# Patient Record
Sex: Female | Born: 1965 | Race: White | Hispanic: No | Marital: Married | Smoking: Never smoker
Health system: Southern US, Community
[De-identification: ages and names within clinical notes are randomized; demographics above are authoritative.]

## PROBLEM LIST (undated history)

## (undated) DIAGNOSIS — E119 Type 2 diabetes mellitus without complications: Secondary | ICD-10-CM

## (undated) DIAGNOSIS — I2089 Other forms of angina pectoris: Secondary | ICD-10-CM

## (undated) DIAGNOSIS — E785 Hyperlipidemia, unspecified: Secondary | ICD-10-CM

## (undated) DIAGNOSIS — E7800 Pure hypercholesterolemia, unspecified: Secondary | ICD-10-CM

## (undated) DIAGNOSIS — F419 Anxiety disorder, unspecified: Secondary | ICD-10-CM

## (undated) DIAGNOSIS — R079 Chest pain, unspecified: Secondary | ICD-10-CM

## (undated) DIAGNOSIS — M5412 Radiculopathy, cervical region: Secondary | ICD-10-CM

## (undated) DIAGNOSIS — Z8679 Personal history of other diseases of the circulatory system: Secondary | ICD-10-CM

## (undated) DIAGNOSIS — I208 Other forms of angina pectoris: Secondary | ICD-10-CM

## (undated) DIAGNOSIS — G459 Transient cerebral ischemic attack, unspecified: Secondary | ICD-10-CM

## (undated) DIAGNOSIS — G43909 Migraine, unspecified, not intractable, without status migrainosus: Secondary | ICD-10-CM

## (undated) HISTORY — DX: Hyperlipidemia, unspecified: E78.5

## (undated) HISTORY — DX: Chest pain, unspecified: R07.9

## (undated) HISTORY — DX: Anxiety disorder, unspecified: F41.9

## (undated) HISTORY — DX: Migraine, unspecified, not intractable, without status migrainosus: G43.909

## (undated) HISTORY — DX: Other forms of angina pectoris: I20.89

## (undated) HISTORY — PX: CHOLECYSTECTOMY: SHX55

## (undated) HISTORY — DX: Other forms of angina pectoris: I20.8

## (undated) HISTORY — DX: Transient cerebral ischemic attack, unspecified: G45.9

## (undated) HISTORY — DX: Pure hypercholesterolemia, unspecified: E78.00

## (undated) HISTORY — DX: Personal history of other diseases of the circulatory system: Z86.79

## (undated) HISTORY — DX: Radiculopathy, cervical region: M54.12

## (undated) HISTORY — PX: BUNIONECTOMY: SHX129

---

## 2019-05-17 DIAGNOSIS — I10 Essential (primary) hypertension: Secondary | ICD-10-CM | POA: Diagnosis present

## 2019-05-17 DIAGNOSIS — F419 Anxiety disorder, unspecified: Secondary | ICD-10-CM | POA: Diagnosis present

## 2020-07-25 ENCOUNTER — Other Ambulatory Visit: Payer: Self-pay

## 2020-07-25 ENCOUNTER — Encounter (HOSPITAL_COMMUNITY): Payer: Self-pay | Admitting: Emergency Medicine

## 2020-07-25 ENCOUNTER — Emergency Department (HOSPITAL_COMMUNITY)
Admission: EM | Admit: 2020-07-25 | Discharge: 2020-07-26 | Disposition: A | Discharge status: Home / Self Care | Payer: 59 | Attending: Emergency Medicine | Admitting: Emergency Medicine

## 2020-07-25 DIAGNOSIS — E119 Type 2 diabetes mellitus without complications: Secondary | ICD-10-CM | POA: Insufficient documentation

## 2020-07-25 DIAGNOSIS — R519 Headache, unspecified: Secondary | ICD-10-CM | POA: Diagnosis present

## 2020-07-25 HISTORY — DX: Type 2 diabetes mellitus without complications: E11.9

## 2020-07-25 HISTORY — DX: Transient cerebral ischemic attack, unspecified: G45.9

## 2020-07-25 LAB — COMPREHENSIVE METABOLIC PANEL
ALT: 49 U/L — ABNORMAL HIGH (ref 0–44)
AST: 27 U/L (ref 15–41)
Albumin: 4.1 g/dL (ref 3.5–5.0)
Alkaline Phosphatase: 59 U/L (ref 38–126)
Anion gap: 8 (ref 5–15)
BUN: 14 mg/dL (ref 6–20)
CO2: 27 mmol/L (ref 22–32)
Calcium: 9.8 mg/dL (ref 8.9–10.3)
Chloride: 102 mmol/L (ref 98–111)
Creatinine, Ser: 0.68 mg/dL (ref 0.44–1.00)
GFR, Estimated: >60 mL/min (ref >60)
Glucose, Bld: 110 mg/dL — ABNORMAL HIGH (ref 70–99)
Potassium: 4 mmol/L (ref 3.5–5.1)
Sodium: 137 mmol/L (ref 135–145)
Total Bilirubin: 0.5 mg/dL (ref 0.3–1.2)
Total Protein: 7.3 g/dL (ref 6.5–8.1)

## 2020-07-25 LAB — CBC WITH DIFFERENTIAL/PLATELET
Abs Immature Granulocytes: 0.04 10*3/uL (ref 0.00–0.07)
Basophils Absolute: 0 10*3/uL (ref 0.0–0.1)
Basophils Relative: 0 %
Eosinophils Absolute: 0.2 10*3/uL (ref 0.0–0.5)
Eosinophils Relative: 2 %
HCT: 43.8 % (ref 36.0–46.0)
Hemoglobin: 14.4 g/dL (ref 12.0–15.0)
Immature Granulocytes: 1 %
Lymphocytes Relative: 40 %
Lymphs Abs: 3.4 10*3/uL (ref 0.7–4.0)
MCH: 28.2 pg (ref 26.0–34.0)
MCHC: 32.9 g/dL (ref 30.0–36.0)
MCV: 85.9 fL (ref 80.0–100.0)
Monocytes Absolute: 0.5 10*3/uL (ref 0.1–1.0)
Monocytes Relative: 6 %
Neutro Abs: 4.4 10*3/uL (ref 1.7–7.7)
Neutrophils Relative %: 51 %
Platelets: 289 10*3/uL (ref 150–400)
RBC: 5.1 MIL/uL (ref 3.87–5.11)
RDW: 13.7 % (ref 11.5–15.5)
WBC: 8.5 10*3/uL (ref 4.0–10.5)
nRBC: 0 % (ref 0.0–0.2)

## 2020-07-25 MED ORDER — SODIUM CHLORIDE 0.9 % IV BOLUS
1000.0000 mL | Freq: Once | INTRAVENOUS | Status: AC
  Administered 2020-07-26: 1000 mL via INTRAVENOUS

## 2020-07-25 MED ORDER — DIPHENHYDRAMINE HCL 50 MG/ML IJ SOLN
12.5000 mg | Freq: Once | INTRAMUSCULAR | Status: AC
  Administered 2020-07-26: 12.5 mg via INTRAVENOUS
  Filled 2020-07-25: qty 1

## 2020-07-25 MED ORDER — PROCHLORPERAZINE EDISYLATE 10 MG/2ML IJ SOLN
10.0000 mg | Freq: Once | INTRAMUSCULAR | Status: AC
  Administered 2020-07-26: 10 mg via INTRAVENOUS
  Filled 2020-07-25: qty 2

## 2020-07-25 NOTE — ED Provider Notes (Signed)
Emergency Medicine Provider Triage Evaluation Note  Hailey [name] , a 55 y.o. female  was evaluated in triage.  Pt complains of 2-day history of headache, right arm paresthesias, elevated blood pressures, mild lightheadedness and intermittent blurred vision, and nausea symptoms.  She states that it does not get worse headache of her life.  Denies vomiting.  She recently moved here from [place].  She reports that she has a history of TIAs x2.  She has been followed by neurology and had reassuring work-up.  However, reports migraine history.  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  Her right arm paresthesias as a "burning" discomfort is worse with certain movements.  She reports that she was taken off of her BP meds, controlled with lifestyle modifications.   She has tried Advil at home, without significant improvement.  No fevers or chills.  Review of Systems  Positive: HA, right arm paresthesias, lightheadedness.  Negative: Vomiting, worst HA of life, recent trauma, blood thinners. Fevers or chills.  Physical Exam  BP 129/79 (BP Location: Right Arm)   Pulse 89   Temp 98.2 F (36.8 C) (Oral)   Resp 15   Ht 5\' 1"  (1.549 m)   Wt 105 kg   SpO2 96%   BMI 43.74 kg/m  Gen:   Awake, no distress   Resp:  Normal effort  MSK:   Moves extremities without difficulty  Other:  Neuro: CN II to XII grossly intact.  PERRL and EOM intact.  No facial asymmetry.  Extremity strength and sensation intact and symmetric throughout.  Movement of right arm reproduces right forearm paresthesias  Medical Decision Making  Medically screening exam initiated at 8:09 PM.  Appropriate orders placed.  Davona Kinoshita was informed that the remainder of the evaluation will be completed by another provider, this initial triage assessment does not replace that evaluation, and the importance of remaining in the ED until their evaluation is complete.  Labs. HA cocktail.  Suspect her right forearm paresthesias more related to her cervical disc herniations. States that it radiates from right shoulder.   Whitney Muse, PA-C 07/25/20 2009    07/27/20, MD 07/25/20 2032

## 2020-07-25 NOTE — ED Triage Notes (Signed)
Patient presents with multiple complaints : headache for 2 days ; intermittent numbness at right forearm this afternoon ; left lowr back pain radiating to left leg and elevated blood pressure this afternoon 165/92 .

## 2020-07-25 NOTE — ED Notes (Signed)
Pt named called for vitals and no answer

## 2020-07-26 ENCOUNTER — Emergency Department (HOSPITAL_COMMUNITY): Payer: 59

## 2020-07-26 MED ORDER — DEXAMETHASONE SODIUM PHOSPHATE 10 MG/ML IJ SOLN
10.0000 mg | Freq: Once | INTRAMUSCULAR | Status: AC
  Administered 2020-07-26: 10 mg via INTRAVENOUS
  Filled 2020-07-26: qty 1

## 2020-07-26 NOTE — ED Provider Notes (Signed)
[facility] EMERGENCY DEPARTMENT Provider Note   CSN: [id number] Arrival date & time: 07/25/20  1835     History Chief Complaint  Patient presents with   Headache    Numbness    Hailey [name] is a 55 y.o. female.   Headache Pain location:  Generalized Quality:  Dull Radiates to:  Does not radiate Severity currently:  Unable to specify Severity at highest:  Unable to specify Onset quality:  Unable to specify Timing:  Unable to specify Progression:  Unable to specify Similar to prior headaches: yes   Relieved by:  None tried Worsened by:  Nothing Ineffective treatments:  None tried     Past Medical History:  Diagnosis Date   Diabetes mellitus without complication (HCC)    TIA (transient ischemic attack)     There are no problems to display for this patient.   OB History   No obstetric history on file.     No family history on file.  Social History   Tobacco Use   Smoking status: Never  Substance Use Topics   Alcohol use: Never   Drug use: Never    Home Medications Prior to Admission medications   Not on File    Allergies    Penicillins  Review of Systems   Review of Systems  Neurological:  Positive for headaches.  All other systems reviewed and are negative.  Physical Exam Updated Vital Signs BP 128/76   Pulse 72   Temp 98.2 F (36.8 C) (Oral)   Resp 16   Ht 5\' 1"  (1.549 m)   Wt 105 kg   SpO2 97%   BMI 43.74 kg/m   Physical Exam Vitals and nursing note reviewed.  Constitutional:      Appearance: She is well-developed.  HENT:     Head: Normocephalic and atraumatic.     Nose: Nose normal. No congestion or rhinorrhea.  Cardiovascular:     Rate and Rhythm: Normal rate and regular rhythm.  Pulmonary:     Effort: No respiratory distress.     Breath sounds: No stridor.  Abdominal:     General: Abdomen is flat. There is no distension.  Musculoskeletal:        General: No swelling or tenderness. Normal range of  motion.     Cervical back: Normal range of motion.  Skin:    General: Skin is warm and dry.  Neurological:     General: No focal deficit present.     Mental Status: She is alert.    ED Results / Procedures / Treatments   Labs (all labs ordered are listed, but only abnormal results are displayed) Labs Reviewed  COMPREHENSIVE METABOLIC PANEL - Abnormal; Notable for the following components:      Result Value   Glucose, Bld 110 (*)    ALT 49 (*)    All other components within normal limits  CBC WITH DIFFERENTIAL/PLATELET    EKG None  Radiology No results found.  Procedures Procedures   Medications Ordered in ED Medications  sodium chloride 0.9 % bolus 1,000 mL (has no administration in time range)  prochlorperazine (COMPAZINE) injection 10 mg (has no administration in time range)  diphenhydrAMINE (BENADRYL) injection 12.5 mg (has no administration in time range)    ED Course  I have reviewed the triage vital signs and the nursing notes.  Pertinent labs & imaging results that were available during my care of the patient were reviewed by me and considered in my medical  decision making (see chart for details).    MDM Rules/Calculators/A&P                          Headache. Improved. Ct negative. Seems to be chronic intermittent headaches. No pcp in area. Will dc w/ list of PCP phone numbers.   Final Clinical Impression(s) / ED Diagnoses Final diagnoses:  None    Rx / DC Orders ED Discharge Orders     None        Pretty Weltman, Barbara Cower, MD 07/26/20 [phone]

## 2020-07-26 NOTE — ED Notes (Signed)
Unable to discharge patient on time. I was given two ambulance patient's at the start of my shift at 7am.

## 2020-10-11 ENCOUNTER — Ambulatory Visit (HOSPITAL_BASED_OUTPATIENT_CLINIC_OR_DEPARTMENT_OTHER): Payer: 59 | Admitting: Cardiology

## 2020-10-11 ENCOUNTER — Encounter (HOSPITAL_BASED_OUTPATIENT_CLINIC_OR_DEPARTMENT_OTHER): Payer: Self-pay | Admitting: Cardiology

## 2020-10-11 DIAGNOSIS — E7800 Pure hypercholesterolemia, unspecified: Secondary | ICD-10-CM | POA: Insufficient documentation

## 2020-10-11 DIAGNOSIS — E119 Type 2 diabetes mellitus without complications: Secondary | ICD-10-CM | POA: Insufficient documentation

## 2020-10-11 DIAGNOSIS — Z8616 Personal history of COVID-19: Secondary | ICD-10-CM

## 2020-10-11 DIAGNOSIS — Z8673 Personal history of transient ischemic attack (TIA), and cerebral infarction without residual deficits: Secondary | ICD-10-CM

## 2020-10-11 HISTORY — DX: Pure hypercholesterolemia, unspecified: E78.00

## 2020-10-11 HISTORY — DX: Type 2 diabetes mellitus without complications: E11.9

## 2020-10-11 HISTORY — DX: Personal history of transient ischemic attack (TIA), and cerebral infarction without residual deficits: Z86.73

## 2020-10-11 HISTORY — DX: Personal history of COVID-19: Z86.16

## 2020-10-19 ENCOUNTER — Encounter (HOSPITAL_BASED_OUTPATIENT_CLINIC_OR_DEPARTMENT_OTHER): Payer: Self-pay

## 2020-12-13 ENCOUNTER — Encounter: Payer: Self-pay | Admitting: Internal Medicine

## 2020-12-13 ENCOUNTER — Other Ambulatory Visit: Payer: Self-pay

## 2020-12-13 ENCOUNTER — Ambulatory Visit: Payer: 59 | Admitting: Internal Medicine

## 2020-12-13 VITALS — BP 132/70 | HR 83 | Ht 61.0 in | Wt 236.8 lb

## 2020-12-13 DIAGNOSIS — E119 Type 2 diabetes mellitus without complications: Secondary | ICD-10-CM | POA: Diagnosis not present

## 2020-12-13 DIAGNOSIS — Z79899 Other long term (current) drug therapy: Secondary | ICD-10-CM

## 2020-12-13 DIAGNOSIS — Z8673 Personal history of transient ischemic attack (TIA), and cerebral infarction without residual deficits: Secondary | ICD-10-CM

## 2020-12-13 DIAGNOSIS — E7800 Pure hypercholesterolemia, unspecified: Secondary | ICD-10-CM | POA: Diagnosis not present

## 2020-12-13 LAB — BASIC METABOLIC PANEL
BUN/Creatinine Ratio: 25 — ABNORMAL HIGH (ref 9–23)
BUN: 17 mg/dL (ref 6–24)
CO2: 27 mmol/L (ref 20–29)
Calcium: 9.8 mg/dL (ref 8.7–10.2)
Chloride: 102 mmol/L (ref 96–106)
Creatinine, Ser: 0.69 mg/dL (ref 0.57–1.00)
Glucose: 143 mg/dL — ABNORMAL HIGH (ref 70–99)
Potassium: 5.1 mmol/L (ref 3.5–5.2)
Sodium: 140 mmol/L (ref 134–144)
eGFR: 102 mL/min/{1.73_m2} (ref >59)

## 2020-12-13 LAB — HEMOGLOBIN A1C
Est. average glucose Bld gHb Est-mCnc: 148 mg/dL
Hgb A1c MFr Bld: 6.8 % — ABNORMAL HIGH (ref 4.8–5.6)

## 2020-12-13 NOTE — Patient Instructions (Signed)
Medication Instructions:  Hold Crestor and send [place] a My Chart to let us know if you are feeling any better  *If you need a refill on your cardiac medications before your next appointment, please call your pharmacy*   Lab Work: BMET and HGBA1C today   If you have labs (blood work) drawn today and your tests are completely normal, you will receive your results only by: MyChart Message (if you have MyChart) OR A paper copy in the mail If you have any lab test that is abnormal or we need to change your treatment, we will call you to review the results.   Testing/Procedures: none   Follow-Up: At [facility], you and your health needs are our priority.  As part of our continuing mission to provide you with exceptional heart care, we have created designated Provider Care Teams.  These Care Teams include your primary Cardiologist (physician) and Advanced Practice Providers (APPs -  Physician Assistants and Nurse Practitioners) who all work together to provide you with the care you need, when you need it.  We recommend signing up for the patient portal called "MyChart".  Sign up information is provided on this After Visit Summary.  MyChart is used to connect with patients for Virtual Visits (Telemedicine).  Patients are able to view lab/test results, encounter notes, upcoming appointments, etc.  Non-urgent messages can be sent to your provider as well.   To learn more about what you can do with MyChart, go to [email or web address].    Your next appointment:   6 month(s)  The format for your next appointment:   In Person  Provider:    Dr. Dietrich Pates      Other Instructions Referral To Dr. Ayesha Mohair for back and leg pain

## 2020-12-13 NOTE — Progress Notes (Signed)
Cardiology Office Note   Date:  12/13/2020   ID:  Hailey [name], DOB Feb 26, 1965, MRN [id number]  PCP:  Elita Boone, MD  Cardiologist:   Dietrich Pates, MD    Presents for continued cardiac care.  To establish as a new patient.   History of Present Illness: Hailey [name] is a 55 y.o. female with a history of chest tightness.  She previously lived in [place].  In 2019 she had chest tightness with right arm pain tingling.  She underwent a stress echo (with some [place], MD).  Stress echo was positive with anterior hypokinesis.  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  LVEF was normal.  She was placed on medical therapy of amlodipine and metoprolol.  She relieved and noted no change in her symptoms.  She moved down to [place] in 2022.  She says when she walks her dog for an extended period (a around 2 city blocks) she will develop some tightness in her chest arm tingling.  Again this is unchanged from what she is continue to have since 2019.  She slows down and it eases.  She resumes.  She denies PND.  No palpitations.  No syncope.  The patient does note some cramping in her legs at night and with standing she will get the pain.  She questions if it is due to the rosuvastatin she was placed on recently.  She had been on simvastatin prior.  Symptoms may have happened before. She was diagnosed with possible TIA.  Had some tingling on her on both arms and legs (bilateral) also had some facial numbness on the right, question tongue changes.  Again this was in [place].  None recent. She was previously on Jardiance.  She was seen by Dr. Germaine Pomfret.  Here this was discontinued and she was placed on metformin.  She is also just been placed a couple months ago on hydrochlorothiazide and her amlodipine, metoprolol were discontinued.   Diet:    Br:   Skip   or egg/egg scramble   Coffee  1/2/1/2   monk fruit Lunch:   Salad  chick salad or pizza    Chcken/fissh     Rare pasta Water   Crystal light   No soda   Occasaional etoh Snack:  Gr yogurt  Choobani grapes chips     rare ckae    Current Meds  Medication Sig   aspirin EC 81 MG tablet Take 81 mg by mouth daily. Swallow whole.   diazepam (VALIUM) 5 MG tablet Take 5 mg by mouth daily as needed.   hydrochlorothiazide (HYDRODIURIL) 25 MG tablet Take 25 mg by mouth daily.   ibuprofen (ADVIL) 800 MG tablet Take 800 mg by mouth every 8 (eight) hours as needed for headache or moderate pain.   metFORMIN (GLUCOPHAGE-XR) 500 MG 24 hr tablet Take 500 mg by mouth 2 (two) times daily.   naproxen (NAPROSYN) 500 MG tablet Take 500 mg by mouth 2 (two) times daily as needed for headache or mild pain.   rosuvastatin (CRESTOR) 5 MG tablet Take 5 mg by mouth daily.     Allergies:   Hazelnut (filbert) allergy skin test, Penicillins, and Coconut (cocos nucifera) allergy skin test   Past Medical History:  Diagnosis Date   Angina at rest [facility])    Anxiety    Chest pain    Diabetes mellitus without complication (HCC)    High  cholesterol    High cholesterol    History of angina    History of TIA (transient ischemic attack) 10/11/2020   Hypercholesterolemia 10/11/2020   Hyperlipidemia    Migraine    Personal history of COVID-19 10/11/2020   Radiculopathy of cervical region    TIA (transient ischemic attack)    Transient cerebral ischemic attack    Type II diabetes mellitus (HCC) 10/11/2020    Social History:  The patient  reports that she has never smoked. She has never used smokeless tobacco. She reports that she does not drink alcohol and does not use drugs.   Family History:  The patient's family history is not on file.    ROS:  Please see the history of present illness. All other systems are reviewed and  Negative to the above problem except as noted.    PHYSICAL EXAM: VS:  BP 132/70 (BP Location: Left Arm, Patient Position: Sitting, Cuff Size: Large)   Pulse  83   Ht 5\' 1"  (1.549 m)   Wt 236 lb 12.8 oz (107.4 kg)   SpO2 94%   BMI 44.74 kg/m   GEN: Morbidly obese in no acute distress  HEENT: normal  Neck: no JVD, carotid bruits, Cardiac: RRR; no murmurs, rubs, or gallops,no edema  Respiratory:  clear to auscultation bilaterally, GI: soft, nontender, nondistended, + BS  No hepatomegaly  MS: no deformity Moving all extremities   Skin: warm and dry, no rash Neuro:  Strength and sensation are intact Psych: euthymic mood, full affect   EKG:  EKG is ordered today.  Sinus rhythm.  83 bpm.   Lipid Panel No results found for: CHOL, TRIG, HDL, CHOLHDL, VLDL, LDLCALC, LDLDIRECT    Wt Readings from Last 3 Encounters:  12/13/20 236 lb 12.8 oz (107.4 kg)  07/25/20 231 lb 7.7 oz (105 kg)      ASSESSMENT AND PLAN:  1.  Chest tightness.  Patient has exertional symptoms that have been stable for the past several years.  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  Question if related to some diastolic dysfunction.  There has been no progression.  I would keep her on aspirin.  Okay to stay on the HydroDIURIL though I would check electrolytes.  Follow.  2 dyslipidemia.  Last lipids in August at [facility] showed an LDL of 118 triglycerides 261 HDL of 50.  Note her hemoglobin A1c was 7.1.  Discussed a keto type diet.  She had been on [place] in the past she lost 30 pounds but came off of it.  I encouraged her to resume and to keep an extremely low carbohydrate diet on that free meal.  Follow.  We will check an A1c today  3 diabetes.  As noted above.  Keep on metformin  4.  Hypertension.  Blood pressure is okay on current regimen.  Follow.  Tentative follow-up for 6 months.  I have given her referral to [facility] for back pain.  Her leg symptoms may be due to this. Current medicines are reviewed at length with the patient today.  The patient does not have concerns regarding medicines.  Signed, Terrilee Files, MD  12/13/2020 8:14 AM     [facility] [address] [place], [place], [place]  [place] Phone: [phone]; Fax: [phone]

## 2020-12-22 ENCOUNTER — Encounter: Payer: Self-pay | Admitting: Internal Medicine

## 2021-01-24 NOTE — Progress Notes (Signed)
[name] [address] [place] [address] Phone: [phone] Subjective:   Hailey [name], am serving as a scribe for Dr. Antoine Primas. This visit occurred during the SARS-CoV-2 public health emergency.  Safety protocols were in place, including screening questions prior to the visit, additional usage of staff PPE, and extensive cleaning of exam room while observing appropriate contact time as indicated for disinfecting solutions.   I'm seeing this patient by the request  of:  Dr. Dietrich Pates  CC: leg pain, history of back pain   FGH:[id number]  Hailey [name] is a 55 y.o. female coming in with complaint of back and leg pain. Workers comp. Accident at work bulging and herniated disc. Moved here from [place] 8 months. Was seeing chiropractor and pain management. Doesn't like gabapentin. Stopped taking Crestor because of pain in legs. Pain in back and neck has gotten a little better. Pain in legs mid thigh to calf bilateral, numbness tingling and burning in nature. All pain is constant. Naproxen and Ibuprofen helps sometimes.       Past Medical History:  Diagnosis Date   Angina at rest [facility])    Anxiety    Chest pain    Diabetes mellitus without complication (HCC)    High cholesterol    High cholesterol    History of angina    History of TIA (transient ischemic attack) 10/11/2020   Hypercholesterolemia 10/11/2020   Hyperlipidemia    Migraine    Personal history of COVID-19 10/11/2020   Radiculopathy of cervical region    TIA (transient ischemic attack)    Transient cerebral ischemic attack    Type II diabetes mellitus (HCC) 10/11/2020    Social History   Socioeconomic History   Marital status: Married    Spouse name: Not on file   Number of children: Not on file   Years of education: Not on file   Highest education level: Not on file  Occupational History   Not on file  Tobacco Use   Smoking status: Never   Smokeless tobacco: Never   Substance and Sexual Activity   Alcohol use: Never   Drug use: Never   Sexual activity: Not on file  Other Topics Concern   Not on file  Social History Narrative   Not on file   Social Determinants of Health   Financial Resource Strain: Not on file  Food Insecurity: Not on file  Transportation Needs: Not on file  Physical Activity: Not on file  Stress: Not on file  Social Connections: Not on file   Allergies  Allergen Reactions   Hazelnut Theodosia Paling) Allergy Skin Test Anaphylaxis   Penicillins Anaphylaxis   Coconut (Cocos Nucifera) Allergy Skin Test    No family history on file.  Current Outpatient Medications (Endocrine & Metabolic):    empagliflozin (JARDIANCE) 10 MG TABS tablet, Take 10 mg by mouth daily. (Patient not taking: Reported on 12/13/2020)   metFORMIN (GLUCOPHAGE-XR) 500 MG 24 hr tablet, Take 500 mg by mouth 2 (two) times daily.  Current Outpatient Medications (Cardiovascular):    amLODipine (NORVASC) 5 MG tablet, Take 5 mg by mouth daily. (Patient not taking: Reported on 12/13/2020)   hydrochlorothiazide (HYDRODIURIL) 25 MG tablet, Take 25 mg by mouth daily.   metoprolol succinate (TOPROL-XL) 25 MG 24 hr tablet, Take 25 mg by mouth daily. (Patient not taking: Reported on 12/13/2020)   rosuvastatin (CRESTOR) 5 MG tablet, Take 5 mg by mouth daily.   simvastatin (ZOCOR) 20 MG  tablet, Take 20 mg by mouth daily. (Patient not taking: Reported on 12/13/2020)   Current Outpatient Medications (Analgesics):    meloxicam (MOBIC) 15 MG tablet, Take 1 tablet (15 mg total) by mouth daily.   aspirin EC 81 MG tablet, Take 81 mg by mouth daily. Swallow whole.   ibuprofen (ADVIL) 800 MG tablet, Take 800 mg by mouth every 8 (eight) hours as needed for headache or moderate pain.   naproxen (NAPROSYN) 500 MG tablet, Take 500 mg by mouth 2 (two) times daily as needed for headache or mild pain.   Current Outpatient Medications (Other):    tiZANidine (ZANAFLEX) 2 MG tablet, Take 1  tablet (2 mg total) by mouth at bedtime.   busPIRone (BUSPAR) 5 MG tablet, Take 5 mg by mouth daily as needed. (Patient not taking: Reported on 12/13/2020)   diazepam (VALIUM) 5 MG tablet, Take 5 mg by mouth daily as needed.   gabapentin (NEURONTIN) 300 MG capsule, Take 300 mg by mouth 3 (three) times daily. (Patient not taking: Reported on 12/13/2020)   Reviewed prior external information including notes and imaging from  primary care provider As well as notes that were available from care everywhere and other healthcare systems.  Past medical history, social, surgical and family history all reviewed in electronic medical record.  No pertanent information unless stated regarding to the chief complaint.   Review of Systems:  No headache, visual changes, nausea, vomiting, diarrhea, constipation, dizziness, abdominal pain, skin rash, fevers, chills, night sweats, weight loss, swollen lymph nodes, body aches, joint swelling, chest pain, shortness of breath, mood changes. POSITIVE muscle aches  Objective  Blood pressure 110/72, pulse 95, height 5\' 1"  (1.549 m), weight 232 lb (105.2 kg), SpO2 95 %.   General: No apparent distress alert and oriented x3 mood and affect normal, dressed appropriately.  HEENT: Pupils equal, extraocular movements intact  Respiratory: Patient's speak in full sentences and does not appear short of breath  Cardiovascular: No lower extremity edema, non tender, no erythema  Gait normal with good balance and coordination.  MSK: Patient does have loss of lordosis of the cervical spine.  Patient does have tightness right better than left.  Negative Spurling's.  Patient's low back exam does have some loss of lordosis as well.  Patient is unable to do [place] secondary to pain and body habitus.  Patient has negative straight leg testing but does have significant tightness of the hamstrings bilaterally.  Osteopathic findings   C6 flexed rotated and side bent left T3 extended  rotated and side bent right inhaled third rib T6 extended rotated and side bent left L4 flexed rotated and side bent left Sacrum right on right  [id number]; 15 additional minutes spent for Therapeutic exercises as stated in above notes.  This included exercises focusing on stretching, strengthening, with significant focus on eccentric aspects.   Long term goals include an improvement in range of motion, strength, endurance as well as avoiding reinjury. Patient's frequency would include in 1-2 times a day, 3-5 times a week for a duration of 6-12 weeks.  Low back exercises that included:  Pelvic tilt/bracing instruction to focus on control of the pelvic girdle and lower abdominal muscles  Glute strengthening exercises, focusing on proper firing of the glutes without engaging the low back muscles Proper stretching techniques for maximum relief for the hamstrings, hip flexors, low back and some rotation where tolerated Proper technique shown and discussed handout in great detail with ATC.  All questions were discussed and  answered.      Impression and Recommendations:     The above documentation has been reviewed and is accurate and complete Judi Saa, DO

## 2021-01-29 ENCOUNTER — Other Ambulatory Visit: Payer: Self-pay

## 2021-01-29 ENCOUNTER — Ambulatory Visit (INDEPENDENT_AMBULATORY_CARE_PROVIDER_SITE_OTHER): Payer: 59

## 2021-01-29 ENCOUNTER — Ambulatory Visit (INDEPENDENT_AMBULATORY_CARE_PROVIDER_SITE_OTHER): Payer: 59 | Admitting: Family Medicine

## 2021-01-29 VITALS — BP 110/72 | HR 95 | Ht 61.0 in | Wt 232.0 lb

## 2021-01-29 DIAGNOSIS — M9903 Segmental and somatic dysfunction of lumbar region: Secondary | ICD-10-CM | POA: Diagnosis not present

## 2021-01-29 DIAGNOSIS — M549 Dorsalgia, unspecified: Secondary | ICD-10-CM

## 2021-01-29 DIAGNOSIS — M5030 Other cervical disc degeneration, unspecified cervical region: Secondary | ICD-10-CM | POA: Diagnosis not present

## 2021-01-29 DIAGNOSIS — M9901 Segmental and somatic dysfunction of cervical region: Secondary | ICD-10-CM

## 2021-01-29 DIAGNOSIS — M51369 Other intervertebral disc degeneration, lumbar region without mention of lumbar back pain or lower extremity pain: Secondary | ICD-10-CM | POA: Insufficient documentation

## 2021-01-29 DIAGNOSIS — M5136 Other intervertebral disc degeneration, lumbar region: Secondary | ICD-10-CM | POA: Diagnosis not present

## 2021-01-29 DIAGNOSIS — M9902 Segmental and somatic dysfunction of thoracic region: Secondary | ICD-10-CM

## 2021-01-29 DIAGNOSIS — M9904 Segmental and somatic dysfunction of sacral region: Secondary | ICD-10-CM

## 2021-01-29 MED ORDER — MELOXICAM 15 MG PO TABS: 15.0000 mg | ORAL_TABLET | Freq: Every day | ORAL | 0 refills | Status: DC

## 2021-01-29 MED ORDER — TIZANIDINE HCL 2 MG PO TABS: 2.0000 mg | ORAL_TABLET | Freq: Every day | ORAL | 0 refills | Status: DC

## 2021-01-29 NOTE — Assessment & Plan Note (Signed)
Patient did have a accident when she was working in 2019 in [place].  Patient did have an MRI and I do have the report.  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  We will be downloading the notes.  Patient did not have anything other than chiropractic care.  Attempted osteopathic manipulation.  Hopefully this will be beneficial.  Discussed the importance of core strengthening and weight loss.  Patient given medications including meloxicam and encouraged her not to take the ibuprofen with it.  We also discussed the Zanaflex.  Nighttime amounts that could be beneficial as well.  Patient will try this as well as home exercises given by athletic trainer today.  Worsening pain will consider the possibility of formal physical therapy or repeating advanced imaging.

## 2021-01-29 NOTE — Assessment & Plan Note (Signed)
As stated above.  Arthritic changes.  Responded well to manipulation.  No radicular symptoms and patient wants to avoid getting back on the gabapentin where patient was doing 3000 mg a day.  Discussed with patient about icing regimen and home exercise, discussed which activities to do and which ones to avoid.  Increase activity slowly.  Follow-up again in 6 to 8 weeks

## 2021-01-29 NOTE — Patient Instructions (Addendum)
Do prescribed exercises at least 3x a week Meloxicam 15 daily for 5 day at a time Zanaflex 2mg  at night See you again in 4-6 weeks Vitamin D3 2000IU daily   Tart cherry 1200mg  extract any dose at night

## 2021-01-29 NOTE — Assessment & Plan Note (Signed)

## 2021-02-26 NOTE — Progress Notes (Signed)
[name] [address] [place] [address] Phone: [phone] Subjective:   Hailey [name], am serving as a scribe for Dr. Antoine Primas. This visit occurred during the SARS-CoV-2 public health emergency.  Safety protocols were in place, including screening questions prior to the visit, additional usage of staff PPE, and extensive cleaning of exam room while observing appropriate contact time as indicated for disinfecting solutions.   I'm seeing this patient by the request  of:  Farris Has, MD  CC:   [id number]  01/29/2021 Patient did have a accident when she was working in 2019 in [place].  Patient did have an MRI and I do have the report.  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  We will be downloading the notes.  Patient did not have anything other than chiropractic care.  Attempted osteopathic manipulation.  Hopefully this will be beneficial.  Discussed the importance of core strengthening and weight loss.  Patient given medications including meloxicam and encouraged her not to take the ibuprofen with it.  We also discussed the Zanaflex.  Nighttime amounts that could be beneficial as well.  Patient will try this as well as home exercises given by athletic trainer today.  Worsening pain will consider the possibility of formal physical therapy or repeating advanced imaging.  As stated above.  Arthritic changes.  Responded well to manipulation.  No radicular symptoms and patient wants to avoid getting back on the gabapentin where patient was doing 3000 mg a day.  Discussed with patient about icing regimen and home exercise, discussed which activities to do and which ones to avoid.  Increase activity slowly.  Follow-up again in 6 to 8 weeks  Updated 02/27/2021 Hailey [name] is a 56 y.o. female coming in with complaint of back pain.  Patient was given home exercises and we did attempt  osteopathic manipulation.  Patient was to continue to decrease her gabapentin but we did discuss the possibility of Zanaflex.  Patient states better since the last visit. Pilate's helped. Only having pain in right calf.  Xray IMPRESSION: Lumbar spine 1. Mild multilevel degenerative disc disease.  IMPRESSION: Cervical spine 1. Mild multilevel degenerative disc disease.   Patient still states that this pain likely came from an accident in 2019.    Past Medical History:  Diagnosis Date   Angina at rest [facility])    Anxiety    Chest pain    Diabetes mellitus without complication (HCC)    High cholesterol    High cholesterol    History of angina    History of TIA (transient ischemic attack) 10/11/2020   Hypercholesterolemia 10/11/2020   Hyperlipidemia    Migraine    Personal history of COVID-19 10/11/2020   Radiculopathy of cervical region    TIA (transient ischemic attack)    Transient cerebral ischemic attack    Type II diabetes mellitus (HCC) 10/11/2020    Social History   Socioeconomic History   Marital status: Married    Spouse name: Not on file   Number of children: Not on file   Years of education: Not on file   Highest education level: Not on file  Occupational History   Not on file  Tobacco Use   Smoking status: Never   Smokeless tobacco: Never  Substance and Sexual Activity   Alcohol use: Never   Drug use: Never   Sexual activity: Not on file  Other Topics  Concern   Not on file  Social History Narrative   Not on file   Social Determinants of Health   Financial Resource Strain: Not on file  Food Insecurity: Not on file  Transportation Needs: Not on file  Physical Activity: Not on file  Stress: Not on file  Social Connections: Not on file   Allergies  Allergen Reactions   Hazelnut [facility]) Allergy Skin Test Anaphylaxis   Penicillins Anaphylaxis   Coconut (Cocos Nucifera) Allergy Skin Test    No family history on file.  Current Outpatient  Medications (Endocrine & Metabolic):    empagliflozin (JARDIANCE) 10 MG TABS tablet, Take 10 mg by mouth daily. (Patient not taking: Reported on 12/13/2020)   metFORMIN (GLUCOPHAGE-XR) 500 MG 24 hr tablet, Take 500 mg by mouth 2 (two) times daily.  Current Outpatient Medications (Cardiovascular):    amLODipine (NORVASC) 5 MG tablet, Take 5 mg by mouth daily. (Patient not taking: Reported on 12/13/2020)   hydrochlorothiazide (HYDRODIURIL) 25 MG tablet, Take 25 mg by mouth daily.   metoprolol succinate (TOPROL-XL) 25 MG 24 hr tablet, Take 25 mg by mouth daily. (Patient not taking: Reported on 12/13/2020)   rosuvastatin (CRESTOR) 5 MG tablet, Take 5 mg by mouth daily.   simvastatin (ZOCOR) 20 MG tablet, Take 20 mg by mouth daily. (Patient not taking: Reported on 12/13/2020)   Current Outpatient Medications (Analgesics):    aspirin EC 81 MG tablet, Take 81 mg by mouth daily. Swallow whole.   ibuprofen (ADVIL) 800 MG tablet, Take 800 mg by mouth every 8 (eight) hours as needed for headache or moderate pain.   meloxicam (MOBIC) 15 MG tablet, Take 1 tablet (15 mg total) by mouth daily.   naproxen (NAPROSYN) 500 MG tablet, Take 500 mg by mouth 2 (two) times daily as needed for headache or mild pain.   Current Outpatient Medications (Other):    busPIRone (BUSPAR) 5 MG tablet, Take 5 mg by mouth daily as needed. (Patient not taking: Reported on 12/13/2020)   diazepam (VALIUM) 5 MG tablet, Take 5 mg by mouth daily as needed.   gabapentin (NEURONTIN) 300 MG capsule, Take 300 mg by mouth 3 (three) times daily. (Patient not taking: Reported on 12/13/2020)   tiZANidine (ZANAFLEX) 2 MG tablet, Take 1 tablet (2 mg total) by mouth at bedtime.   Reviewed prior external information including notes and imaging from  primary care provider As well as notes that were available from care everywhere and other healthcare systems.  Past medical history, social, surgical and family history all reviewed in  electronic medical record.  No pertanent information unless stated regarding to the chief complaint.   Review of Systems:  No headache, visual changes, nausea, vomiting, diarrhea, constipation, dizziness, abdominal pain, skin rash, fevers, chills, night sweats, weight loss, swollen lymph nodes, body aches, joint swelling, chest pain, shortness of breath, mood changes. POSITIVE muscle aches  Objective  Blood pressure 124/76, height 5\' 1"  (1.549 m), weight 231 lb (104.8 kg).   General: No apparent distress alert and oriented x3 mood and affect normal, dressed appropriately.  HEENT: Pupils equal, extraocular movements intact  Respiratory: Patient's speak in full sentences and does not appear short of breath  Cardiovascular: No lower extremity edema, non tender, no erythema  Gait normal with good balance and coordination.  MSK: Low back exam does have some loss of lordosis.  Some tenderness to palpation in the paraspinal musculature.  Patient does have tightness noted diffusely from the low back all the way  up to the neck as well.  Patient does have tightness with straight leg test but no true radicular symptoms at the moment.     Osteopathic findings C4 flexed rotated and side bent left C6 flexed rotated and side bent left T3 extended rotated and side bent right inhaled third rib T6 extended rotated and side bent left L2 flexed rotated and side bent right Sacrum right on right  Impression and Recommendations:     The above documentation has been reviewed and is accurate and complete Judi SaaZachary M Suzzette Gasparro, DO

## 2021-02-27 ENCOUNTER — Other Ambulatory Visit: Payer: Self-pay

## 2021-02-27 ENCOUNTER — Ambulatory Visit: Payer: 59 | Admitting: Family Medicine

## 2021-02-27 VITALS — BP 124/76 | Ht 61.0 in | Wt 231.0 lb

## 2021-02-27 DIAGNOSIS — M9901 Segmental and somatic dysfunction of cervical region: Secondary | ICD-10-CM

## 2021-02-27 DIAGNOSIS — M9902 Segmental and somatic dysfunction of thoracic region: Secondary | ICD-10-CM

## 2021-02-27 DIAGNOSIS — M9904 Segmental and somatic dysfunction of sacral region: Secondary | ICD-10-CM | POA: Diagnosis not present

## 2021-02-27 DIAGNOSIS — M5136 Other intervertebral disc degeneration, lumbar region: Secondary | ICD-10-CM

## 2021-02-27 DIAGNOSIS — M9903 Segmental and somatic dysfunction of lumbar region: Secondary | ICD-10-CM

## 2021-02-27 DIAGNOSIS — M9908 Segmental and somatic dysfunction of rib cage: Secondary | ICD-10-CM

## 2021-02-27 NOTE — Patient Instructions (Signed)
Lets not making any changes We are making improvement See you again in 5-6 weeks

## 2021-02-27 NOTE — Assessment & Plan Note (Signed)

## 2021-02-27 NOTE — Assessment & Plan Note (Signed)
Chronic problem with exacerbation.  Discussed with patient about icing regimen and home exercises.  Patient is taking the meloxicam.  We also discussed with patient about the Zanaflex.  Could potentially increase if needed.  States that she is making some improvement overall though.  Patient will follow up again in 6 to 8 weeks otherwise.

## 2021-02-28 ENCOUNTER — Other Ambulatory Visit: Payer: Self-pay | Admitting: Family Medicine

## 2021-03-03 ENCOUNTER — Encounter: Payer: Self-pay | Admitting: Family Medicine

## 2021-03-13 ENCOUNTER — Other Ambulatory Visit: Payer: Self-pay

## 2021-03-13 DIAGNOSIS — M545 Low back pain, unspecified: Secondary | ICD-10-CM

## 2021-03-13 DIAGNOSIS — M25551 Pain in right hip: Secondary | ICD-10-CM

## 2021-03-27 ENCOUNTER — Other Ambulatory Visit: Payer: Self-pay

## 2021-03-27 ENCOUNTER — Ambulatory Visit
Admission: RE | Admit: 2021-03-27 | Discharge: 2021-03-27 | Disposition: A | Discharge status: Home / Self Care | Payer: 59 | Source: Ambulatory Visit | Attending: Family Medicine | Admitting: Family Medicine

## 2021-03-27 DIAGNOSIS — M25551 Pain in right hip: Secondary | ICD-10-CM

## 2021-03-27 DIAGNOSIS — M25552 Pain in left hip: Secondary | ICD-10-CM

## 2021-03-27 DIAGNOSIS — M545 Low back pain, unspecified: Secondary | ICD-10-CM

## 2021-03-28 ENCOUNTER — Encounter: Payer: Self-pay | Admitting: Family Medicine

## 2021-03-31 ENCOUNTER — Encounter: Payer: Self-pay | Admitting: Internal Medicine

## 2021-04-01 NOTE — Telephone Encounter (Signed)
Reviewed photos. L leg appears more swollen    I don't see scab on image of full leg    Order L leg doppler to r/o DVT

## 2021-04-02 ENCOUNTER — Other Ambulatory Visit: Payer: Self-pay | Admitting: Family Medicine

## 2021-04-03 ENCOUNTER — Encounter: Payer: Self-pay | Admitting: Family Medicine

## 2021-05-07 NOTE — Progress Notes (Signed)
?Charlann Boxer D.O. ?[facility] ?[place] ?Phone: [phone] ?Subjective:   ?I, Hailey [name], am serving as a scribe for Dr. Hulan Saas. ? ?This visit occurred during the SARS-CoV-2 public health emergency.  Safety protocols were in place, including screening questions prior to the visit, additional usage of staff PPE, and extensive cleaning of exam room while observing appropriate contact time as indicated for disinfecting solutions.  ? ?I'm seeing this patient by the request  of:  London Pepper, MD ? ?CC: back and neck pain  ? ?[id number]  ?Hailey [name] is a 56 y.o. female coming in with complaint of back and neck pain. OMT on 02/27/2021. MRI f/u. Patient states that her R hip is worse after climbing stairs at the [place] [place]. Patient sleeps on her side and is having a hard time sleeping. Taking Meloxicam. Does have pain in L hip but not as bad as R side. Would like to discuss etiology of injury.  ? ?MRI IMPRESSION: ?Mild degenerative changes of the lumbar spine, more pronounced at ?the level of the facet joints at L3-4, L4-5 and L5-S1. No high-grade ?spinal canal or neural foraminal stenosis at any level. ? ?IMPRESSION: ?1. Advanced tendinosis of the bilateral gluteus minimus tendons with ?high-grade insertional tear of the right gluteus minimus tendon and ?low-grade interstitial tear of the left gluteus minimus. ?2. Mild tendinosis of the bilateral gluteus medius tendons, each ?with low-grade insertional tears. ?3. No acute osseous abnormality or significant arthropathy of the ?bilateral hips. ? ?Medications patient has been prescribed: Zanaflex meloxicam ? ? ? ?  ? ? ?Reviewed prior external information including notes and imaging from previsou exam, outside providers and external EMR if available.  ? ?As well as notes that were available from care everywhere and other healthcare systems. ? ?Past medical history, social, surgical and family history all reviewed in  electronic medical record.  No pertanent information unless stated regarding to the chief complaint.  ? ?Past Medical History:  ?Diagnosis Date  ? Angina at rest [facility])   ? Anxiety   ? Chest pain   ? Diabetes mellitus without complication ([place])   ? High cholesterol   ? High cholesterol   ? History of angina   ? History of TIA (transient ischemic attack) 10/11/2020  ? Hypercholesterolemia 10/11/2020  ? Hyperlipidemia   ? Migraine   ? Personal history of COVID-19 10/11/2020  ? Radiculopathy of cervical region   ? TIA (transient ischemic attack)   ? Transient cerebral ischemic attack   ? Type II diabetes mellitus ([place]) 10/11/2020  ?  ?Allergies  ?Allergen Reactions  ? Hazelnut [facility]) Allergy Skin Test Anaphylaxis  ? Penicillins Anaphylaxis  ? Coconut (Cocos Nucifera) Allergy Skin Test   ? ? ? ?Review of Systems: ? No headache, visual changes, nausea, vomiting, diarrhea, constipation, dizziness, abdominal pain, skin rash, fevers, chills, night sweats, weight loss, swollen lymph nodes, body aches, joint swelling, chest pain, shortness of breath, mood changes. POSITIVE muscle aches ? ?Objective  ?Blood pressure 116/84, pulse (!) 102, height 5\' 1"  (1.549 m), weight 232 lb (105.2 kg), SpO2 98 %. ?  ?General: No apparent distress alert and oriented x3 mood and affect normal, dressed appropriately.  ?HEENT: Pupils equal, extraocular movements intact  ?Respiratory: Patient's speak in full sentences and does not appear short of breath  ?Cardiovascular: No lower extremity edema, non tender, no erythema  ?Low back exam does have loss of lordosis.  Tenderness to palpation in the paraspinal musculature.  Tightness noted with right [place]y Sox compared to the left side.  Weakness of hip abductors but seems to be bilateral. ? ?Osteopathic findings ? ?C2 flexed rotated and side bent right ?C5 flexed rotated and side bent left ?T3 extended rotated and side bent right inhaled rib ?T8 extended rotated and side bent left ?L2 flexed rotated  and side bent right ?Sacrum right on right ? ? ? ? ?  ?Assessment and Plan: ? ?Degenerative disc disease, lumbar ?Patient does have some degenerative disc disease but mostly of the facet arthropathy.  Discussed the potential for facet injections.  Patient also does still have gluteal tears noted on the right aspect more than the left.  We discussed with patient that there is a possibility of injections or PRP.  Patient would like to consider the PRP but is wanting to see how she responds to the higher dose of the ibuprofen.  Warned of potential side effects and not to take with other anti-inflammatories.  Increase activity as tolerated.  Attempted osteopathic manipulation again and follow-up again in 6 weeks ? ?Tear of gluteus minimus tendon, right, initial encounter ?Discussed with patient at great length about the possibility of PRP.  Patient is going to consider it but does have a fear of needles. ?  ? ?Nonallopathic problems ? ?Decision today to treat with OMT was based on Physical Exam ? ?After verbal consent patient was treated with HVLA, ME, FPR techniques in cervical, rib, thoracic, lumbar, and sacral  areas ? ?Patient tolerated the procedure well with improvement in symptoms ? ?Patient given exercises, stretches and lifestyle modifications ? ?See medications in patient instructions if given ? ?Patient will follow up in 4-8 weeks ? ?  ? ?The above documentation has been reviewed and is accurate and complete Lyndal Pulley, DO ? ? ? ?  ? ? Note: This dictation was prepared with Dragon dictation along with smaller phrase technology. Any transcriptional errors that result from this process are unintentional.    ?  ?  ? ?

## 2021-05-08 ENCOUNTER — Encounter: Payer: Self-pay | Admitting: Family Medicine

## 2021-05-08 ENCOUNTER — Ambulatory Visit (INDEPENDENT_AMBULATORY_CARE_PROVIDER_SITE_OTHER): Payer: 59 | Admitting: Family Medicine

## 2021-05-08 VITALS — BP 116/84 | HR 102 | Ht 61.0 in | Wt 232.0 lb

## 2021-05-08 DIAGNOSIS — M9908 Segmental and somatic dysfunction of rib cage: Secondary | ICD-10-CM | POA: Diagnosis not present

## 2021-05-08 DIAGNOSIS — M9901 Segmental and somatic dysfunction of cervical region: Secondary | ICD-10-CM | POA: Diagnosis not present

## 2021-05-08 DIAGNOSIS — M9902 Segmental and somatic dysfunction of thoracic region: Secondary | ICD-10-CM | POA: Diagnosis not present

## 2021-05-08 DIAGNOSIS — M9903 Segmental and somatic dysfunction of lumbar region: Secondary | ICD-10-CM

## 2021-05-08 DIAGNOSIS — S76011A Strain of muscle, fascia and tendon of right hip, initial encounter: Secondary | ICD-10-CM | POA: Diagnosis not present

## 2021-05-08 DIAGNOSIS — M9904 Segmental and somatic dysfunction of sacral region: Secondary | ICD-10-CM | POA: Diagnosis not present

## 2021-05-08 DIAGNOSIS — M5136 Other intervertebral disc degeneration, lumbar region: Secondary | ICD-10-CM | POA: Diagnosis not present

## 2021-05-08 NOTE — Patient Instructions (Signed)
PRP read about it ?Glute exercises ?See me again in 6-8 weeks ?If you want PRP earlier please write [place] ?

## 2021-05-09 ENCOUNTER — Other Ambulatory Visit: Payer: Self-pay

## 2021-05-09 DIAGNOSIS — S76011A Strain of muscle, fascia and tendon of right hip, initial encounter: Secondary | ICD-10-CM | POA: Insufficient documentation

## 2021-05-09 MED ORDER — IBUPROFEN 800 MG PO TABS: 800.0000 mg | ORAL_TABLET | Freq: Three times a day (TID) | ORAL | 1 refills | Status: DC | PRN

## 2021-05-09 NOTE — Assessment & Plan Note (Signed)
Discussed with patient at great length about the possibility of PRP.  Patient is going to consider it but does have a fear of needles. ?

## 2021-05-09 NOTE — Assessment & Plan Note (Signed)
Patient does have some degenerative disc disease but mostly of the facet arthropathy.  Discussed the potential for facet injections.  Patient also does still have gluteal tears noted on the right aspect more than the left.  We discussed with patient that there is a possibility of injections or PRP.  Patient would like to consider the PRP but is wanting to see how she responds to the higher dose of the ibuprofen.  Warned of potential side effects and not to take with other anti-inflammatories.  Increase activity as tolerated.  Attempted osteopathic manipulation again and follow-up again in 6 weeks ?

## 2021-06-25 NOTE — Progress Notes (Deleted)
[name] [place] Phone: [phone] Subjective:    I'm seeing this patient by the request  of:  London Pepper, MD  CC:   [id number]  Hailey [name] is a 56 y.o. female coming in with complaint of back and neck pain. OMT on 05/08/2021. Patient states   Medications patient has been prescribed: Advil  Taking:         Reviewed prior external information including notes and imaging from previsou exam, outside providers and external EMR if available.   As well as notes that were available from care everywhere and other healthcare systems.  Past medical history, social, surgical and family history all reviewed in electronic medical record.  No pertanent information unless stated regarding to the chief complaint.   Past Medical History:  Diagnosis Date   Angina at rest [facility])    Anxiety    Chest pain    Diabetes mellitus without complication (HCC)    High cholesterol    High cholesterol    History of angina    History of TIA (transient ischemic attack) 10/11/2020   Hypercholesterolemia 10/11/2020   Hyperlipidemia    Migraine    Personal history of COVID-19 10/11/2020   Radiculopathy of cervical region    TIA (transient ischemic attack)    Transient cerebral ischemic attack    Type II diabetes mellitus ([place]) 10/11/2020    Allergies  Allergen Reactions   Hazelnut ([place]) Allergy Skin Test Anaphylaxis   Penicillins Anaphylaxis   Cocos Nucifera      Review of Systems:  No headache, visual changes, nausea, vomiting, diarrhea, constipation, dizziness, abdominal pain, skin rash, fevers, chills, night sweats, weight loss, swollen lymph nodes, body aches, joint swelling, chest pain, shortness of breath, mood changes. POSITIVE muscle aches  Objective  There were no vitals taken for this visit.   General: No apparent distress alert and oriented x3 mood and affect normal, dressed appropriately.  HEENT:  Pupils equal, extraocular movements intact  Respiratory: Patient's speak in full sentences and does not appear short of breath  Cardiovascular: No lower extremity edema, non tender, no erythema  Neuro: Cranial nerves II through XII are intact, neurovascularly intact in all extremities with 2+ DTRs and 2+ pulses.  Gait normal with good balance and coordination.  MSK:  Non tender with full range of motion and good stability and symmetric strength and tone of shoulders, elbows, wrist, hip, knee and ankles bilaterally.  Back - Normal skin, Spine with normal alignment and no deformity.  No tenderness to vertebral process palpation.  Paraspinous muscles are not tender and without spasm.   Range of motion is full at neck and lumbar sacral regions  Osteopathic findings  C2 flexed rotated and side bent right C6 flexed rotated and side bent left T3 extended rotated and side bent right inhaled rib T9 extended rotated and side bent left L2 flexed rotated and side bent right Sacrum right on right       Assessment and Plan:    Nonallopathic problems  Decision today to treat with OMT was based on Physical Exam  After verbal consent patient was treated with HVLA, ME, FPR techniques in cervical, rib, thoracic, lumbar, and sacral  areas  Patient tolerated the procedure well with improvement in symptoms  Patient given exercises, stretches and lifestyle modifications  See medications in patient instructions if given  Patient will follow up in 4-8 weeks      The above documentation has  been reviewed and is accurate and complete Delsa Sale       Note: This dictation was prepared with Dragon dictation along with smaller phrase technology. Any transcriptional errors that result from this process are unintentional.

## 2021-06-26 ENCOUNTER — Ambulatory Visit: Payer: 59 | Admitting: Family Medicine

## 2021-07-26 ENCOUNTER — Ambulatory Visit: Payer: 59 | Admitting: Internal Medicine

## 2021-07-26 ENCOUNTER — Encounter: Payer: Self-pay | Admitting: Internal Medicine

## 2021-07-26 VITALS — BP 110/78 | HR 74 | Ht 61.0 in | Wt 222.0 lb

## 2021-07-26 DIAGNOSIS — I10 Essential (primary) hypertension: Secondary | ICD-10-CM

## 2021-07-26 MED ORDER — NITROGLYCERIN 0.4 MG SL SUBL: 0.4000 mg | SUBLINGUAL_TABLET | SUBLINGUAL | 3 refills | Status: DC | PRN

## 2021-07-29 ENCOUNTER — Encounter: Payer: Self-pay | Admitting: Internal Medicine

## 2021-07-29 DIAGNOSIS — E119 Type 2 diabetes mellitus without complications: Secondary | ICD-10-CM

## 2021-07-29 DIAGNOSIS — I10 Essential (primary) hypertension: Secondary | ICD-10-CM

## 2021-07-29 DIAGNOSIS — Z79899 Other long term (current) drug therapy: Secondary | ICD-10-CM

## 2021-07-29 DIAGNOSIS — E7800 Pure hypercholesterolemia, unspecified: Secondary | ICD-10-CM

## 2021-07-30 ENCOUNTER — Other Ambulatory Visit: Payer: Self-pay | Admitting: Family Medicine

## 2021-07-30 DIAGNOSIS — Z1231 Encounter for screening mammogram for malignant neoplasm of breast: Secondary | ICD-10-CM

## 2021-07-30 NOTE — Telephone Encounter (Signed)
Pt labs in [place].

## 2021-07-31 NOTE — Telephone Encounter (Signed)
Labs printed from [place] and given to Dewayne Hatch, RN for Dr. Tenny Craw to review.

## 2021-08-01 ENCOUNTER — Telehealth: Payer: Self-pay | Admitting: Internal Medicine

## 2021-08-01 MED ORDER — AMLODIPINE BESYLATE 5 MG PO TABS: 5.0000 mg | ORAL_TABLET | Freq: Every day | ORAL | 3 refills | Status: DC

## 2021-08-01 MED ORDER — ROSUVASTATIN CALCIUM 10 MG PO TABS: 10.0000 mg | ORAL_TABLET | Freq: Every day | ORAL | 3 refills | Status: DC

## 2021-08-01 NOTE — Telephone Encounter (Signed)
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

## 2021-08-01 NOTE — Progress Notes (Signed)
[name] [address] [place] [address] Phone: [phone] Subjective:   Hailey [name], am serving as a scribe for Dr. Antoine Primas.   I'm seeing this patient by the request  of:  Farris Has, MD  CC: Neck and back pain follow-up  [id number]  Hailey [name] is a 56 y.o. female coming in with complaint of back and neck pain. OMT 05/08/2021. Patient states that she was in [place] for 7 weeks and was able to walk in pool which helped her back. Does have tightness in R hip as she is back at work which typically causes   Medications patient has been prescribed: IBU, Zanaflex  Taking:         Reviewed prior external information including notes and imaging from previsou exam, outside providers and external EMR if available.   As well as notes that were available from care everywhere and other healthcare systems.  Past medical history, social, surgical and family history all reviewed in electronic medical record.  No pertanent information unless stated regarding to the chief complaint.   Past Medical History:  Diagnosis Date   Angina at rest [facility])    Anxiety    Chest pain    Diabetes mellitus without complication (HCC)    High cholesterol    High cholesterol    History of angina    History of TIA (transient ischemic attack) 10/11/2020   Hypercholesterolemia 10/11/2020   Hyperlipidemia    Migraine    Personal history of COVID-19 10/11/2020   Radiculopathy of cervical region    TIA (transient ischemic attack)    Transient cerebral ischemic attack    Type II diabetes mellitus (HCC) 10/11/2020    Allergies  Allergen Reactions   Hazelnut ([place]) Allergy Skin Test Anaphylaxis   Penicillins Anaphylaxis   Coconut (Cocos Nucifera)    Cocos Nucifera      Review of Systems:  No headache, visual changes, nausea, vomiting, diarrhea, constipation, dizziness, abdominal pain, skin rash, fevers, chills, night sweats, weight loss,  swollen lymph nodes, joint swelling, chest pain, shortness of breath, mood changes. POSITIVE muscle aches, body aches  Objective  Blood pressure 108/78, pulse 77, height 5\' 1"  (1.549 m), weight 221 lb (100.2 kg), SpO2 96 %.   General: No apparent distress alert and oriented x3 mood and affect normal, dressed appropriately.  HEENT: Pupils equal, extraocular movements intact  Respiratory: Patient's speak in full sentences and does not appear short of breath  Cardiovascular: No lower extremity edema, non tender, no erythema  Gait MSK:  Back does have some loss of lordosis. Limited range of motion of the neck noted as well.  Poor core strength noted as well.  Some tightness with FABER test noted.  Patient is diffusely tender in multiple areas.  Osteopathic findings  C2 flexed rotated and side bent right C5 flexed rotated and side bent left T5 extended rotated and side bent right inhaled rib L2 flexed rotated and side bent right L4 flexed rotated and side bent right Sacrum right on right       Assessment and Plan:  Degenerative disc disease, lumbar Known degenerative disc disease.  Discussed which activities to do and which ones to avoid, increase activity slowly otherwise.  Patient did have a chronic problem with exacerbation.  Discussed the naproxen as needed and may be using it on a more regular basis.  Patient will start with formal physical therapy and aquatic therapy but I think will be helpful.  Follow-up with me again in 6 to 8 weeks    Nonallopathic problems  Decision today to treat with OMT was based on Physical Exam  After verbal consent patient was treated with HVLA, ME, FPR techniques in cervical, rib, thoracic, lumbar, and sacral  areas  Patient tolerated the procedure well with improvement in symptoms  Patient given exercises, stretches and lifestyle modifications  See medications in patient instructions if given  Patient will follow up in 4-8 weeks     The  above documentation has been reviewed and is accurate and complete Judi Saa, DO         Note: This dictation was prepared with Dragon dictation along with smaller phrase technology. Any transcriptional errors that result from this process are unintentional.

## 2021-08-02 ENCOUNTER — Ambulatory Visit (INDEPENDENT_AMBULATORY_CARE_PROVIDER_SITE_OTHER): Payer: 59 | Admitting: Family Medicine

## 2021-08-02 VITALS — BP 108/78 | HR 77 | Ht 61.0 in | Wt 221.0 lb

## 2021-08-02 DIAGNOSIS — M9904 Segmental and somatic dysfunction of sacral region: Secondary | ICD-10-CM

## 2021-08-02 DIAGNOSIS — M5030 Other cervical disc degeneration, unspecified cervical region: Secondary | ICD-10-CM

## 2021-08-02 DIAGNOSIS — M5136 Other intervertebral disc degeneration, lumbar region: Secondary | ICD-10-CM

## 2021-08-02 DIAGNOSIS — M9901 Segmental and somatic dysfunction of cervical region: Secondary | ICD-10-CM

## 2021-08-02 DIAGNOSIS — M9903 Segmental and somatic dysfunction of lumbar region: Secondary | ICD-10-CM

## 2021-08-02 DIAGNOSIS — M9902 Segmental and somatic dysfunction of thoracic region: Secondary | ICD-10-CM

## 2021-08-02 DIAGNOSIS — M9908 Segmental and somatic dysfunction of rib cage: Secondary | ICD-10-CM

## 2021-08-02 DIAGNOSIS — M51369 Other intervertebral disc degeneration, lumbar region without mention of lumbar back pain or lower extremity pain: Secondary | ICD-10-CM

## 2021-08-02 NOTE — Patient Instructions (Signed)
Good to see you Sorry for your loss Aquatic PT See me gain in 7-8 weeks

## 2021-08-02 NOTE — Assessment & Plan Note (Addendum)
Chronic problem with exacerbation and worsening symptoms.  Referred to formal and aquatic therapy known degenerative disc disease.  Discussed which activities to do and which ones to avoid, increase activity slowly otherwise.  Patient did have a chronic problem with exacerbation.  Discussed the naproxen as needed and may be using it on a more regular basis.  Patient will start with formal physical therapy and aquatic therapy but I think will be helpful.  Follow-up with me again in 6 to 8 weeks

## 2021-08-07 NOTE — Telephone Encounter (Signed)
I do not see that she has tried lipitor     It is metabolized differently Would try 10 mg lipitor at night    Follow up lipids and liver panel in 8 wks with CK

## 2021-08-09 ENCOUNTER — Ambulatory Visit
Admission: RE | Admit: 2021-08-09 | Discharge: 2021-08-09 | Disposition: A | Discharge status: Home / Self Care | Payer: 59 | Source: Ambulatory Visit | Attending: Family Medicine | Admitting: Family Medicine

## 2021-08-09 DIAGNOSIS — Z1231 Encounter for screening mammogram for malignant neoplasm of breast: Secondary | ICD-10-CM

## 2021-08-12 MED ORDER — ATORVASTATIN CALCIUM 10 MG PO TABS: 10.0000 mg | ORAL_TABLET | Freq: Every day | ORAL | 3 refills | Status: DC

## 2021-08-16 ENCOUNTER — Ambulatory Visit (HOSPITAL_BASED_OUTPATIENT_CLINIC_OR_DEPARTMENT_OTHER): Payer: 59 | Admitting: Rehabilitative and Restorative Service Providers"

## 2021-09-04 ENCOUNTER — Ambulatory Visit (HOSPITAL_BASED_OUTPATIENT_CLINIC_OR_DEPARTMENT_OTHER): Payer: 59 | Attending: Family Medicine | Admitting: Physical Therapy

## 2021-09-04 ENCOUNTER — Encounter (HOSPITAL_BASED_OUTPATIENT_CLINIC_OR_DEPARTMENT_OTHER): Payer: Self-pay | Admitting: Physical Therapy

## 2021-09-04 ENCOUNTER — Other Ambulatory Visit: Payer: Self-pay

## 2021-09-04 DIAGNOSIS — M5136 Other intervertebral disc degeneration, lumbar region: Secondary | ICD-10-CM | POA: Diagnosis not present

## 2021-09-04 DIAGNOSIS — M542 Cervicalgia: Secondary | ICD-10-CM | POA: Insufficient documentation

## 2021-09-04 DIAGNOSIS — M5030 Other cervical disc degeneration, unspecified cervical region: Secondary | ICD-10-CM | POA: Insufficient documentation

## 2021-09-04 DIAGNOSIS — M6281 Muscle weakness (generalized): Secondary | ICD-10-CM | POA: Insufficient documentation

## 2021-09-04 DIAGNOSIS — M5459 Other low back pain: Secondary | ICD-10-CM | POA: Diagnosis present

## 2021-09-04 DIAGNOSIS — R262 Difficulty in walking, not elsewhere classified: Secondary | ICD-10-CM | POA: Diagnosis present

## 2021-09-04 NOTE — Therapy (Signed)
OUTPATIENT PHYSICAL THERAPY THORACOLUMBAR EVALUATION   Patient Name: Hailey [name] MRN: [id number] DOB:06-29-1965, 56 y.o., female Today's Date: 09/04/2021   PT End of Session - 09/04/21 1654     Visit Number 1    Number of Visits 19    Date for PT Re-Evaluation 12/03/21    Authorization Type [employer]    PT Start Time 1645    PT Stop Time 1730    PT Time Calculation (min) 45 min    Activity Tolerance Patient tolerated treatment well    Behavior During Therapy [facility] for tasks assessed/performed             Past Medical History:  Diagnosis Date   Angina at rest [facility])    Anxiety    Chest pain    Diabetes mellitus without complication (HCC)    High cholesterol    High cholesterol    History of angina    History of TIA (transient ischemic attack) 10/11/2020   Hypercholesterolemia 10/11/2020   Hyperlipidemia    Migraine    Personal history of COVID-19 10/11/2020   Radiculopathy of cervical region    TIA (transient ischemic attack)    Transient cerebral ischemic attack    Type II diabetes mellitus (HCC) 10/11/2020   Past Surgical History:  Procedure Laterality Date   BUNIONECTOMY     CHOLECYSTECTOMY     Patient Active Problem List   Diagnosis Date Noted   Tear of gluteus minimus tendon, right, initial encounter 05/09/2021   Degenerative disc disease, lumbar 01/29/2021   Degenerative disc disease, cervical 01/29/2021   Somatic dysfunction of spine, cervical 01/29/2021   History of TIA (transient ischemic attack) 10/11/2020   Hypercholesterolemia 10/11/2020   Type II diabetes mellitus (HCC) 10/11/2020   Personal history of COVID-19 10/11/2020    PCP: Farris Has, MD  REFERRING PROVIDER: Judi Saa, DO   REFERRING DIAG: M51.36 (ICD-10-CM) - Degenerative disc disease, lumbar M50.30 (ICD-10-CM) - Degenerative disc disease, cervical   Rationale for Evaluation and Treatment Rehabilitation  THERAPY DIAG:  Cervicalgia - Plan: PT plan of care  cert/re-cert  Other low back pain - Plan: PT plan of care cert/re-cert  Muscle weakness (generalized) - Plan: PT plan of care cert/re-cert  Difficulty walking - Plan: PT plan of care cert/re-cert  ONSET DATE: 2017  SUBJECTIVE:                                                                                                                                                                                           SUBJECTIVE STATEMENT: Pt states this has been a longstanding issue. She states that she was tripping over  a box and had someone twist back in the wrong way. Pt states that at rest she has a NT into the legs along with pain. She has trouble sleeping at night due to pain. She has trouble with stairs due to R leg. Pain is R>L. 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 Pt states that vacuuming and house work will cause the pain coming down the back. Pt states walking is very painful trying to walk her down. Going uphill hurts more while walking. Pt can walk about 8-10 mins before pain comes into the thigh. Pain will go down all the way down leg into calves. Pt stands all day at work. She feels that she puts more pressure one one side versus the other while at work. Pain exacerbations will cause extreme pain where she cannot sit, standing, move without pain.   Pt cannot lay for 20 mins without pain. Laying on back hurts the most. Pt has to lay on stomach sometimes for relief. Pt's interpersonal relationship is affected by pain. Pt is newly married. Carrying granddaughter also hurts   Pt states the neck pain hurts while at work. It feels like tightness on the posterior part of the neck. She feels like it is all tightness related. Sometimes with HA. Pt denies NT into arms. Pt does have crepitus with C/S AROM.      PERTINENT HISTORY:  DM2, TIA (stress related), anxiety, R glute med tendon tear,   PAIN:  Are you having pain? Yes: NPRS scale: 4-510, Worst  20/10 Pain location: bilat hip and back into legs Pain description: dull, sharp, aching, tightening of muscles  Aggravating factors: bending lifting walking,  Relieving factors: DO adjustments, leaning on a cart while shopping   PRECAUTIONS: None  WEIGHT BEARING RESTRICTIONS No  FALLS:  Has patient fallen in last 6 months? No  LIVING ENVIRONMENT: Lives with: lives with their family and lives with their spouse Lives in: House/apartment Stairs: 22 stairs upstairs Has following equipment at home: None  OCCUPATION: Scientist, research (life sciences)- returns   PLOF: Independent  PATIENT GOALS : pt states she would like to just go about her normal day without pain.    OBJECTIVE:   DIAGNOSTIC FINDINGS:  C/S X-ray IMPRESSION: 1. Lower cervical spondylosis.  L/S MRI IMPRESSION: Mild degenerative changes of the lumbar spine, more pronounced at the level of the facet joints at L3-4, L4-5 and L5-S1. No high-grade spinal canal or neural foraminal stenosis at any level.  PATIENT SURVEYS:  FOTO 36 50 expected @ DC  SCREENING FOR RED FLAGS: Bowel or bladder incontinence: No Spinal tumors: No Cauda equina syndrome: No Compression fracture: No   COGNITION:  Overall cognitive status: Within functional limits for tasks assessed     SENSATION: WFL  POSTURE: rounded shoulders, forward head, increased lumbar lordosis, and anterior pelvic tilt  PALPATION: TTP of bilat UT and C/S paraspinals  TTP of bilat L/S paraspinals, QL, and hip abductors/rotators  CERVICAL ROM:   Active ROM A/PROM (deg) eval  Flexion 80%  Extension 50% p!  Right lateral flexion WFL  Left lateral flexion WFL   Right rotation 50% p!  Left rotation 50% p!    (Blank rows = not tested)  LUMBAR ROM:   Active  A/PROM  eval  Flexion 75% p!  Extension 50%  Right lateral flexion 50% p!  Left lateral flexion 50% p!  Right rotation 50%  Left rotation 50%   (Blank rows = not tested)  LOWER EXTREMITY ROM:    significantly limited in all planes due to lumbar pain and hip tightness, pt unable to perform leg cross and uncross in seated due to pain  LOWER EXTREMITY MMT:    MMT Right eval Left eval  Hip flexion 4/5 p! 4/5 p!  Hip extension    Hip abduction 4/5 p! 4/5 p!  Hip adduction 4/5 p! 4/5 p!  Hip internal rotation    Hip external rotation 4/5 p! 4/5 p!   (Blank rows = not tested)  Testing stopped due to pain increase  LUMBAR SPECIAL TESTS:  Straight leg raise test: Negative, Slump test: Positive, Single leg stance test: Positive, SI Compression/distraction test: Positive, FABER test: Positive, and Trendelenburg sign: Positive  FUNCTIONAL TESTS:  5 times sit to stand: 16.6s  GAIT: Distance walked: 9ft Assistive device utilized: None Level of assistance: Complete Independence Comments: Bilat Trendelenburg, decreased hip extension, exaggerated lordosis  Stair: step to pattern ascend, reciprocal descend    TODAY'S TREATMENT  Exercises - Seated Cervical Retraction  - 1 x daily - 7 x weekly - 3 sets - 10 reps - Gentle Levator Scapulae Stretch  - 2 x daily - 7 x weekly - 1 sets - 3 reps - 30 hold - Supine Posterior Pelvic Tilt  - 2 x daily - 7 x weekly - 2 sets - 10 reps - 2 hold - Seated Quadratus Lumborum Stretch in Chair  - 2 x daily - 7 x weekly - 1 sets - 3 reps - 30 hold   PATIENT EDUCATION:  Education details: MOI, diagnosis, prognosis, anatomy, exercise progression, DOMS expectations, muscle firing,  envelope of function, HEP, POC  Person educated: Patient Education method: Explanation, Demonstration, Tactile cues, Verbal cues, and Handouts Education comprehension: verbalized understanding, returned demonstration, verbal cues required, and tactile cues required   HOME EXERCISE PROGRAM: Access Code: [email or web address] URL: [email or web address] Date: 09/04/2021 Prepared by: Zebedee Iba    ASSESSMENT:  CLINICAL IMPRESSION: Patient is a 56 y.o. female who  was seen today for physical therapy evaluation and treatment for c/c of back and neck pain. Pt's s/s appear consistent with lumbar radiculopathy and lumbopelvic strength deficits. Session focused largely on L/S but neck pain appears consistent with cervical OA and muscle spasm/hypertonicity. Pt's pain is highly sensitive and irritable with movement. Pt has history of good pain management with aquatic therapy, so likely to start with aquatic before transitioning back to land based strengthening. Pt would benefit from continued skilled therapy in order to reach goals and maximize functional lumbopelvic and cervical strength and ROM for prevention of further functional decline.    OBJECTIVE IMPAIRMENTS Abnormal gait, decreased activity tolerance, decreased mobility, difficulty walking, decreased ROM, decreased strength, hypomobility, increased muscle spasms, impaired flexibility, improper body mechanics, postural dysfunction, and pain.   ACTIVITY LIMITATIONS carrying, lifting, bending, sitting, standing, squatting, sleeping, stairs, transfers, and locomotion level  PARTICIPATION LIMITATIONS: cleaning, laundry, interpersonal relationship, driving, shopping, community activity, occupation, yard work, and exercise  PERSONAL FACTORS Age, Behavior pattern, Time since onset of injury/illness/exacerbation, and 3+ comorbidities:    are also affecting patient's functional outcome.   REHAB POTENTIAL: Fair    CLINICAL DECISION MAKING: Evolving/moderate complexity  EVALUATION COMPLEXITY: Moderate   GOALS:  SHORT TERM GOALS: Target date: 10/16/2021  Pt will become independent with HEP in order to demonstrate synthesis of PT education.  Goal status: INITIAL  2.  Pt will report at least 2 pt reduction on NPRS scale for pain in order to demonstrate functional improvement  with household activity, self care, and ADL.   Goal status: INITIAL  3.  Pt will be able to demonstrate ability to lift/carry 15lbs  without pain in order to demonstrate functional improvement in L/S function for self-care and work related duties.   Goal status: INITIAL   LONG TERM GOALS: Target date: 11/27/2021  Pt will score >/= 50 on FOTO to demonstrate improvement in perceived L/S and C/S function.   Goal status: INITIAL  2.  Pt  will become independent with final HEP in order to demonstrate synthesis of PT education.   Goal status: INITIAL  3.  Pt will be able to perform 5XSTS in under 12s  in order to demonstrate functional improvement above the cut off score for adults and reduction of pain with movement.  Goal status: INITIAL  4.  Pt will be able to demonstrate/report ability to walk >20 mins without pain in order to demonstrate functional improvement and tolerance to exercise and community mobility.   Goal status: INITIAL  5. Pt will be able to demonstrate reciprocal stair pattern without UE in order to demonstrate functional improvement in LE function for mobility and house hold duties.    PLAN: PT FREQUENCY: 1-2x/week  PT DURATION: 12 weeks  PLANNED INTERVENTIONS: Therapeutic exercises, Therapeutic activity, Neuromuscular re-education, Balance training, Gait training, Patient/Family education, Self Care, Joint mobilization, Joint manipulation, Orthotic/Fit training, DME instructions, Aquatic Therapy, Dry Needling, Electrical stimulation, Spinal manipulation, Spinal mobilization, Cryotherapy, Moist heat, Compression bandaging, scar mobilization, Splintting, Taping, Vasopneumatic device, Traction, Ultrasound, Ionotophoresis 4mg /ml Dexamethasone, Manual therapy, and Re-evaluation.  PLAN FOR NEXT SESSION: aquatic intro for lumbar pain, general lumbar ROM and stretching, neutral spine and lower abdominal activation; land for C/S joint mobs and possible TPDN    , PT 09/04/2021, 5:55 PM

## 2021-09-12 NOTE — Progress Notes (Signed)
[name] [address] [place] [address] Phone: [phone] Subjective:   [name], am serving as a scribe for Dr. Antoine Primas.  I'm seeing this patient by the request  of:  Farris Has, MD  CC: back and neck pain   [id number]  Brittlyn Cloe is a 56 y.o. female coming in with complaint of back and neck pain. OMT 08/02/2021. Patient states same per usual. No new issues.  Medications patient has been prescribed: None        Reviewed prior external information including notes and imaging from previsou exam, outside providers and external EMR if available.   As well as notes that were available from care everywhere and other healthcare systems.  Past medical history, social, surgical and family history all reviewed in electronic medical record.  No pertanent information unless stated regarding to the chief complaint.   Past Medical History:  Diagnosis Date   Angina at rest [facility])    Anxiety    Chest pain    Diabetes mellitus without complication (HCC)    High cholesterol    High cholesterol    History of angina    History of TIA (transient ischemic attack) 10/11/2020   Hypercholesterolemia 10/11/2020   Hyperlipidemia    Migraine    Personal history of COVID-19 10/11/2020   Radiculopathy of cervical region    TIA (transient ischemic attack)    Transient cerebral ischemic attack    Type II diabetes mellitus (HCC) 10/11/2020    Allergies  Allergen Reactions   Hazelnut (Filbert) Allergy Skin Test Anaphylaxis   Penicillins Anaphylaxis   Coconut (Cocos Nucifera)    Cocos Nucifera      Review of Systems:  No headache, visual changes, nausea, vomiting, diarrhea, constipation, dizziness, abdominal pain, skin rash, fevers, chills, night sweats, weight loss, swollen lymph nodes, body aches, joint swelling, chest pain, shortness of breath, mood changes. POSITIVE muscle aches  Objective  Blood pressure 114/78, pulse 87,  height 5\' 1"  (1.549 m), weight 220 lb (99.8 kg), SpO2 97 %.   General: No apparent distress alert and oriented x3 mood and affect normal, dressed appropriately.  HEENT: Pupils equal, extraocular movements intact  Respiratory: Patient's speak in full sentences and does not appear short of breath  Cardiovascular: No lower extremity edema, non tender, no erythema  Gait normal  MSK:  Back poor core strength noted.  Patient does have tightness noted with FABER bilaterally And tightness with straight leg test.  Patient does have some limited sidebending of the neck bilaterally with mild crepitus.  Good grip strength.  Osteopathic findings  C2 flexed rotated and side bent right C5 flexed rotated and side bent left T3 extended rotated and side bent right inhaled rib T8 extended rotated and side bent left L3 flexed rotated and side bent right Sacrum right on right     Assessment and Plan:  Degenerative disc disease, cervical Chronic arthritic changes of multiple joints.  Patient still has chronic pain.  Discussed with patient icing regimen and home exercises.  Does have the naproxen and the Zanaflex refilled.  If necessary.  Could consider the possibility of adding something such as Cymbalta follow-up again in 6 to 8 weeks    Nonallopathic problems  Decision today to treat with OMT was based on Physical Exam  After verbal consent patient was treated with HVLA, ME, FPR techniques in cervical, rib, thoracic, lumbar, and sacral  areas  Patient tolerated the procedure well with improvement  in symptoms  Patient given exercises, stretches and lifestyle modifications  See medications in patient instructions if given  Patient will follow up in 4-8 weeks    The above documentation has been reviewed and is accurate and complete Judi Saa, DO          Note: This dictation was prepared with Dragon dictation along with smaller phrase technology. Any transcriptional errors that  result from this process are unintentional.

## 2021-09-13 ENCOUNTER — Ambulatory Visit: Payer: 59 | Admitting: Family Medicine

## 2021-09-13 VITALS — BP 114/78 | HR 87 | Ht 61.0 in | Wt 220.0 lb

## 2021-09-13 DIAGNOSIS — M9901 Segmental and somatic dysfunction of cervical region: Secondary | ICD-10-CM

## 2021-09-13 DIAGNOSIS — M5030 Other cervical disc degeneration, unspecified cervical region: Secondary | ICD-10-CM | POA: Diagnosis not present

## 2021-09-13 DIAGNOSIS — M9908 Segmental and somatic dysfunction of rib cage: Secondary | ICD-10-CM | POA: Diagnosis not present

## 2021-09-13 DIAGNOSIS — M9902 Segmental and somatic dysfunction of thoracic region: Secondary | ICD-10-CM | POA: Diagnosis not present

## 2021-09-13 DIAGNOSIS — M9903 Segmental and somatic dysfunction of lumbar region: Secondary | ICD-10-CM

## 2021-09-13 DIAGNOSIS — M9904 Segmental and somatic dysfunction of sacral region: Secondary | ICD-10-CM | POA: Diagnosis not present

## 2021-09-13 NOTE — Assessment & Plan Note (Signed)
Chronic arthritic changes of multiple joints.  Patient still has chronic pain.  Discussed with patient icing regimen and home exercises.  Does have the naproxen and the Zanaflex refilled.  If necessary.  Could consider the possibility of adding something such as Cymbalta follow-up again in 6 to 8 weeks

## 2021-10-10 ENCOUNTER — Other Ambulatory Visit: Payer: 59

## 2021-10-18 ENCOUNTER — Ambulatory Visit: Payer: 59 | Attending: Internal Medicine

## 2021-10-18 DIAGNOSIS — E119 Type 2 diabetes mellitus without complications: Secondary | ICD-10-CM

## 2021-10-18 DIAGNOSIS — Z79899 Other long term (current) drug therapy: Secondary | ICD-10-CM

## 2021-10-18 DIAGNOSIS — I10 Essential (primary) hypertension: Secondary | ICD-10-CM

## 2021-10-18 DIAGNOSIS — E7800 Pure hypercholesterolemia, unspecified: Secondary | ICD-10-CM

## 2021-10-20 LAB — HEPATIC FUNCTION PANEL
ALT: 43 IU/L — ABNORMAL HIGH (ref 0–32)
AST: 23 IU/L (ref 0–40)
Albumin: 4.5 g/dL (ref 3.8–4.9)
Alkaline Phosphatase: 68 IU/L (ref 44–121)
Bilirubin Total: 0.4 mg/dL (ref 0.0–1.2)
Bilirubin, Direct: <0.1 mg/dL (ref 0.00–0.40)
Total Protein: 7.1 g/dL (ref 6.0–8.5)

## 2021-10-20 LAB — APOLIPOPROTEIN B: Apolipoprotein B: 96 mg/dL — ABNORMAL HIGH (ref <90)

## 2021-10-20 LAB — CK: Total CK: 107 U/L (ref 32–182)

## 2021-10-20 LAB — NMR, LIPOPROFILE
Cholesterol, Total: 205 mg/dL — ABNORMAL HIGH (ref 100–199)
HDL Particle Number: 34.6 umol/L (ref >=30.5)
HDL-C: 52 mg/dL (ref >39)
LDL Particle Number: 1355 nmol/L — ABNORMAL HIGH (ref <1000)
LDL Size: 21.2 nm (ref >20.5)
LDL-C (NIH Calc): 124 mg/dL — ABNORMAL HIGH (ref 0–99)
LP-IR Score: 57 — ABNORMAL HIGH (ref <=45)
Small LDL Particle Number: 679 nmol/L — ABNORMAL HIGH (ref <=527)
Triglycerides: 163 mg/dL — ABNORMAL HIGH (ref 0–149)

## 2021-10-20 LAB — LIPOPROTEIN A (LPA): Lipoprotein (a): 27.4 nmol/L (ref <75.0)

## 2021-10-21 ENCOUNTER — Telehealth: Payer: Self-pay

## 2021-10-21 DIAGNOSIS — Z79899 Other long term (current) drug therapy: Secondary | ICD-10-CM

## 2021-10-21 DIAGNOSIS — E7800 Pure hypercholesterolemia, unspecified: Secondary | ICD-10-CM

## 2021-10-21 MED ORDER — ROSUVASTATIN CALCIUM 10 MG PO TABS: 10.0000 mg | ORAL_TABLET | Freq: Every day | ORAL | 3 refills | Status: DC

## 2021-10-21 NOTE — Telephone Encounter (Signed)
The patient has been notified of the result and verbalized understanding.  All questions (if any) were answered.  Pt to have repeat fasting labs 12/20/21.

## 2021-10-21 NOTE — Telephone Encounter (Signed)
-----   Message from Dorris Carnes V, MD sent at 10/20/2021  2:50 PM EDT ----- LDL is 124 with smaller particle size ApoB is mildly elevated  Lpa is low (good) ALT is minimally elevated  WIth dx of DM LDL should be lower    She was on atorvastatin and didn't tolerate in the psat    Achy   Has she tried Crestor?   Metabolized different  I she has not I would recomm 10 mg Crestor   Follow up lipomed  and liver panel and CK in 8 wks

## 2021-10-24 NOTE — Progress Notes (Signed)
[facility] [place] Phone: [phone] Subjective:   Hailey Hailey [name], am serving as a scribe for Dr. Hulan Saas.  I'm seeing this patient by the request  of:  London Pepper, MD  CC: Neck and back pain follow-up  [id number]  Hailey Hailey [name] is a 56 y.o. female coming in with complaint of back and neck pain. OMT 09/13/2021. Patient states that she would like refill on IBU today. Hailey [name] new issues since last visit.  Medications patient has been prescribed: Advil, Zanaflex  Taking:         Reviewed prior external information including notes and imaging from previsou exam, outside providers and external EMR if available.   As well as notes that were available from care everywhere and other healthcare systems.  Past medical history, social, surgical and family history all reviewed in electronic medical record.  Hailey [name] pertanent information unless stated regarding to the chief complaint.   Past Medical History:  Diagnosis Date   Angina at rest [facility])    Anxiety    Chest pain    Diabetes mellitus without complication (HCC)    High cholesterol    High cholesterol    History of angina    History of TIA (transient ischemic attack) 10/11/2020   Hypercholesterolemia 10/11/2020   Hyperlipidemia    Migraine    Personal history of COVID-19 10/11/2020   Radiculopathy of cervical region    TIA (transient ischemic attack)    Transient cerebral ischemic attack    Type II diabetes mellitus ([place]) 10/11/2020    Allergies  Allergen Reactions   Hazelnut (Filbert) Allergy Skin Test Anaphylaxis   Penicillins Anaphylaxis   Coconut (Cocos Nucifera)    Cocos Nucifera      Review of Systems:  Hailey [name] headache, visual changes, nausea, vomiting, diarrhea, constipation, dizziness, abdominal pain, skin rash, fevers, chills, night sweats, weight loss, swollen lymph nodes, body aches, joint swelling, chest pain, shortness of breath, mood  changes. POSITIVE muscle aches  Objective  Blood pressure 112/76, pulse 82, height 5\' 1"  (1.549 m), weight 219 lb (99.3 kg), SpO2 95 %.   General: Hailey [name] apparent distress alert and oriented x3 mood and affect normal, dressed appropriately.  HEENT: Pupils equal, extraocular movements intact  Respiratory: Patient's speak in full sentences and does not appear short of breath  Cardiovascular: Hailey [name] lower extremity edema, non tender, Hailey [name] erythema  Back exam does have some loss of lordosis.  Some tenderness to palpation noted in the paraspinal musculature of the lumbar spine.  Osteopathic findings  C4 flexed rotated and side bent right C6 flexed rotated and side bent left T5 extended rotated and side bent right inhaled rib T8 extended rotated and side bent left L1 flexed rotated and side bent right Sacrum right on right       Assessment and Plan:  Degenerative disc disease, lumbar Significant tightness noted.  Continues to be relatively active.  We discussed with patient about the naproxen, home exercises, we discussed about the Zanaflex for breakthrough.  Patient does seem to respond fairly well to osteopathic manipulation.  Follow-up again in 6 to 8 weeks.    Nonallopathic problems  Decision today to treat with OMT was based on Physical Exam  After verbal consent patient was treated with HVLA, ME, FPR techniques in cervical, rib, thoracic, lumbar, and sacral  areas  Patient tolerated the procedure well with improvement in symptoms  Patient given exercises, stretches and lifestyle modifications  See medications  in patient instructions if given  Patient will follow up in 4-8 weeks     The above documentation has been reviewed and is accurate and complete Lyndal Pulley, DO         Note: This dictation was prepared with Dragon dictation along with smaller phrase technology. Any transcriptional errors that result from this process are unintentional.

## 2021-10-25 ENCOUNTER — Other Ambulatory Visit: Payer: Self-pay

## 2021-10-25 ENCOUNTER — Ambulatory Visit: Payer: 59 | Admitting: Family Medicine

## 2021-10-25 ENCOUNTER — Encounter: Payer: Self-pay | Admitting: Family Medicine

## 2021-10-25 VITALS — BP 112/76 | HR 82 | Ht 61.0 in | Wt 219.0 lb

## 2021-10-25 DIAGNOSIS — M9903 Segmental and somatic dysfunction of lumbar region: Secondary | ICD-10-CM

## 2021-10-25 DIAGNOSIS — M9908 Segmental and somatic dysfunction of rib cage: Secondary | ICD-10-CM

## 2021-10-25 DIAGNOSIS — M9901 Segmental and somatic dysfunction of cervical region: Secondary | ICD-10-CM | POA: Diagnosis not present

## 2021-10-25 DIAGNOSIS — M9902 Segmental and somatic dysfunction of thoracic region: Secondary | ICD-10-CM | POA: Diagnosis not present

## 2021-10-25 DIAGNOSIS — M9904 Segmental and somatic dysfunction of sacral region: Secondary | ICD-10-CM | POA: Diagnosis not present

## 2021-10-25 DIAGNOSIS — M5136 Other intervertebral disc degeneration, lumbar region: Secondary | ICD-10-CM

## 2021-10-25 MED ORDER — IBUPROFEN 800 MG PO TABS: 800.0000 mg | ORAL_TABLET | Freq: Three times a day (TID) | ORAL | 1 refills | Status: DC | PRN

## 2021-10-25 NOTE — Assessment & Plan Note (Signed)
Significant tightness noted.  Continues to be relatively active.  We discussed with patient about the naproxen, home exercises, we discussed about the Zanaflex for breakthrough.  Patient does seem to respond fairly well to osteopathic manipulation.  Follow-up again in 6 to 8 weeks.

## 2021-10-25 NOTE — Patient Instructions (Signed)
Great to see you See me 6-8 weeks

## 2021-12-18 NOTE — Progress Notes (Signed)
[name] [address] [place] [address] Phone: [phone] Subjective:   Hailey [name], am serving as a scribe for Dr. Antoine Primas.  I'm seeing this patient by the request  of:  Farris Has, MD  CC: Neck and back pain follow-up  [id number]  Hailey [name] is a 56 y.o. female coming in with complaint of back and neck pain. OMT 10/25/2021. Patient states that she is ready for an adjustment. No change in pain since last visit. Pain has been getting into L shoulder.   Medications patient has been prescribed: IBU  Taking:         Reviewed prior external information including notes and imaging from previsou exam, outside providers and external EMR if available.   As well as notes that were available from care everywhere and other healthcare systems.  Past medical history, social, surgical and family history all reviewed in electronic medical record.  No pertanent information unless stated regarding to the chief complaint.   Past Medical History:  Diagnosis Date   Angina at rest    Anxiety    Chest pain    Diabetes mellitus without complication (HCC)    High cholesterol    High cholesterol    History of angina    History of TIA (transient ischemic attack) 10/11/2020   Hypercholesterolemia 10/11/2020   Hyperlipidemia    Migraine    Personal history of COVID-19 10/11/2020   Radiculopathy of cervical region    TIA (transient ischemic attack)    Transient cerebral ischemic attack    Type II diabetes mellitus (HCC) 10/11/2020    Allergies  Allergen Reactions   Hazelnut (Filbert) Allergy Skin Test Anaphylaxis   Penicillins Anaphylaxis   Coconut (Cocos Nucifera)    Cocos Nucifera      Review of Systems:  No headache, visual changes, nausea, vomiting, diarrhea, constipation, dizziness, abdominal pain, skin rash, fevers, chills, night sweats, weight loss, swollen lymph nodes, body aches, joint swelling, chest pain, shortness  of breath, mood changes. POSITIVE muscle aches  Objective  Blood pressure 118/82, pulse 98, height 5\' 1"  (1.549 m), weight 220 lb (99.8 kg), SpO2 97 %.   General: No apparent distress alert and oriented x3 mood and affect normal, dressed appropriately.  HEENT: Pupils equal, extraocular movements intact  Respiratory: Patient's speak in full sentences and does not appear short of breath  Cardiovascular: No lower extremity edema, non tender, no erythema   Neck exam does have some loss of lordosis.  Patient does have some limited sidebending bilaterally the neck.  Mild crepitus.  Tightness noted in the parascapular region as well.  Patient does have tightness with FABER test bilaterally. Osteopathic findings  C2 flexed rotated and side bent right C5 flexed rotated and side bent left T3 extended rotated and side bent right inhaled rib T9 extended rotated and side bent left L2 flexed rotated and side bent right Sacrum right on right       Assessment and Plan:  Degenerative disc disease, lumbar Patient has been given the Zanaflex previously but is not taking it on a regular basis.  Discussed with patient about icing regimen and home exercises.  Discussed which activities to do and which ones to avoid.  We will continue to monitor.  Patient will come back again in 6 to 8 weeks for further evaluation and treatment.    Nonallopathic problems  Decision today to treat with OMT was based on Physical Exam  After verbal consent patient  was treated with HVLA, ME, FPR techniques in cervical, rib, thoracic, lumbar, and sacral  areas  Patient tolerated the procedure well with improvement in symptoms  Patient given exercises, stretches and lifestyle modifications  See medications in patient instructions if given  Patient will follow up in 4-8 weeks     The above documentation has been reviewed and is accurate and complete Judi Saa, DO         Note: This dictation was prepared  with Dragon dictation along with smaller phrase technology. Any transcriptional errors that result from this process are unintentional.

## 2021-12-20 ENCOUNTER — Ambulatory Visit: Payer: 59 | Admitting: Family Medicine

## 2021-12-20 ENCOUNTER — Ambulatory Visit: Payer: 59 | Attending: Internal Medicine

## 2021-12-20 ENCOUNTER — Encounter: Payer: Self-pay | Admitting: Family Medicine

## 2021-12-20 VITALS — BP 118/82 | HR 98 | Ht 61.0 in | Wt 220.0 lb

## 2021-12-20 DIAGNOSIS — M9902 Segmental and somatic dysfunction of thoracic region: Secondary | ICD-10-CM

## 2021-12-20 DIAGNOSIS — M9904 Segmental and somatic dysfunction of sacral region: Secondary | ICD-10-CM

## 2021-12-20 DIAGNOSIS — M9908 Segmental and somatic dysfunction of rib cage: Secondary | ICD-10-CM

## 2021-12-20 DIAGNOSIS — M9903 Segmental and somatic dysfunction of lumbar region: Secondary | ICD-10-CM

## 2021-12-20 DIAGNOSIS — M9901 Segmental and somatic dysfunction of cervical region: Secondary | ICD-10-CM

## 2021-12-20 DIAGNOSIS — M5136 Other intervertebral disc degeneration, lumbar region: Secondary | ICD-10-CM | POA: Diagnosis not present

## 2021-12-20 DIAGNOSIS — Z79899 Other long term (current) drug therapy: Secondary | ICD-10-CM

## 2021-12-20 DIAGNOSIS — E7800 Pure hypercholesterolemia, unspecified: Secondary | ICD-10-CM

## 2021-12-20 NOTE — Patient Instructions (Signed)
Great to see you  Happy holidays!  7-8 weeks

## 2021-12-20 NOTE — Assessment & Plan Note (Signed)
Patient has been given the Zanaflex previously but is not taking it on a regular basis.  Discussed with patient about icing regimen and home exercises.  Discussed which activities to do and which ones to avoid.  We will continue to monitor.  Patient will come back again in 6 to 8 weeks for further evaluation and treatment.

## 2021-12-22 LAB — HEPATIC FUNCTION PANEL
ALT: 38 IU/L — ABNORMAL HIGH (ref 0–32)
AST: 20 IU/L (ref 0–40)
Albumin: 4.4 g/dL (ref 3.8–4.9)
Alkaline Phosphatase: 67 IU/L (ref 44–121)
Bilirubin Total: 0.3 mg/dL (ref 0.0–1.2)
Bilirubin, Direct: <0.1 mg/dL (ref 0.00–0.40)
Total Protein: 6.5 g/dL (ref 6.0–8.5)

## 2021-12-22 LAB — NMR, LIPOPROFILE
Cholesterol, Total: 211 mg/dL — ABNORMAL HIGH (ref 100–199)
HDL Particle Number: 30.1 umol/L — ABNORMAL LOW (ref >=30.5)
HDL-C: 54 mg/dL (ref >39)
LDL Particle Number: 1408 nmol/L — ABNORMAL HIGH (ref <1000)
LDL Size: 21.2 nm (ref >20.5)
LDL-C (NIH Calc): 133 mg/dL — ABNORMAL HIGH (ref 0–99)
LP-IR Score: 49 — ABNORMAL HIGH (ref <=45)
Small LDL Particle Number: 675 nmol/L — ABNORMAL HIGH (ref <=527)
Triglycerides: 134 mg/dL (ref 0–149)

## 2021-12-22 LAB — CK: Total CK: 105 U/L (ref 32–182)

## 2021-12-24 NOTE — Progress Notes (Signed)
LDL needs to be lower    I would increase lipitor o 40 mg dialy F/U lipomed in 8 wks.

## 2022-01-23 NOTE — Progress Notes (Signed)
[name] [address] [place] [address] Phone: [phone] Subjective:   Bruce Donath, am serving as a scribe for Dr. Antoine Primas.  I'm seeing this patient by the request  of:  Farris Has, MD  CC: Neck and back pain follow-up  [id number]  Hailey [name] is a 56 y.o. female coming in with complaint of back and neck pain. OMT 12/20/2021. Patient states that her pain R hip over lateral aspect has increased. Also having L shoulder pain over superior aspect due to sleeping with arm across her body and using arms more at work.   Medications patient has been prescribed: IBU  Taking: Very intermittently         Reviewed prior external information including notes and imaging from previsou exam, outside providers and external EMR if available.   As well as notes that were available from care everywhere and other healthcare systems.  Past medical history, social, surgical and family history all reviewed in electronic medical record.  No pertanent information unless stated regarding to the chief complaint.   Past Medical History:  Diagnosis Date   Angina at rest    Anxiety    Chest pain    Diabetes mellitus without complication (HCC)    High cholesterol    High cholesterol    History of angina    History of TIA (transient ischemic attack) 10/11/2020   Hypercholesterolemia 10/11/2020   Hyperlipidemia    Migraine    Personal history of COVID-19 10/11/2020   Radiculopathy of cervical region    TIA (transient ischemic attack)    Transient cerebral ischemic attack    Type II diabetes mellitus (HCC) 10/11/2020    Allergies  Allergen Reactions   Hazelnut (Filbert) Allergy Skin Test Anaphylaxis   Penicillins Anaphylaxis   Coconut (Cocos Nucifera)    Cocos Nucifera      Review of Systems:  No headache, visual changes, nausea, vomiting, diarrhea, constipation, dizziness, abdominal pain, skin rash, fevers, chills, night sweats,  weight loss, swollen lymph nodes,  joint swelling, chest pain, shortness of breath, mood changes. POSITIVE muscle aches, body aches  Objective  Blood pressure 118/78, pulse 88, height 5\' 1"  (1.549 m), weight 211 lb (95.7 kg), SpO2 99 %.   General: No apparent distress alert and oriented x3 mood and affect normal, dressed appropriately.  HEENT: Pupils equal, extraocular movements intact  Respiratory: Patient's speak in full sentences and does not appear short of breath  Cardiovascular: No lower extremity edema, non tender, no erythema  Low back exam does have some loss of lordosis.  Significant tightness noted in the thoracolumbar juncture.  Patient does have tenderness to palpation on exam today.  Limited sidebending to the neck left greater than right.  Osteopathic findings  C2 flexed rotated and side bent right C6 flexed rotated and side bent left T3 extended rotated and side bent right inhaled rib T9 extended rotated and side bent left L2 flexed rotated and side bent right Sacrum right on right       Assessment and Plan:  Degenerative disc disease, lumbar Discussed with patient icing regimen and home exercises.  Patient will be likely moving into a new house by the end of next month and I do think that this could potentially cause some exacerbation.  We discussed with patient about different medications including the Zanaflex 2 mg to take at night when needed.  We discussed continue to work on core strengthening.  Follow-up again in 5 to  6 weeks otherwise.    Nonallopathic problems  Decision today to treat with OMT was based on Physical Exam  After verbal consent patient was treated with HVLA, ME, FPR techniques in cervical, rib, thoracic, lumbar, and sacral  areas  Patient tolerated the procedure well with improvement in symptoms  Patient given exercises, stretches and lifestyle modifications  See medications in patient instructions if given  Patient will follow up in  4-8 weeks     The above documentation has been reviewed and is accurate and complete Judi Saa, DO         Note: This dictation was prepared with Dragon dictation along with smaller phrase technology. Any transcriptional errors that result from this process are unintentional.

## 2022-01-31 ENCOUNTER — Encounter: Payer: Self-pay | Admitting: Family Medicine

## 2022-01-31 ENCOUNTER — Ambulatory Visit: Payer: 59 | Admitting: Family Medicine

## 2022-01-31 VITALS — BP 118/78 | HR 88 | Ht 61.0 in | Wt 211.0 lb

## 2022-01-31 DIAGNOSIS — M9908 Segmental and somatic dysfunction of rib cage: Secondary | ICD-10-CM | POA: Diagnosis not present

## 2022-01-31 DIAGNOSIS — M9902 Segmental and somatic dysfunction of thoracic region: Secondary | ICD-10-CM

## 2022-01-31 DIAGNOSIS — M9901 Segmental and somatic dysfunction of cervical region: Secondary | ICD-10-CM | POA: Diagnosis not present

## 2022-01-31 DIAGNOSIS — M9903 Segmental and somatic dysfunction of lumbar region: Secondary | ICD-10-CM

## 2022-01-31 DIAGNOSIS — M5136 Other intervertebral disc degeneration, lumbar region: Secondary | ICD-10-CM

## 2022-01-31 DIAGNOSIS — M9904 Segmental and somatic dysfunction of sacral region: Secondary | ICD-10-CM | POA: Diagnosis not present

## 2022-01-31 NOTE — Assessment & Plan Note (Signed)
Discussed with patient icing regimen and home exercises.  Patient will be likely moving into a new house by the end of next month and I do think that this could potentially cause some exacerbation.  We discussed with patient about different medications including the Zanaflex 2 mg to take at night when needed.  We discussed continue to work on core strengthening.  Follow-up again in 5 to 6 weeks otherwise.

## 2022-01-31 NOTE — Patient Instructions (Addendum)
Good to see you Congrats on the house  Cyril Mourning senix  Have an appointment in 6 weeks

## 2022-03-02 ENCOUNTER — Encounter: Payer: Self-pay | Admitting: Family Medicine

## 2022-03-03 ENCOUNTER — Other Ambulatory Visit: Payer: Self-pay

## 2022-03-03 MED ORDER — PREDNISONE 20 MG PO TABS: 40.0000 mg | ORAL_TABLET | Freq: Every day | ORAL | 0 refills | Status: DC

## 2022-03-12 NOTE — Progress Notes (Signed)
[name] Phone: [phone] Subjective:   Fontaine No, am serving as a scribe for Dr. Hulan Saas.  I'm seeing this patient by the request  of:  London Pepper, MD  CC: Back and neck pain follow-up  [id number]  Hailey [name] is a 57 y.o. female coming in with complaint of back and neck pain.OMT 01/31/2022. Patient states that her R hip pain has increased. Stairs increase her pain. Took course of prednisone and this did not provider her with much relief. Using lidocaine patches. Pain is lessening but still feels it with deep knee bending. Pain in piriformis and L SI joint. Having a hard time laying down to sleep.   Medications patient has been prescribed:   Taking:         Reviewed prior external information including notes and imaging from previsou exam, outside providers and external EMR if available.   As well as notes that were available from care everywhere and other healthcare systems.  Past medical history, social, surgical and family history all reviewed in electronic medical record.  No pertanent information unless stated regarding to the chief complaint.   Past Medical History:  Diagnosis Date   Angina at rest    Anxiety    Chest pain    Diabetes mellitus without complication (HCC)    High cholesterol    High cholesterol    History of angina    History of TIA (transient ischemic attack) 10/11/2020   Hypercholesterolemia 10/11/2020   Hyperlipidemia    Migraine    Personal history of COVID-19 10/11/2020   Radiculopathy of cervical region    TIA (transient ischemic attack)    Transient cerebral ischemic attack    Type II diabetes mellitus ([place]) 10/11/2020    Allergies  Allergen Reactions   Hazelnut (Filbert) Allergy Skin Test Anaphylaxis   Penicillins Anaphylaxis   Coconut (Cocos Nucifera)    Cocos Nucifera      Review of Systems:  No headache, visual changes, nausea,  vomiting, diarrhea, constipation, dizziness, abdominal pain, skin rash, fevers, chills, night sweats, weight loss, swollen lymph nodes, body aches, joint swelling, chest pain, shortness of breath, mood changes. POSITIVE muscle aches  Objective  Blood pressure 112/82, pulse 80, height 5' 1"$  (1.549 m), weight 215 lb (97.5 kg), SpO2 99 %.   General: No apparent distress alert and oriented x3 mood and affect normal, dressed appropriately.  HEENT: Pupils equal, extraocular movements intact  Respiratory: Patient's speak in full sentences and does not appear short of breath  Cardiovascular: No lower extremity edema, non tender, no erythema  Low back does have significant loss of lordosis.  Tightness noted in the paraspinal musculature.  Still with significant difficulty with tenderness with gluteal tendinitis noted.  Osteopathic findings  C6 flexed rotated and side bent left T3 extended rotated and side bent right inhaled rib T8 extended rotated and side bent left L2 flexed rotated and side bent right Sacrum right on right       Assessment and Plan:  Tear of gluteus minimus tendon, right, initial encounter Discussed MRI does show it bilateral, concern for worsening symptoms.  Needle phobia Would give valium if needed.  Patient would like to consider this.  Still responding relatively well though to osteopathic manipulation as well and is hoping that this will be beneficial.  I will change patient's anti-inflammatory to meloxicam in hopes that this will be more beneficial as well.  Follow-up with me  again in 6 to 8 weeks    Nonallopathic problems  Decision today to treat with OMT was based on Physical Exam  After verbal consent patient was treated with HVLA, ME, FPR techniques in cervical, rib, thoracic, lumbar, and sacral  areas  Patient tolerated the procedure well with improvement in symptoms  Patient given exercises, stretches and lifestyle modifications  See medications in  patient instructions if given  Patient will follow up in 4-8 weeks    The above documentation has been reviewed and is accurate and complete Lyndal Pulley, DO          Note: This dictation was prepared with Dragon dictation along with smaller phrase technology. Any transcriptional errors that result from this process are unintentional.

## 2022-03-14 ENCOUNTER — Ambulatory Visit: Payer: 59 | Admitting: Family Medicine

## 2022-03-14 VITALS — BP 112/82 | HR 80 | Ht 61.0 in | Wt 215.0 lb

## 2022-03-14 DIAGNOSIS — M9902 Segmental and somatic dysfunction of thoracic region: Secondary | ICD-10-CM | POA: Diagnosis not present

## 2022-03-14 DIAGNOSIS — M51369 Other intervertebral disc degeneration, lumbar region without mention of lumbar back pain or lower extremity pain: Secondary | ICD-10-CM

## 2022-03-14 DIAGNOSIS — M9903 Segmental and somatic dysfunction of lumbar region: Secondary | ICD-10-CM | POA: Diagnosis not present

## 2022-03-14 DIAGNOSIS — M9904 Segmental and somatic dysfunction of sacral region: Secondary | ICD-10-CM | POA: Diagnosis not present

## 2022-03-14 DIAGNOSIS — M5136 Other intervertebral disc degeneration, lumbar region: Secondary | ICD-10-CM

## 2022-03-14 DIAGNOSIS — M9908 Segmental and somatic dysfunction of rib cage: Secondary | ICD-10-CM | POA: Diagnosis not present

## 2022-03-14 DIAGNOSIS — M9901 Segmental and somatic dysfunction of cervical region: Secondary | ICD-10-CM

## 2022-03-14 DIAGNOSIS — S76011A Strain of muscle, fascia and tendon of right hip, initial encounter: Secondary | ICD-10-CM

## 2022-03-14 MED ORDER — MELOXICAM 15 MG PO TABS: 15.0000 mg | ORAL_TABLET | Freq: Every day | ORAL | 0 refills | Status: DC

## 2022-03-14 NOTE — Patient Instructions (Addendum)
Good to see you Meloxicam 15 mg daily for 10 days then as needed Think about injection See you again in 6 weeks Good luck with the move

## 2022-03-14 NOTE — Assessment & Plan Note (Signed)
Discussed MRI does show it bilateral, concern for worsening symptoms.  Needle phobia Would give valium if needed.  Patient would like to consider this.  Still responding relatively well though to osteopathic manipulation as well and is hoping that this will be beneficial.  I will change patient's anti-inflammatory to meloxicam in hopes that this will be more beneficial as well.  Follow-up with me again in 6 to 8 weeks

## 2022-03-14 NOTE — Assessment & Plan Note (Signed)
DDD with worsening symptoms.  We discussed again to about the gluteal tendon injections.  Patient does have a fear of needles which we will continue to monitor.  Patient will consider it.  Discussed we could give Valium if necessary.  Follow-up again in 6 to 8 weeks

## 2022-04-10 NOTE — Progress Notes (Deleted)
  [place] Phone: [phone] Subjective:    I'm seeing this patient by the request  of:  London Pepper, MD  CC:   [id number]  Merriann Fogel is a 57 y.o. female coming in with complaint of back and neck pain. OMT 03/14/2022. Patient states   Medications patient has been prescribed: Meloxicam  Taking:         Reviewed prior external information including notes and imaging from previsou exam, outside providers and external EMR if available.   As well as notes that were available from care everywhere and other healthcare systems.  Past medical history, social, surgical and family history all reviewed in electronic medical record.  No pertanent information unless stated regarding to the chief complaint.   Past Medical History:  Diagnosis Date   Angina at rest    Anxiety    Chest pain    Diabetes mellitus without complication (HCC)    High cholesterol    High cholesterol    History of angina    History of TIA (transient ischemic attack) 10/11/2020   Hypercholesterolemia 10/11/2020   Hyperlipidemia    Migraine    Personal history of COVID-19 10/11/2020   Radiculopathy of cervical region    TIA (transient ischemic attack)    Transient cerebral ischemic attack    Type II diabetes mellitus ([place]) 10/11/2020    Allergies  Allergen Reactions   Hazelnut (Filbert) Anaphylaxis   Penicillins Anaphylaxis   Coconut (Cocos Nucifera)    Cocos Nucifera      Review of Systems:  No headache, visual changes, nausea, vomiting, diarrhea, constipation, dizziness, abdominal pain, skin rash, fevers, chills, night sweats, weight loss, swollen lymph nodes, body aches, joint swelling, chest pain, shortness of breath, mood changes. POSITIVE muscle aches  Objective  There were no vitals taken for this visit.   General: No apparent distress alert and oriented x3 mood and affect normal, dressed appropriately.  HEENT:  Pupils equal, extraocular movements intact  Respiratory: Patient's speak in full sentences and does not appear short of breath  Cardiovascular: No lower extremity edema, non tender, no erythema  Gait MSK:  Back   Osteopathic findings  C2 flexed rotated and side bent right C6 flexed rotated and side bent left T3 extended rotated and side bent right inhaled rib T9 extended rotated and side bent left L2 flexed rotated and side bent right Sacrum right on right       Assessment and Plan:  No problem-specific Assessment & Plan notes found for this encounter.    Nonallopathic problems  Decision today to treat with OMT was based on Physical Exam  After verbal consent patient was treated with HVLA, ME, FPR techniques in cervical, rib, thoracic, lumbar, and sacral  areas  Patient tolerated the procedure well with improvement in symptoms  Patient given exercises, stretches and lifestyle modifications  See medications in patient instructions if given  Patient will follow up in 4-8 weeks             Note: This dictation was prepared with Dragon dictation along with smaller phrase technology. Any transcriptional errors that result from this process are unintentional.

## 2022-04-11 ENCOUNTER — Ambulatory Visit: Payer: 59 | Admitting: Family Medicine

## 2022-04-11 ENCOUNTER — Other Ambulatory Visit: Payer: Self-pay | Admitting: Family Medicine

## 2022-05-09 ENCOUNTER — Other Ambulatory Visit: Payer: Self-pay | Admitting: Family Medicine

## 2022-06-05 NOTE — Progress Notes (Signed)
[name] [address] [place] [address] Phone: [phone] Subjective:   Bruce Donath, am serving as a scribe for Dr. Antoine Primas.  I'm seeing this patient by the request  of:  Farris Has, MD  CC: Low back pain  [id number]  Iyanni Ballas is a 57 y.o. female coming in with complaint of back and neck pain. OMT 03/14/2022. Also f/u for B hip pain. Patient states that she has good and bad days. Lower back and L hip pain recently have not been feeling as bad. Walking seems to be helping. More pain in the neck recently.   Medications patient has been prescribed: Meloxicam  Taking:         Reviewed prior external information including notes and imaging from previsou exam, outside providers and external EMR if available.   As well as notes that were available from care everywhere and other healthcare systems.  Past medical history, social, surgical and family history all reviewed in electronic medical record.  No pertanent information unless stated regarding to the chief complaint.   Past Medical History:  Diagnosis Date   Angina at rest    Anxiety    Chest pain    Diabetes mellitus without complication (HCC)    High cholesterol    High cholesterol    History of angina    History of TIA (transient ischemic attack) 10/11/2020   Hypercholesterolemia 10/11/2020   Hyperlipidemia    Migraine    Personal history of COVID-19 10/11/2020   Radiculopathy of cervical region    TIA (transient ischemic attack)    Transient cerebral ischemic attack    Type II diabetes mellitus (HCC) 10/11/2020    Allergies  Allergen Reactions   Hazelnut (Filbert) Anaphylaxis   Penicillins Anaphylaxis   Coconut (Cocos Nucifera)    Cocos Nucifera      Review of Systems:  No headache, visual changes, nausea, vomiting, diarrhea, constipation, dizziness, abdominal pain, skin rash, fevers, chills, night sweats, weight loss, swollen lymph nodes, body  aches, joint swelling, chest pain, shortness of breath, mood changes. POSITIVE muscle aches  Objective  Blood pressure 114/80, pulse 70, height 5\' 1"  (1.549 m), weight 217 lb (98.4 kg), SpO2 95 %.   General: No apparent distress alert and oriented x3 mood and affect normal, dressed appropriately.  HEENT: Pupils equal, extraocular movements intact  Respiratory: Patient's speak in full sentences and does not appear short of breath  Cardiovascular: No lower extremity edema, non tender, no erythema  Low back exam does have some loss of lordosis noted.  Significant tightness noted throughout.  Neck exam does have limited sidebending bilaterally.  Does have tightness noted right greater than left  Osteopathic findings  C3 flexed rotated and side bent right C5 flexed rotated and side bent right T3 extended rotated and side bent right inhaled rib T7 extended rotated and side bent left L2 flexed rotated and side bent right Sacrum right on right       Assessment and Plan:  Degenerative disc disease, lumbar Degenerative disc disease.  Discussed posture and ergonomics, discussed which activities to do and which ones to avoid, discussed icing regimen.  Discussed home exercises.  Follow-up with me again in 6 to 8 weeks.    Nonallopathic problems  Decision today to treat with OMT was based on Physical Exam  After verbal consent patient was treated with HVLA, ME, FPR techniques in cervical, rib, thoracic, lumbar, and sacral  areas  Patient tolerated the  procedure well with improvement in symptoms  Patient given exercises, stretches and lifestyle modifications  See medications in patient instructions if given  Patient will follow up in 4-8 weeks     The above documentation has been reviewed and is accurate and complete Judi Saa, DO         Note: This dictation was prepared with Dragon dictation along with smaller phrase technology. Any transcriptional errors that result  from this process are unintentional.

## 2022-06-06 ENCOUNTER — Ambulatory Visit: Payer: 59 | Admitting: Family Medicine

## 2022-06-06 ENCOUNTER — Encounter: Payer: Self-pay | Admitting: Family Medicine

## 2022-06-06 VITALS — BP 114/80 | HR 70 | Ht 61.0 in | Wt 217.0 lb

## 2022-06-06 DIAGNOSIS — M9901 Segmental and somatic dysfunction of cervical region: Secondary | ICD-10-CM

## 2022-06-06 DIAGNOSIS — M5136 Other intervertebral disc degeneration, lumbar region: Secondary | ICD-10-CM

## 2022-06-06 DIAGNOSIS — M9904 Segmental and somatic dysfunction of sacral region: Secondary | ICD-10-CM | POA: Diagnosis not present

## 2022-06-06 DIAGNOSIS — M9908 Segmental and somatic dysfunction of rib cage: Secondary | ICD-10-CM | POA: Diagnosis not present

## 2022-06-06 DIAGNOSIS — M9903 Segmental and somatic dysfunction of lumbar region: Secondary | ICD-10-CM | POA: Diagnosis not present

## 2022-06-06 DIAGNOSIS — M9902 Segmental and somatic dysfunction of thoracic region: Secondary | ICD-10-CM

## 2022-06-06 DIAGNOSIS — M51369 Other intervertebral disc degeneration, lumbar region without mention of lumbar back pain or lower extremity pain: Secondary | ICD-10-CM

## 2022-06-06 NOTE — Assessment & Plan Note (Signed)
Degenerative disc disease.  Discussed posture and ergonomics, discussed which activities to do and which ones to avoid, discussed icing regimen.  Discussed home exercises.  Follow-up with me again in 6 to 8 weeks.

## 2022-06-06 NOTE — Patient Instructions (Signed)
Great to see you Don't let [employer] kill you See me again in 2 months

## 2022-06-08 IMAGING — MR MR LUMBAR SPINE W/O CM
4 of 5 series · 18 of 48 positions shown · non-contrast
Comparison: X-ray lumbar 01/29/2021.

CLINICAL DATA: Myelopathy. Acute low back pain for 2 months.

Lumbar spine pain X82.80 (T0F-1D-CM).
EXAM:
MRI LUMBAR SPINE WITHOUT CONTRAST
TECHNIQUE: Multiplanar, multisequence MR imaging of the lumbar spine was
performed. No intravenous contrast was administered.

[Series 9: T2 · sagittal · 4.0mm · 0.73mm/px · 6 of 14 slices shown (1 of 2)]
[im 1/14]
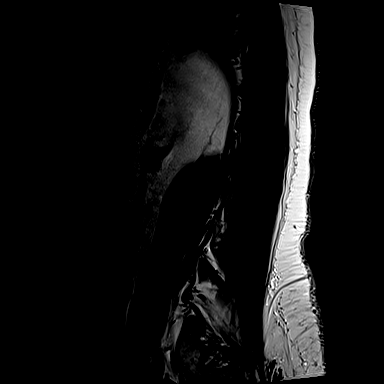
[im 3/14]
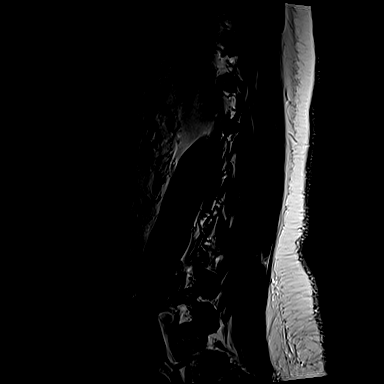
[im 6/14]
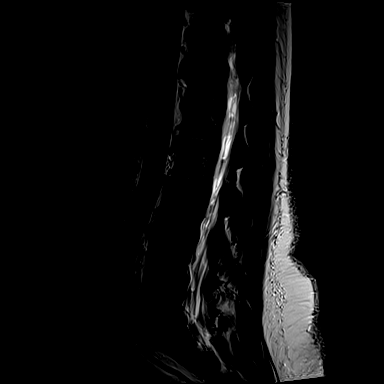
[im 8/14]
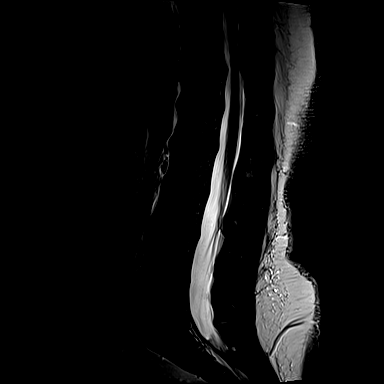
[im 11/14]
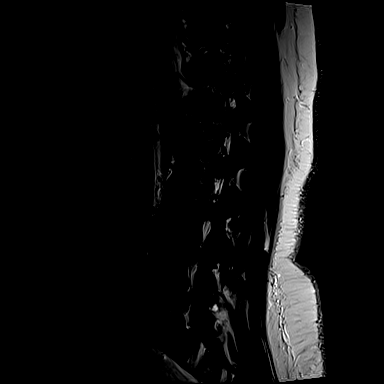
[im 14/14]
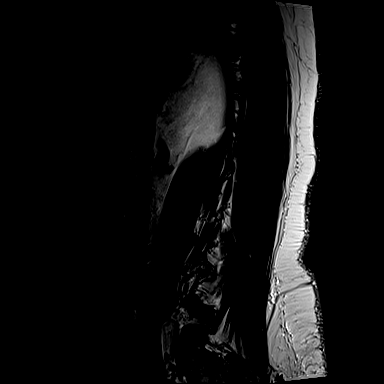

[Series 10: T1 · sagittal · 4.0mm · 0.73mm/px · 3 of 14 slices shown (1 of 2)]
[im 3/14]
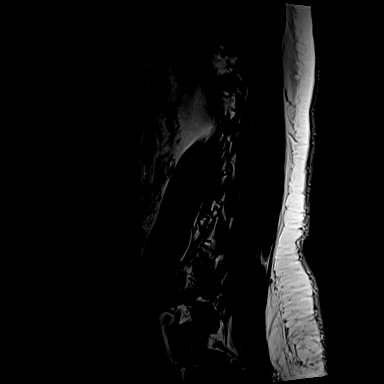
[im 8/14]
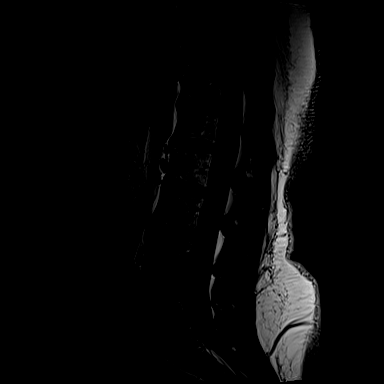
[im 14/14]
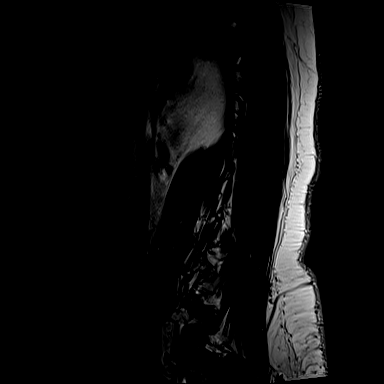

[Series 14: T1 · axial · 4.0mm · 0.28mm/px · z∈[+21,+167]mm · 3 of 37 slices shown (2 of 2)]
[im 6/37]
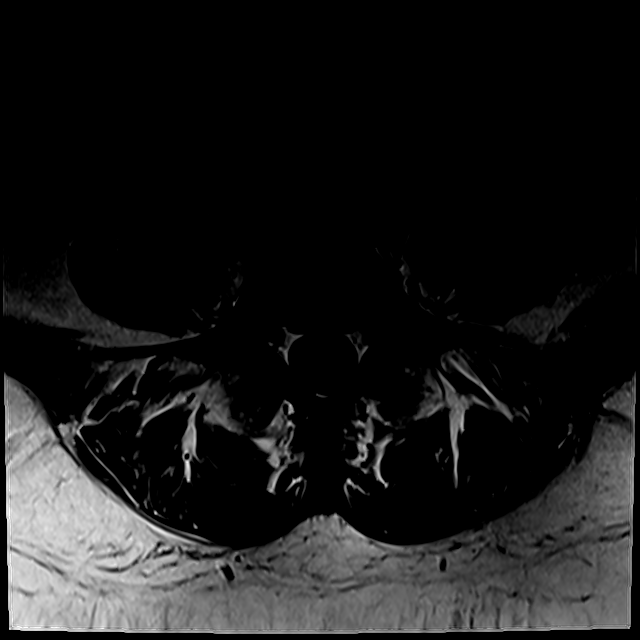
[im 19/37]
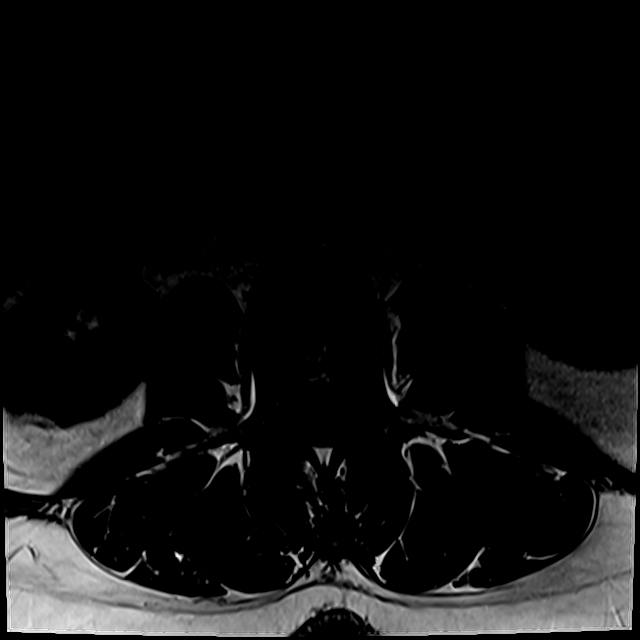
[im 31/37]
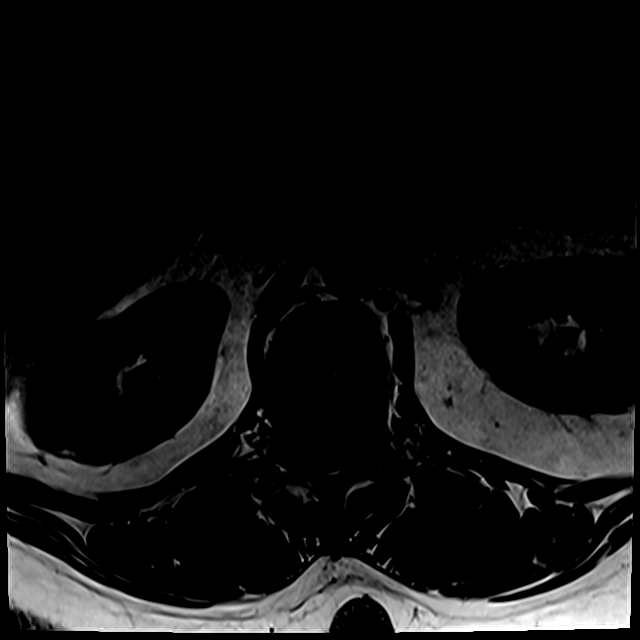

[Series 17: T2 · axial · 4.0mm · 0.28mm/px · z∈[-4,+167]mm · 6 of 37 slices shown (2 of 2)]
[im 1/37]
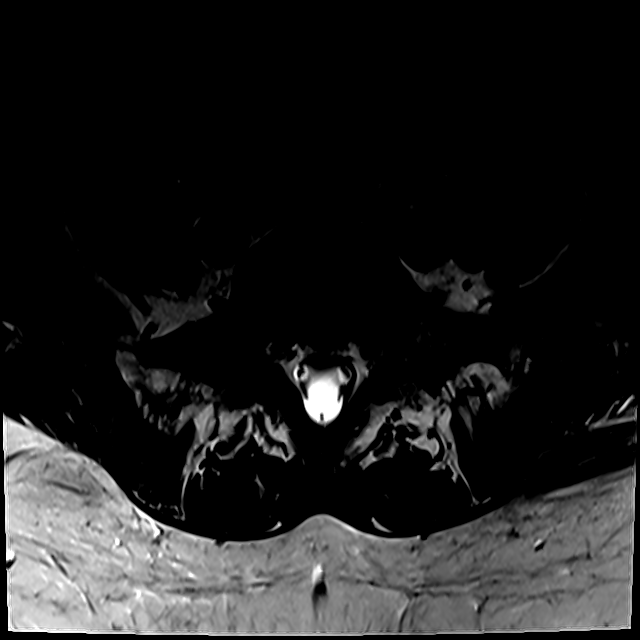
[im 6/37]
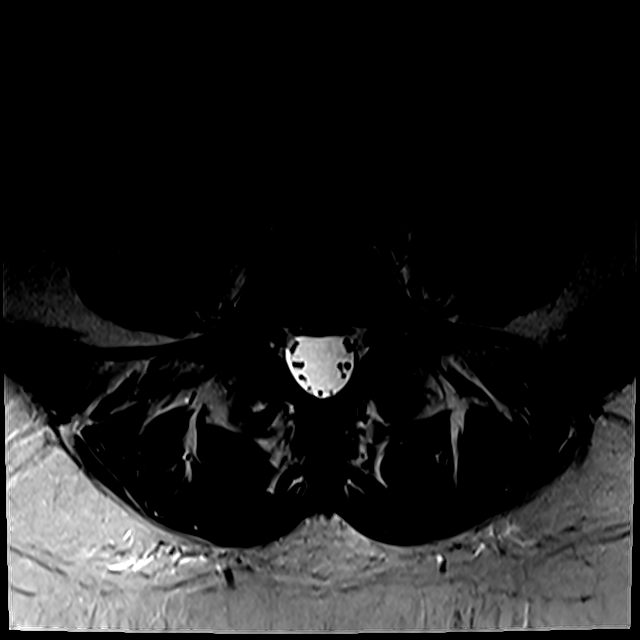
[im 11/37]
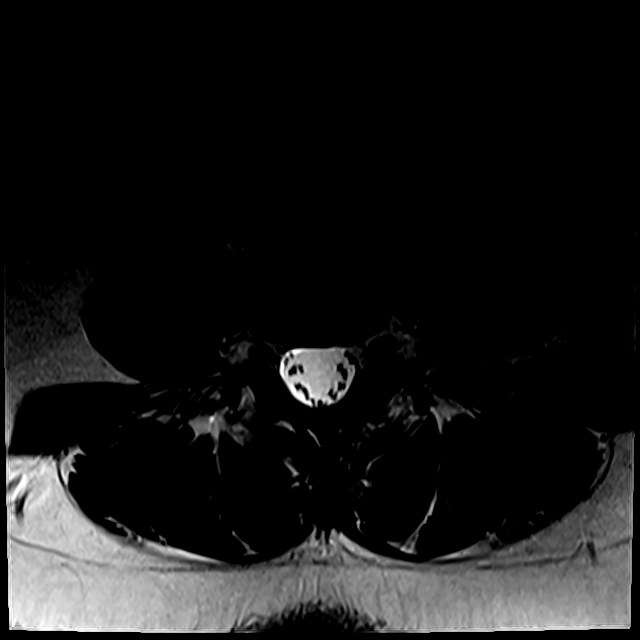
[im 16/37]
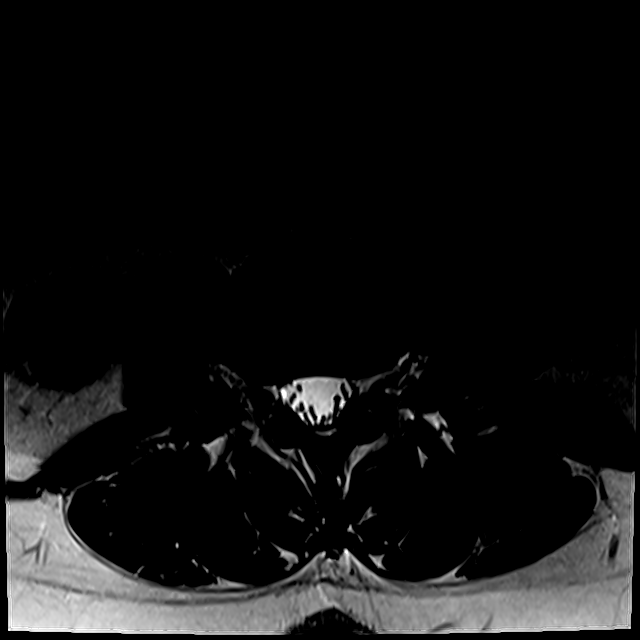
[im 19/37]
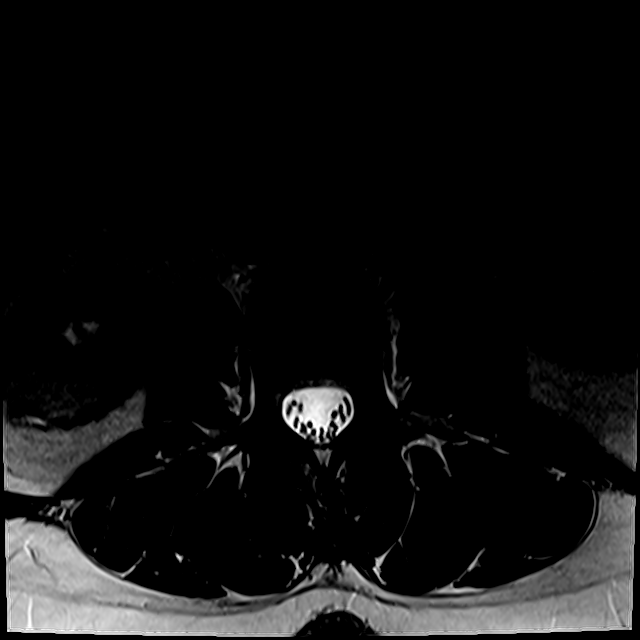
[im 31/37]
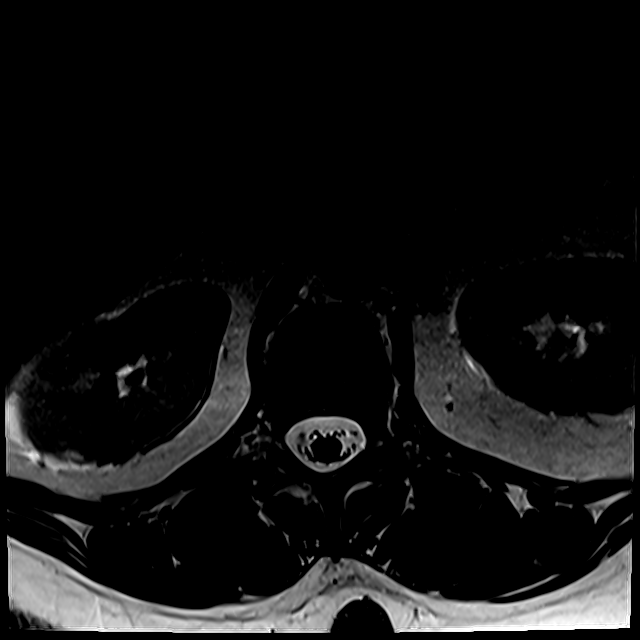

[18 of 48 positions shown; findings below may reference images not displayed]

FINDINGS: Segmentation: Assumed standard segmentation with hypoplastic ribs at
L1, correlating with prior radiographs.

Alignment:  Trace anterolisthesis of T11 over T12.

Vertebrae: No fracture, evidence of discitis, or bone lesion.
Endplate degenerative changes at L5-S1.

Conus medullaris and cauda equina: Conus extends to the L2 level.
Conus and cauda equina appear normal.

Paraspinal and other soft tissues: Negative.

Disc levels:

T11-12: Only evaluated on sagittal views. Disc bulge/disc uncovering
causing indentation on the thecal sac. Facet degenerative changes.
No high-grade spinal canal or neural foraminal stenosis.

T12-L1: No spinal canal or neural foraminal stenosis.

L1-2: Mild facet degenerative changes. No spinal canal or neural
foraminal stenosis.

L2-3: Mild facet degenerative changes. No spinal canal or neural
foraminal stenosis.

L3-4: Shallow disc bulge and moderate facet degenerative changes. No
significant spinal canal or neural foraminal stenosis.

L4-5: Tiny left far lateral disc protrusion and moderate facet
degenerative changes. No significant spinal canal or neural
foraminal stenosis.

L5-S1: Shallow disc bulge and moderate facet degenerative changes.
No significant spinal canal or neural foraminal stenosis.
IMPRESSION: Mild degenerative changes of the lumbar spine, more pronounced at
the level of the facet joints at L3-4, L4-5 and L5-S1. No high-grade
spinal canal or neural foraminal stenosis at any level.

## 2022-06-13 ENCOUNTER — Other Ambulatory Visit: Payer: Self-pay | Admitting: Family Medicine

## 2022-07-07 ENCOUNTER — Other Ambulatory Visit: Payer: Self-pay | Admitting: Family Medicine

## 2022-07-16 ENCOUNTER — Other Ambulatory Visit: Payer: Self-pay | Admitting: Family Medicine

## 2022-08-11 NOTE — Progress Notes (Signed)
[name] [address] [place] [address] Phone: [phone] Subjective:   [name], am serving as a scribe for Dr. Antoine Primas.  I'm seeing this patient by the request  of:  Farris Has, MD  CC: Back and neck pain follow-up  [id number]  Hailey [name] is a 57 y.o. female coming in with complaint of back and neck pain. OMT 06/06/2022. Patient states same per usual. No new concerns. Refill meloxicam and ibuprofen?  Medications patient has been prescribed: Meloxicam  Taking:         Reviewed prior external information including notes and imaging from previsou exam, outside providers and external EMR if available.   As well as notes that were available from care everywhere and other healthcare systems.  Past medical history, social, surgical and family history all reviewed in electronic medical record.  No pertanent information unless stated regarding to the chief complaint.   Past Medical History:  Diagnosis Date   Angina at rest    Anxiety    Chest pain    Diabetes mellitus without complication (HCC)    High cholesterol    High cholesterol    History of angina    History of TIA (transient ischemic attack) 10/11/2020   Hypercholesterolemia 10/11/2020   Hyperlipidemia    Migraine    Personal history of COVID-19 10/11/2020   Radiculopathy of cervical region    TIA (transient ischemic attack)    Transient cerebral ischemic attack    Type II diabetes mellitus (HCC) 10/11/2020    Allergies  Allergen Reactions   Hazelnut (Filbert) Anaphylaxis   Penicillins Anaphylaxis   Coconut (Cocos Nucifera)    Cocos Nucifera      Review of Systems:  No headache, visual changes, nausea, vomiting, diarrhea, constipation, dizziness, abdominal pain, skin rash, fevers, chills, night sweats, weight loss, swollen lymph nodes, body aches, joint swelling, chest pain, shortness of breath, mood changes. POSITIVE muscle aches  Objective   Blood pressure 110/78, pulse 75, height 5\' 1"  (1.549 m), weight 218 lb (98.9 kg), SpO2 95%.   General: No apparent distress alert and oriented x3 mood and affect normal, dressed appropriately.  HEENT: Pupils equal, extraocular movements intact  Respiratory: Patient's speak in full sentences and does not appear short of breath  Cardiovascular: No lower extremity edema, non tender, no erythema  Back exam does have significant tightness noted.  Seems to be more on the right side of the lower back.  Significant tightness also noted on the right side of the neck.  Osteopathic findings  C3 flexed rotated and side bent right C6 flexed rotated and side bent right T5 extended rotated and side bent right inhaled rib significant tightness noted in this area. T7 extended rotated and side bent left L2 flexed rotated and side bent right L4 flexed rotated and side bent right Sacrum right on right       Assessment and Plan:  Degenerative disc disease, cervical Continue degenerative disc disease.  Seems to be on the right side mostly today.  Seems to be the neck and the lower back.  Discussed icing regimen and home exercises.  Discussed continuing to watch posture and ergonomics.  Refilled meloxicam and ibuprofen the patient knows not to take together.  Patient likes to do he has been differently when needed.  Follow-up again in 6 to 8 weeks otherwise.    Nonallopathic problems  Decision today to treat with OMT was based on Physical Exam  After verbal  consent patient was treated with HVLA, ME, FPR techniques in cervical, rib, thoracic, lumbar, and sacral  areas  Patient tolerated the procedure well with improvement in symptoms  Patient given exercises, stretches and lifestyle modifications  See medications in patient instructions if given  Patient will follow up in 4-8 weeks     The above documentation has been reviewed and is accurate and complete Judi Saa, DO          Note: This dictation was prepared with Dragon dictation along with smaller phrase technology. Any transcriptional errors that result from this process are unintentional.

## 2022-08-15 ENCOUNTER — Ambulatory Visit: Payer: 59 | Admitting: Family Medicine

## 2022-08-15 ENCOUNTER — Encounter: Payer: Self-pay | Admitting: Family Medicine

## 2022-08-15 VITALS — BP 110/78 | HR 75 | Ht 61.0 in | Wt 218.0 lb

## 2022-08-15 DIAGNOSIS — M9904 Segmental and somatic dysfunction of sacral region: Secondary | ICD-10-CM | POA: Diagnosis not present

## 2022-08-15 DIAGNOSIS — M9902 Segmental and somatic dysfunction of thoracic region: Secondary | ICD-10-CM | POA: Diagnosis not present

## 2022-08-15 DIAGNOSIS — M9901 Segmental and somatic dysfunction of cervical region: Secondary | ICD-10-CM | POA: Diagnosis not present

## 2022-08-15 DIAGNOSIS — M5030 Other cervical disc degeneration, unspecified cervical region: Secondary | ICD-10-CM

## 2022-08-15 DIAGNOSIS — M9908 Segmental and somatic dysfunction of rib cage: Secondary | ICD-10-CM

## 2022-08-15 DIAGNOSIS — M9903 Segmental and somatic dysfunction of lumbar region: Secondary | ICD-10-CM

## 2022-08-15 MED ORDER — IBUPROFEN 800 MG PO TABS: 800.0000 mg | ORAL_TABLET | Freq: Three times a day (TID) | ORAL | 1 refills | Status: DC | PRN

## 2022-08-15 MED ORDER — MELOXICAM 15 MG PO TABS: 15.0000 mg | ORAL_TABLET | Freq: Every day | ORAL | 0 refills | Status: DC

## 2022-08-15 NOTE — Patient Instructions (Addendum)
Good to see you! Enjoy a little alone time Prescriptions refilled See you again in 6-8 weeks

## 2022-08-15 NOTE — Assessment & Plan Note (Signed)
Continue degenerative disc disease.  Seems to be on the right side mostly today.  Seems to be the neck and the lower back.  Discussed icing regimen and home exercises.  Discussed continuing to watch posture and ergonomics.  Refilled meloxicam and ibuprofen the patient knows not to take together.  Patient likes to do he has been differently when needed.  Follow-up again in 6 to 8 weeks otherwise.

## 2022-10-09 ENCOUNTER — Encounter: Payer: Self-pay | Admitting: Family Medicine

## 2022-10-09 ENCOUNTER — Other Ambulatory Visit: Payer: Self-pay | Admitting: Family Medicine

## 2022-10-09 DIAGNOSIS — Z1231 Encounter for screening mammogram for malignant neoplasm of breast: Secondary | ICD-10-CM

## 2022-10-09 NOTE — Progress Notes (Signed)
[name] [address] [place] [address] Phone: [phone] Subjective:   Bruce Donath, am serving as a scribe for Dr. Antoine Primas.  I'm seeing this patient by the request  of:  Farris Has, MD  CC: back and neck pain follow up   [id number]  Hailey [name] is a 57 y.o. female coming in with complaint of back and neck pain. OMT 08/15/2022. Patient states that she has been lifting   Medications patient has been prescribed: Advil, Meloxicam  Taking:         Reviewed prior external information including notes and imaging from previsou exam, outside providers and external EMR if available.   As well as notes that were available from care everywhere and other healthcare systems.  Past medical history, social, surgical and family history all reviewed in electronic medical record.  No pertanent information unless stated regarding to the chief complaint.   Past Medical History:  Diagnosis Date   Angina at rest    Anxiety    Chest pain    Diabetes mellitus without complication (HCC)    High cholesterol    High cholesterol    History of angina    History of TIA (transient ischemic attack) 10/11/2020   Hypercholesterolemia 10/11/2020   Hyperlipidemia    Migraine    Personal history of COVID-19 10/11/2020   Radiculopathy of cervical region    TIA (transient ischemic attack)    Transient cerebral ischemic attack    Type II diabetes mellitus (HCC) 10/11/2020    Allergies  Allergen Reactions   Hazelnut (Filbert) Anaphylaxis   Penicillins Anaphylaxis   Coconut (Cocos Nucifera)    Cocos Nucifera      Review of Systems:  No headache, visual changes, nausea, vomiting, diarrhea, constipation, dizziness, abdominal pain, skin rash, fevers, chills, night sweats, weight loss, swollen lymph nodes, body aches, joint swelling, chest pain, shortness of breath, mood changes. POSITIVE muscle aches  Objective  Blood pressure 110/72,  height 5\' 1"  (1.549 m), weight 210 lb (95.3 kg).   General: No apparent distress alert and oriented x3 mood and affect normal, dressed appropriately.  HEENT: Pupils equal, extraocular movements intact  Respiratory: Patient's speak in full sentences and does not appear short of breath  Cardiovascular: No lower extremity edema, non tender, no erythema  Back and neck exam does have some loss of lordosis tightness noted in the paraspinal musculature and especially around the thoracolumbar juncture.  Patient does have tightness on the bilateral musculature.  Osteopathic findings  C3 flexed rotated and side bent right C5 flexed rotated and side bent left T5 extended rotated and side bent right inhaled rib T7 extended rotated and side bent left inhaled rib L3 flexed rotated and side bent right L5 flexed rotated and side bent left Sacrum right on right       Assessment and Plan:  Degenerative disc disease, cervical Discussed HEP  Discussed which activities  The patient is going to continue to stay active otherwise.  Discussed icing regimen and home exercises.  Discussed which activities to do and which ones to avoid.  Increase activity slowly.  Follow-up with me again in 6 to 8 weeks otherwise.  Degenerative disc disease, lumbar Discussed posture and ergonomics discussed which activities to do and which ones to avoid.  RTC in 6 weeks  Continue to work on  weight loss  Patient is going to continue to increase activity as tolerated.  Discussed icing regimen and home exercises, discussed  which activities to do and which ones to avoid.  Increase activity slowly over the course of next several weeks.  Follow-up with me again in 6 to 8 weeks otherwise.    Nonallopathic problems  Decision today to treat with OMT was based on Physical Exam  After verbal consent patient was treated with HVLA, ME, FPR techniques in cervical, rib, thoracic, lumbar, and sacral  areas  Patient tolerated the  procedure well with improvement in symptoms  Patient given exercises, stretches and lifestyle modifications  See medications in patient instructions if given  Patient will follow up in 4-8 weeks    The above documentation has been reviewed and is accurate and complete Judi Saa, DO          Note: This dictation was prepared with Dragon dictation along with smaller phrase technology. Any transcriptional errors that result from this process are unintentional.

## 2022-10-10 ENCOUNTER — Encounter: Payer: Self-pay | Admitting: Family Medicine

## 2022-10-10 ENCOUNTER — Ambulatory Visit: Payer: 59 | Admitting: Family Medicine

## 2022-10-10 VITALS — BP 110/72 | Ht 61.0 in | Wt 210.0 lb

## 2022-10-10 DIAGNOSIS — M9902 Segmental and somatic dysfunction of thoracic region: Secondary | ICD-10-CM

## 2022-10-10 DIAGNOSIS — M9901 Segmental and somatic dysfunction of cervical region: Secondary | ICD-10-CM

## 2022-10-10 DIAGNOSIS — M9908 Segmental and somatic dysfunction of rib cage: Secondary | ICD-10-CM

## 2022-10-10 DIAGNOSIS — M5030 Other cervical disc degeneration, unspecified cervical region: Secondary | ICD-10-CM | POA: Diagnosis not present

## 2022-10-10 DIAGNOSIS — M5136 Other intervertebral disc degeneration, lumbar region: Secondary | ICD-10-CM | POA: Diagnosis not present

## 2022-10-10 DIAGNOSIS — M9903 Segmental and somatic dysfunction of lumbar region: Secondary | ICD-10-CM

## 2022-10-10 DIAGNOSIS — M9904 Segmental and somatic dysfunction of sacral region: Secondary | ICD-10-CM | POA: Diagnosis not present

## 2022-10-10 NOTE — Patient Instructions (Signed)
Dont let the Christmas trees kill ya See me in 5-6 weeks

## 2022-10-10 NOTE — Assessment & Plan Note (Signed)
Discussed HEP  Discussed which activities  The patient is going to continue to stay active otherwise.  Discussed icing regimen and home exercises.  Discussed which activities to do and which ones to avoid.  Increase activity slowly.  Follow-up with me again in 6 to 8 weeks otherwise.

## 2022-10-10 NOTE — Assessment & Plan Note (Signed)
Discussed posture and ergonomics discussed which activities to do and which ones to avoid.  RTC in 6 weeks  Continue to work on  weight loss  Patient is going to continue to increase activity as tolerated.  Discussed icing regimen and home exercises, discussed which activities to do and which ones to avoid.  Increase activity slowly over the course of next several weeks.  Follow-up with me again in 6 to 8 weeks otherwise.

## 2022-10-13 ENCOUNTER — Other Ambulatory Visit: Payer: Self-pay | Admitting: Family Medicine

## 2022-10-13 DIAGNOSIS — N644 Mastodynia: Secondary | ICD-10-CM

## 2022-10-24 ENCOUNTER — Ambulatory Visit
Admission: RE | Admit: 2022-10-24 | Discharge: 2022-10-24 | Disposition: A | Discharge status: Home / Self Care | Payer: 59 | Source: Ambulatory Visit | Attending: Family Medicine | Admitting: Family Medicine

## 2022-10-24 DIAGNOSIS — N644 Mastodynia: Secondary | ICD-10-CM

## 2022-11-02 ENCOUNTER — Encounter: Payer: Self-pay | Admitting: Family Medicine

## 2022-11-03 ENCOUNTER — Ambulatory Visit: Payer: 59 | Admitting: Sports Medicine

## 2022-11-03 VITALS — BP 118/80 | HR 92 | Ht 61.0 in | Wt 207.0 lb

## 2022-11-03 DIAGNOSIS — M25511 Pain in right shoulder: Secondary | ICD-10-CM | POA: Diagnosis not present

## 2022-11-03 DIAGNOSIS — S46011A Strain of muscle(s) and tendon(s) of the rotator cuff of right shoulder, initial encounter: Secondary | ICD-10-CM

## 2022-11-03 NOTE — Progress Notes (Signed)
Hailey [name] [facility] [address] [place] [address] Phone: [phone]   Assessment and Plan:     1. Acute pain of right shoulder 2. Rotator cuff strain, right, initial encounter  -Acute, uncomplicated, initial sports medicine visit - 3 days of right shoulder pain most consistent with rotator cuff strain and subacromial bursitis occurring during lifting water cases. - No relief with 3 days of NSAID use including meloxicam and 800 mg ibuprofen, so patient elected for subacromial CSI.  Tolerated well per note below - May continue to use NSAIDs/Tylenol as needed for day-to-day pain relief - Start HEP with goal of gradually improving ROM  Procedure: Subacromial Injection Side: Right  Risks explained and consent was given verbally. The site was cleaned with alcohol prep. A steroid injection was performed from posterior approach using 4 mL of 1% lidocaine without epinephrine and 2 mL of kenalog 40mg /ml. This was well tolerated and resulted in symptomatic relief.  Needle was removed, hemostasis achieved, and post injection instructions were explained.   Pt was advised to call or return to clinic if these symptoms worsen or fail to improve as anticipated.    Pertinent previous records reviewed include none   Follow Up: As needed if no improvement or worsening of symptoms.  Would obtain right shoulder x-ray and could consider ultrasound to ensure no rotator cuff tear   Subjective:   I, Hailey [name], am serving as a Neurosurgeon for Doctor Richardean Sale  Chief Complaint: right shoulder pain   HPI:   11/03/22 Patient is a 57 year old female complaining of right shoulder pain. 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 Not dislocated. Decreased ROM. Meloxicam , ibu 800mg  didn't help. No numbness or tingling.   Relevant Historical Information: DM type II  Additional pertinent review of systems negative.   Current  Outpatient Medications:    amLODipine (NORVASC) 5 MG tablet, Take 1 tablet (5 mg total) by mouth daily., Disp: 90 tablet, Rfl: 3   aspirin EC 81 MG tablet, Take 81 mg by mouth daily. Swallow whole., Disp: , Rfl:    busPIRone (BUSPAR) 5 MG tablet, Take 5 mg by mouth daily as needed., Disp: , Rfl:    EPINEPHrine 0.3 mg/0.3 mL IJ SOAJ injection, as needed., Disp: , Rfl:    furosemide (LASIX) 40 MG tablet, Take 20 mg by mouth daily., Disp: , Rfl:    hydrochlorothiazide (HYDRODIURIL) 25 MG tablet, Take 25 mg by mouth daily., Disp: , Rfl:    ibuprofen (ADVIL) 800 MG tablet, Take 1 tablet (800 mg total) by mouth 3 (three) times daily as needed., Disp: 270 tablet, Rfl: 1   meloxicam (MOBIC) 15 MG tablet, Take 1 tablet (15 mg total) by mouth daily., Disp: 30 tablet, Rfl: 0   naproxen (NAPROSYN) 500 MG tablet, as needed for pain., Disp: , Rfl:    nitroGLYCERIN (NITROSTAT) 0.4 MG SL tablet, Place 1 tablet (0.4 mg total) under the tongue every 5 (five) minutes as needed for chest pain., Disp: 25 tablet, Rfl: 3   OZEMPIC, 0.25 OR 0.5 MG/DOSE, 2 MG/3ML SOPN, Inject into the skin once a week. Every friday, Disp: , Rfl:    predniSONE (DELTASONE) 20 MG tablet, Take 2 tablets (40 mg total) by mouth daily with breakfast., Disp: 10 tablet, Rfl: 0   rosuvastatin (CRESTOR) 10 MG tablet, Take 1 tablet (10 mg total) by mouth daily., Disp: 90 tablet, Rfl: 3   tiZANidine (ZANAFLEX)  2 MG tablet, TAKE ONE TABLET BY MOUTH DAILY AT BEDTIME, Disp: 30 tablet, Rfl: 0   Objective:     Vitals:   11/03/22 1000  BP: 118/80  Pulse: 92  Weight: 207 lb (93.9 kg)  Height: 5\' 1"  (1.549 m)      Body mass index is 39.11 kg/m.    Physical Exam:    Gen: Appears well, nad, nontoxic and pleasant Neuro:sensation intact, strength is 5/5 with df/pf/inv/ev, muscle tone wnl Skin: no suspicious lesion or defmority Psych: A&O, appropriate mood and affect  Right shoulder:  No deformity, swelling or muscle wasting No scapular  winging FF 70, abd 60, int 20, ext 60 Mild global nonspecific TTP Special testing limited due to limited ROM Neg ant drawer, sulcus sign, apprehension Negative Spurling's test bilat FROM of neck  Mild pain with maintained strength with resisted shoulder abduction, internal rotation, external rotation  Electronically signed by:  Hailey [name] [facility] 10:21 AM 11/03/22

## 2022-11-03 NOTE — Telephone Encounter (Signed)
Scheduled 9/30 with Dr. Jean Rosenthal.

## 2022-11-03 NOTE — Patient Instructions (Signed)
Shoulder HEP  As needed follow up

## 2022-11-10 ENCOUNTER — Other Ambulatory Visit: Payer: Self-pay | Admitting: Nurse Practitioner

## 2022-11-10 DIAGNOSIS — N644 Mastodynia: Secondary | ICD-10-CM

## 2022-11-10 DIAGNOSIS — R9230 Dense breasts, unspecified: Secondary | ICD-10-CM

## 2022-11-21 ENCOUNTER — Ambulatory Visit: Payer: 59 | Admitting: Family Medicine

## 2022-11-21 NOTE — Progress Notes (Unsigned)
[name] [address] [place] [address] Phone: [phone] Subjective:   Hailey [name], am serving as a scribe for Dr. Antoine Primas.  I'm seeing this patient by the request  of:  Farris Has, MD  CC:  Back pain and neck pain follow-up [id number]  Hailey [name] is a 57 y.o. female coming in with complaint of back and neck pain. OMT 10/10/2022. Saw Dr. Jean Rosenthal for R shoulder pain. Given injection. Patient states that her shoulder is doing a lot better. Was unable to move arm overhead. Insidious onset of pain.   Here for maintenance appointment.   Medications patient has been prescribed: IBU, meloxicam  Taking:         Reviewed prior external information including notes and imaging from previsou exam, outside providers and external EMR if available.   As well as notes that were available from care everywhere and other healthcare systems.  Past medical history, social, surgical and family history all reviewed in electronic medical record.  No pertanent information unless stated regarding to the chief complaint.   Past Medical History:  Diagnosis Date   Angina at rest    Anxiety    Chest pain    Diabetes mellitus without complication (HCC)    High cholesterol    High cholesterol    History of angina    History of TIA (transient ischemic attack) 10/11/2020   Hypercholesterolemia 10/11/2020   Hyperlipidemia    Migraine    Personal history of COVID-19 10/11/2020   Radiculopathy of cervical region    TIA (transient ischemic attack)    Transient cerebral ischemic attack    Type II diabetes mellitus (HCC) 10/11/2020    Allergies  Allergen Reactions   Hazelnut (Filbert) Anaphylaxis   Penicillins Anaphylaxis   Coconut (Cocos Nucifera)    Cocos Nucifera      Review of Systems:  No headache, visual changes, nausea, vomiting, diarrhea, constipation, dizziness, abdominal pain, skin rash, fevers, chills, night sweats, weight  loss, swollen lymph nodes, body aches, joint swelling, chest pain, shortness of breath, mood changes. POSITIVE muscle aches  Objective  Blood pressure 118/78, pulse (!) 54, height 5\' 1"  (1.549 m), weight 210 lb (95.3 kg), SpO2 96%.   General: No apparent distress alert and oriented x3 mood and affect normal, dressed appropriately.  HEENT: Pupils equal, extraocular movements intact  Respiratory: Patient's speak in full sentences and does not appear short of breath  Cardiovascular: No lower extremity edema, non tender, no erythema  Low back does have some loss lordosis noted.  Some tenderness to palpation of the paraspinal musculature.  Tightness with extension of the back noted.  Osteopathic findings  C2 flexed rotated and side bent right C6 flexed rotated and side bent left T3 extended rotated and side bent right inhaled rib T9 extended rotated and side bent left L2 flexed rotated and side bent right L3 flexed rotated and side bent left L4 flexed rotated and side bent right Sacrum right on right       Assessment and Plan:  Degenerative disc disease, lumbar Exacerbation with patient having some difficulty as well.  Discussed icing regimen of home exercises, discussed which activities to do and which ones to avoid.  Increase activity slowly otherwise.  Refilled medications accordingly.  Follow-up again in 6 to 8 weeks.    Nonallopathic problems  Decision today to treat with OMT was based on Physical Exam  After verbal consent patient was treated with HVLA, ME, FPR  techniques in cervical, rib, thoracic, lumbar, and sacral  areas  Patient tolerated the procedure well with improvement in symptoms  Patient given exercises, stretches and lifestyle modifications  See medications in patient instructions if given  Patient will follow up in 4-8 weeks    The above documentation has been reviewed and is accurate and complete Judi Saa, DO          Note: This dictation  was prepared with Dragon dictation along with smaller phrase technology. Any transcriptional errors that result from this process are unintentional.

## 2022-11-24 ENCOUNTER — Ambulatory Visit: Payer: 59 | Admitting: Family Medicine

## 2022-11-24 ENCOUNTER — Encounter: Payer: Self-pay | Admitting: Family Medicine

## 2022-11-24 VITALS — BP 118/78 | HR 54 | Ht 61.0 in | Wt 210.0 lb

## 2022-11-24 DIAGNOSIS — M9901 Segmental and somatic dysfunction of cervical region: Secondary | ICD-10-CM

## 2022-11-24 DIAGNOSIS — M9908 Segmental and somatic dysfunction of rib cage: Secondary | ICD-10-CM

## 2022-11-24 DIAGNOSIS — M9902 Segmental and somatic dysfunction of thoracic region: Secondary | ICD-10-CM | POA: Diagnosis not present

## 2022-11-24 DIAGNOSIS — M9903 Segmental and somatic dysfunction of lumbar region: Secondary | ICD-10-CM

## 2022-11-24 DIAGNOSIS — M51362 Other intervertebral disc degeneration, lumbar region with discogenic back pain and lower extremity pain: Secondary | ICD-10-CM | POA: Diagnosis not present

## 2022-11-24 DIAGNOSIS — M9904 Segmental and somatic dysfunction of sacral region: Secondary | ICD-10-CM | POA: Diagnosis not present

## 2022-11-24 NOTE — Assessment & Plan Note (Signed)
Exacerbation with patient having some difficulty as well.  Discussed icing regimen of home exercises, discussed which activities to do and which ones to avoid.  Increase activity slowly otherwise.  Refilled medications accordingly.  Follow-up again in 6 to 8 weeks.

## 2022-11-24 NOTE — Patient Instructions (Signed)
I cannot write you that prescription Keep [place] updated on cramping See me in 6-8 weeks

## 2022-12-05 ENCOUNTER — Encounter: Payer: Self-pay | Admitting: Nurse Practitioner

## 2022-12-20 ENCOUNTER — Ambulatory Visit
Admission: RE | Admit: 2022-12-20 | Discharge: 2022-12-20 | Disposition: A | Discharge status: Home / Self Care | Payer: Managed Care, Other (non HMO) | Source: Ambulatory Visit | Attending: Nurse Practitioner

## 2022-12-20 DIAGNOSIS — N644 Mastodynia: Secondary | ICD-10-CM

## 2022-12-20 DIAGNOSIS — R9230 Dense breasts, unspecified: Secondary | ICD-10-CM

## 2022-12-20 MED ORDER — GADOPICLENOL 0.5 MMOL/ML IV SOLN
9.0000 mL | Freq: Once | INTRAVENOUS | Status: AC | PRN
  Administered 2022-12-20: 10 mL via INTRAVENOUS

## 2023-01-15 NOTE — Progress Notes (Unsigned)
  [name] [address] [place] [address] Phone: [phone] Subjective:    I'm seeing this patient by the request  of:  Farris Has, MD  CC:   [id number]  Hailey [name] is a 57 y.o. female coming in with complaint of back and neck pain. OMT 11/24/2022. Patient states   Medications patient has been prescribed: None  Taking:         Reviewed prior external information including notes and imaging from previsou exam, outside providers and external EMR if available.   As well as notes that were available from care everywhere and other healthcare systems.  Past medical history, social, surgical and family history all reviewed in electronic medical record.  No pertanent information unless stated regarding to the chief complaint.   Past Medical History:  Diagnosis Date   Angina at rest [facility])    Anxiety    Chest pain    Diabetes mellitus without complication (HCC)    High cholesterol    High cholesterol    History of angina    History of TIA (transient ischemic attack) 10/11/2020   Hypercholesterolemia 10/11/2020   Hyperlipidemia    Migraine    Personal history of COVID-19 10/11/2020   Radiculopathy of cervical region    TIA (transient ischemic attack)    Transient cerebral ischemic attack    Type II diabetes mellitus (HCC) 10/11/2020    Allergies  Allergen Reactions   Hazelnut (Filbert) Anaphylaxis   Penicillins Anaphylaxis   Coconut (Cocos Nucifera)    Cocos Nucifera      Review of Systems:  No headache, visual changes, nausea, vomiting, diarrhea, constipation, dizziness, abdominal pain, skin rash, fevers, chills, night sweats, weight loss, swollen lymph nodes, body aches, joint swelling, chest pain, shortness of breath, mood changes. POSITIVE muscle aches  Objective  There were no vitals taken for this visit.   General: No apparent distress alert and oriented x3 mood and affect normal, dressed appropriately.   HEENT: Pupils equal, extraocular movements intact  Respiratory: Patient's speak in full sentences and does not appear short of breath  Cardiovascular: No lower extremity edema, non tender, no erythema  Gait MSK:  Back   Osteopathic findings  C2 flexed rotated and side bent right C6 flexed rotated and side bent left T3 extended rotated and side bent right inhaled rib T9 extended rotated and side bent left L2 flexed rotated and side bent right Sacrum right on right       Assessment and Plan:  No problem-specific Assessment & Plan notes found for this encounter.    Nonallopathic problems  Decision today to treat with OMT was based on Physical Exam  After verbal consent patient was treated with HVLA, ME, FPR techniques in cervical, rib, thoracic, lumbar, and sacral  areas  Patient tolerated the procedure well with improvement in symptoms  Patient given exercises, stretches and lifestyle modifications  See medications in patient instructions if given  Patient will follow up in 4-8 weeks             Note: This dictation was prepared with Dragon dictation along with smaller phrase technology. Any transcriptional errors that result from this process are unintentional.

## 2023-01-16 ENCOUNTER — Ambulatory Visit: Payer: Managed Care, Other (non HMO) | Admitting: Family Medicine

## 2023-01-29 ENCOUNTER — Other Ambulatory Visit: Payer: Self-pay | Admitting: Family Medicine

## 2023-02-18 ENCOUNTER — Ambulatory Visit (INDEPENDENT_AMBULATORY_CARE_PROVIDER_SITE_OTHER): Payer: 59

## 2023-02-18 ENCOUNTER — Ambulatory Visit: Payer: 59 | Admitting: Sports Medicine

## 2023-02-18 VITALS — BP 120/82 | HR 89 | Ht 61.0 in | Wt 210.0 lb

## 2023-02-18 DIAGNOSIS — M25561 Pain in right knee: Secondary | ICD-10-CM

## 2023-02-18 MED ORDER — MELOXICAM 15 MG PO TABS: 15.0000 mg | ORAL_TABLET | Freq: Every day | ORAL | 0 refills | Status: DC

## 2023-02-18 NOTE — Progress Notes (Signed)
 Ben Ladainian Therien [facility] [address] [place] [address] Phone: [phone]   Assessment and Plan:     1. Acute pain of right knee -Acute, initial sports medicine visit - Currently unclear etiology of the pain that flared 1 week ago and has gradually improved with rest and intermittent NSAIDs.  Pain started before an 8-hour drive, with patient denying shortness of breath, chest pain, leg swelling, so overall very low suspicion for DVT.  No significant changes on x-ray to explain pain.  Patient could be experiencing flare of mild degenerative changes, plica syndrome, or small meniscal pathology  - Start meloxicam  15 mg daily x2 weeks.  If still having pain after 2 weeks, complete 3rd-week of NSAID. May use remaining NSAID as needed once daily for pain control.  Do not to use additional over-the-counter NSAIDs (ibuprofen , naproxen, Advil , Aleve) while taking prescription NSAIDs.  May use Tylenol [phone] mg 2 to 3 times a day for breakthrough pain. - Start HEP for knee - X-ray obtained in clinic.  My interpretation: No acute fracture or dislocation.  Irregular bony appearance of medial proximal tibia.  Will await official radiology review  15 additional minutes spent for educating Therapeutic Home Exercise Program.  This included exercises focusing on stretching, strengthening, with focus on eccentric aspects.   Long term goals include an improvement in range of motion, strength, endurance as well as avoiding reinjury. Patient's frequency would include in 1-2 times a day, 3-5 times a week for a duration of 6-12 weeks. Proper technique shown and discussed handout in great detail with ATC.  All questions were discussed and answered.    Pertinent previous records reviewed include none  Follow Up: As needed if no improvement or worsening of symptoms in 3 to 4 weeks.  Could consider CSI versus ultrasound   Subjective:   I, Moenique Parris, am serving as a  Neurosurgeon for Doctor Ulysees Gander  Chief Complaint: right knee pain   HPI:   02/18/23 Patient is a 58 year old female with right knee pain. Patient states last Tuesday she woke up with right medial knee pain. By Wednesday she was not able to walk. She drove to [place]. Tylenol 800 , Voltaren , and meloxicam  she was still TTP. She was using lidocaine  patches and tiger balm patches those helped some. Pain has decreased some since. Pain radiates to the back of the knee. She states her leg was hot to touch. She does endorse antalgic gait.    Relevant Historical Information: DM type II  Additional pertinent review of systems negative.   Current Outpatient Medications:    amLODipine  (NORVASC ) 5 MG tablet, Take 1 tablet (5 mg total) by mouth daily., Disp: 90 tablet, Rfl: 3   aspirin EC 81 MG tablet, Take 81 mg by mouth daily. Swallow whole., Disp: , Rfl:    busPIRone (BUSPAR) 5 MG tablet, Take 5 mg by mouth daily as needed., Disp: , Rfl:    EPINEPHrine 0.3 mg/0.3 mL IJ SOAJ injection, as needed., Disp: , Rfl:    furosemide (LASIX) 40 MG tablet, Take 20 mg by mouth daily., Disp: , Rfl:    hydrochlorothiazide (HYDRODIURIL) 25 MG tablet, Take 25 mg by mouth daily., Disp: , Rfl:    ibuprofen  (ADVIL ) 800 MG tablet, TAKE ONE TABLET BY MOUTH THREE TIMES DAILY AS NEEDED, Disp: 270 tablet, Rfl: 0   meloxicam  (MOBIC ) 15 MG tablet, Take 1 tablet (15 mg total) by mouth daily., Disp: 30 tablet,  Rfl: 0   naproxen (NAPROSYN) 500 MG tablet, as needed for pain., Disp: , Rfl:    nitroGLYCERIN  (NITROSTAT ) 0.4 MG SL tablet, Place 1 tablet (0.4 mg total) under the tongue every 5 (five) minutes as needed for chest pain., Disp: 25 tablet, Rfl: 3   OZEMPIC, 0.25 OR 0.5 MG/DOSE, 2 MG/3ML SOPN, Inject into the skin once a week. Every friday, Disp: , Rfl:    predniSONE  (DELTASONE ) 20 MG tablet, Take 2 tablets (40 mg total) by mouth daily with breakfast., Disp: 10 tablet, Rfl: 0   rosuvastatin  (CRESTOR ) 10 MG tablet, Take 1  tablet (10 mg total) by mouth daily., Disp: 90 tablet, Rfl: 3   tiZANidine  (ZANAFLEX ) 2 MG tablet, TAKE ONE TABLET BY MOUTH DAILY AT BEDTIME, Disp: 30 tablet, Rfl: 0   Objective:     Vitals:   02/18/23 0904  BP: 120/82  Pulse: 89  SpO2: 97%  Weight: 210 lb (95.3 kg)  Height: 5\' 1"  (1.549 m)      Body mass index is 39.68 kg/m.    Physical Exam:    General:  awake, alert oriented, no acute distress nontoxic Skin: no suspicious lesions or rashes Neuro:sensation intact and strength 5/5 with no deficits, no atrophy, normal muscle tone Psych: No signs of anxiety, depression or other mood disorder  Right knee: Trace swelling No deformity Neg fluid wave, joint milking ROM Flex 110, Ext 0 TTP medial patella, medial joint line, medial femoral condyle NTTP over the quad tendon,  lat fem condyle,   , patella tendon, tibial tuberostiy, fibular head, posterior fossa, pes anserine bursa, gerdy's tubercle,  lateral jt line Neg anterior and posterior drawer Neg lachman Neg sag sign Negative varus stress Negative valgus stress Negative McMurray for palpable pop, though reproduced pain Negative Thessaly  Gait normal    Electronically signed by:  Marshall Skeeter [facility] 9:37 AM 02/18/23

## 2023-02-18 NOTE — Patient Instructions (Signed)
-   Start meloxicam  15 mg daily x2 weeks.  If still having pain after 2 weeks, complete 3rd-week of NSAID. May use remaining NSAID as needed once daily for pain control.  Do not to use additional over-the-counter NSAIDs (ibuprofen , naproxen, Advil , Aleve) while taking prescription NSAIDs.  May use Tylenol [phone] mg 2 to 3 times a day for breakthrough pain. Knee HEP  As needed follow up if no improvement 3-4 week follow up

## 2023-02-24 ENCOUNTER — Other Ambulatory Visit: Payer: Self-pay | Admitting: Sports Medicine

## 2023-02-24 DIAGNOSIS — M25561 Pain in right knee: Secondary | ICD-10-CM

## 2023-02-24 DIAGNOSIS — M899 Disorder of bone, unspecified: Secondary | ICD-10-CM

## 2023-02-24 NOTE — Addendum Note (Signed)
Addended by: Richardean Sale on: 02/24/2023 09:33 AM   Modules accepted: Orders

## 2023-03-05 ENCOUNTER — Encounter: Payer: Self-pay | Admitting: Sports Medicine

## 2023-03-12 NOTE — Progress Notes (Signed)
 [name] [address] [place] [address] Phone: [phone] Subjective:   ISusannah [name], am serving as a scribe for Dr. Arthea Claudene.  I'm seeing this patient by the request  of:  Kip Righter, MD  CC: Low back pain and neck pain follow-up  YEP:[id number]  Hailey [name] is a 58 y.o. female coming in with complaint of back and neck pain. OMT 11/24/2022. Also f/u for L knee pain. Saw Dr. Leonce in January. MRI scheduled in one week. Patient states patient states that because she has been compensating for the knee is having more discomfort in the lower back.  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  Medications patient has been prescribed:   Taking:  Xray L knee 02/18/2023 IMPRESSION: 1. Trace tricompartmental peripheral spurring. 2. Question of a small exophytic lesion arising from the medial proximal metaphysis. This is indeterminate by radiograph, consider MRI for further assessment.       Reviewed prior external information including notes and imaging from previsou exam, outside providers and external EMR if available.   As well as notes that were available from care everywhere and other healthcare systems.  Past medical history, social, surgical and family history all reviewed in electronic medical record.  No pertanent information unless stated regarding to the chief complaint.   Past Medical History:  Diagnosis Date   Angina at rest [facility])    Anxiety    Chest pain    Diabetes mellitus without complication (HCC)    High cholesterol    High cholesterol    History of angina    History of TIA (transient ischemic attack) 10/11/2020   Hypercholesterolemia 10/11/2020   Hyperlipidemia    Migraine    Personal history of COVID-19 10/11/2020   Radiculopathy of cervical region    TIA (transient ischemic attack)    Transient cerebral ischemic attack    Type II diabetes mellitus (HCC) 10/11/2020    Allergies  Allergen  Reactions   Hazelnut (Filbert) Anaphylaxis   Penicillins Anaphylaxis   Coconut (Cocos Nucifera)    Cocos Nucifera      Review of Systems:  No headache, visual changes, nausea, vomiting, diarrhea, constipation, dizziness, abdominal pain, skin rash, fevers, chills, night sweats, weight loss, swollen lymph nodes, body aches, joint swelling, chest pain, shortness of breath, mood changes. POSITIVE muscle aches  Objective  Blood pressure 122/80, pulse 90, height 5' 1 (1.549 m), weight 206 lb (93.4 kg), SpO2 97%.   General: No apparent distress alert and oriented x3 mood and affect normal, dressed appropriately.  HEENT: Pupils equal, extraocular movements intact  Respiratory: Patient's speak in full sentences and does not appear short of breath  Cardiovascular: No lower extremity edema, non tender, no erythema  Gait MSK:  Back does have some loss lordosis noted.  Some poor core strength noted.  Some tightness with certain movements.  Patient's neck exam does have some limited sidebending bilaterally.  Seems tighter on the right greater than the left.  Osteopathic findings  C2 flexed rotated and side bent right C5 flexed rotated and side bent left T3 extended rotated and side bent right inhaled rib T5 extended rotated and side bent left L5 flexed rotated and side bent right Sacrum right on right       Assessment and Plan:  Degenerative disc disease, lumbar Known degenerative disc disease.  Continue to stay active otherwise.  Discussed icing regimen.  Likely exacerbation secondary to patient's knee and  being a little less active because of it.  Encouraged her to do more the exercises.  Continue the same medications.  Follow-up again in 4 to 8 weeks.  Degenerative disc disease, cervical Stable, continue to monitor.  Does respond well to osteopathic manipulation.  Discussed proper lifting mechanics    Nonallopathic problems  Decision today to treat with OMT was based on Physical  Exam  After verbal consent patient was treated with HVLA, ME, FPR techniques in cervical, rib, thoracic, lumbar, and sacral  areas  Patient tolerated the procedure well with improvement in symptoms  Patient given exercises, stretches and lifestyle modifications  See medications in patient instructions if given  Patient will follow up in 4-8 weeks     The above documentation has been reviewed and is accurate and complete Rashika Bettes M Shayra Anton, DO         Note: This dictation was prepared with Dragon dictation along with smaller phrase technology. Any transcriptional errors that result from this process are unintentional.

## 2023-03-13 ENCOUNTER — Encounter: Payer: Self-pay | Admitting: Family Medicine

## 2023-03-13 ENCOUNTER — Ambulatory Visit: Payer: 59 | Admitting: Family Medicine

## 2023-03-13 VITALS — BP 122/80 | HR 90 | Ht 61.0 in | Wt 206.0 lb

## 2023-03-13 DIAGNOSIS — M9908 Segmental and somatic dysfunction of rib cage: Secondary | ICD-10-CM | POA: Diagnosis not present

## 2023-03-13 DIAGNOSIS — M5030 Other cervical disc degeneration, unspecified cervical region: Secondary | ICD-10-CM | POA: Diagnosis not present

## 2023-03-13 DIAGNOSIS — M51362 Other intervertebral disc degeneration, lumbar region with discogenic back pain and lower extremity pain: Secondary | ICD-10-CM

## 2023-03-13 DIAGNOSIS — M9902 Segmental and somatic dysfunction of thoracic region: Secondary | ICD-10-CM | POA: Diagnosis not present

## 2023-03-13 DIAGNOSIS — M9904 Segmental and somatic dysfunction of sacral region: Secondary | ICD-10-CM

## 2023-03-13 DIAGNOSIS — M9901 Segmental and somatic dysfunction of cervical region: Secondary | ICD-10-CM | POA: Diagnosis not present

## 2023-03-13 DIAGNOSIS — M9903 Segmental and somatic dysfunction of lumbar region: Secondary | ICD-10-CM

## 2023-03-13 NOTE — Assessment & Plan Note (Signed)
 Stable, continue to monitor.  Does respond well to osteopathic manipulation.  Discussed proper lifting mechanics

## 2023-03-13 NOTE — Assessment & Plan Note (Signed)
 Known degenerative disc disease.  Continue to stay active otherwise.  Discussed icing regimen.  Likely exacerbation secondary to patient's knee and being a little less active because of it.  Encouraged her to do more the exercises.  Continue the same medications.  Follow-up again in 4 to 8 weeks.

## 2023-03-13 NOTE — Patient Instructions (Signed)
 Good to see you! Have fun with [place] luck with the TV See you again in 5-6 weeks

## 2023-03-16 ENCOUNTER — Other Ambulatory Visit: Payer: Self-pay | Admitting: Sports Medicine

## 2023-03-19 ENCOUNTER — Ambulatory Visit
Admission: RE | Admit: 2023-03-19 | Discharge: 2023-03-19 | Disposition: A | Discharge status: Home / Self Care | Payer: 59 | Source: Ambulatory Visit | Attending: Sports Medicine | Admitting: Sports Medicine

## 2023-03-19 DIAGNOSIS — M899 Disorder of bone, unspecified: Secondary | ICD-10-CM

## 2023-03-19 DIAGNOSIS — M25561 Pain in right knee: Secondary | ICD-10-CM

## 2023-03-19 MED ORDER — GADOPICLENOL 0.5 MMOL/ML IV SOLN
10.0000 mL | Freq: Once | INTRAVENOUS | Status: AC | PRN
  Administered 2023-03-19: 10 mL via INTRAVENOUS

## 2023-03-27 ENCOUNTER — Encounter: Payer: Self-pay | Admitting: Sports Medicine

## 2023-03-27 NOTE — Telephone Encounter (Signed)
Appointment scheduled for Wednesday ° °

## 2023-04-01 ENCOUNTER — Ambulatory Visit: Payer: 59 | Admitting: Sports Medicine

## 2023-04-01 NOTE — Progress Notes (Unsigned)
    Aleen Sells [facility] [address] [place] [address] Phone: [phone]   Assessment and Plan:     There are no diagnoses linked to this encounter.  ***   Pertinent previous records reviewed include ***    Follow Up: ***     Subjective:   I, Hailey [name], am serving as a Neurosurgeon for Doctor Richardean Sale   Chief Complaint: right knee pain    HPI:    02/18/23 Patient is a 58 year old female with right knee pain. Patient states last Tuesday she woke up with right medial knee pain. By Wednesday she was not able to walk. She drove to [place]. Tylenol 800 , Voltaren , and meloxicam she was still TTP. She was using lidocaine patches and tiger balm patches those helped some. Pain has decreased some since. Pain radiates to the back of the knee. She states her leg was hot to touch. She does endorse antalgic gait.    04/02/2023 Patient states   Relevant Historical Information: DM type II  Additional pertinent review of systems negative.   Current Outpatient Medications:    amLODipine (NORVASC) 5 MG tablet, Take 1 tablet (5 mg total) by mouth daily., Disp: 90 tablet, Rfl: 3   aspirin EC 81 MG tablet, Take 81 mg by mouth daily. Swallow whole., Disp: , Rfl:    busPIRone (BUSPAR) 5 MG tablet, Take 5 mg by mouth daily as needed., Disp: , Rfl:    EPINEPHrine 0.3 mg/0.3 mL IJ SOAJ injection, as needed., Disp: , Rfl:    furosemide (LASIX) 40 MG tablet, Take 20 mg by mouth daily., Disp: , Rfl:    hydrochlorothiazide (HYDRODIURIL) 25 MG tablet, Take 25 mg by mouth daily., Disp: , Rfl:    ibuprofen (ADVIL) 800 MG tablet, TAKE ONE TABLET BY MOUTH THREE TIMES DAILY AS NEEDED, Disp: 270 tablet, Rfl: 0   meloxicam (MOBIC) 15 MG tablet, Take 1 tablet (15 mg total) by mouth daily., Disp: 30 tablet, Rfl: 0   naproxen (NAPROSYN) 500 MG tablet, as needed for pain., Disp: , Rfl:    nitroGLYCERIN (NITROSTAT) 0.4 MG SL tablet, Place 1 tablet (0.4 mg total)  under the tongue every 5 (five) minutes as needed for chest pain., Disp: 25 tablet, Rfl: 3   OZEMPIC, 0.25 OR 0.5 MG/DOSE, 2 MG/3ML SOPN, Inject into the skin once a week. Every friday, Disp: , Rfl:    predniSONE (DELTASONE) 20 MG tablet, Take 2 tablets (40 mg total) by mouth daily with breakfast., Disp: 10 tablet, Rfl: 0   rosuvastatin (CRESTOR) 10 MG tablet, Take 1 tablet (10 mg total) by mouth daily., Disp: 90 tablet, Rfl: 3   tiZANidine (ZANAFLEX) 2 MG tablet, TAKE ONE TABLET BY MOUTH DAILY AT BEDTIME, Disp: 30 tablet, Rfl: 0   Objective:     There were no vitals filed for this visit.    There is no height or weight on file to calculate BMI.    Physical Exam:    ***   Electronically signed by:  Aleen Sells D[facility] 7:33 AM 04/01/23

## 2023-04-02 ENCOUNTER — Ambulatory Visit: Payer: 59 | Admitting: Sports Medicine

## 2023-04-02 VITALS — HR 82 | Ht 61.0 in | Wt 207.0 lb

## 2023-04-02 DIAGNOSIS — M1711 Unilateral primary osteoarthritis, right knee: Secondary | ICD-10-CM

## 2023-04-02 DIAGNOSIS — M25561 Pain in right knee: Secondary | ICD-10-CM | POA: Diagnosis not present

## 2023-04-02 MED ORDER — LIDOCAINE 5 % EX PTCH: 1.0000 | MEDICATED_PATCH | CUTANEOUS | 1 refills | Status: DC

## 2023-04-02 NOTE — Patient Instructions (Addendum)
 Lidocaine patches  Topical Voltaren gel over areas of pain  Tylenol as needed  As needed follow up if you would like an injection

## 2023-04-14 NOTE — Progress Notes (Unsigned)
 [name] [address] [place] [address] Phone: [phone] Subjective:   Hailey [name], am serving as a scribe for Dr. Antoine Primas.  I'm seeing this patient by the request  of:  Farris Has, MD  CC: back and neck pain follow up   [id number]  Hailey [name] is a 58 y.o. female coming in with complaint of back and neck pain. OMT on 03/13/2023. Patient states that her neck is painful today.   Pain in R knee is worse due to increase in work activity.   Medications patient has been prescribed:   Taking:     MRI of the knee  IMPRESSION: 1. No meniscal or ligamentous injury of the right knee. 2. Moderate hydronephrosis cartilage loss of the weight-bearing medial femorotibial compartment. 3. A 1.8 x 2.5 x 0.5 cm ganglion cyst along the medial margin of the proximal tibia.   Reviewed prior external information including notes and imaging from previsou exam, outside providers and external EMR if available.   As well as notes that were available from care everywhere and other healthcare systems.  Past medical history, social, surgical and family history all reviewed in electronic medical record.  No pertanent information unless stated regarding to the chief complaint.   Past Medical History:  Diagnosis Date   Angina at rest [facility])    Anxiety    Chest pain    Diabetes mellitus without complication (HCC)    High cholesterol    High cholesterol    History of angina    History of TIA (transient ischemic attack) 10/11/2020   Hypercholesterolemia 10/11/2020   Hyperlipidemia    Migraine    Personal history of COVID-19 10/11/2020   Radiculopathy of cervical region    TIA (transient ischemic attack)    Transient cerebral ischemic attack    Type II diabetes mellitus (HCC) 10/11/2020    Allergies  Allergen Reactions   Hazelnut (Filbert) Anaphylaxis   Penicillins Anaphylaxis   Coconut (Cocos Nucifera)    Cocos Nucifera       Review of Systems:  No headache, visual changes, nausea, vomiting, diarrhea, constipation, dizziness, abdominal pain, skin rash, fevers, chills, night sweats, weight loss, swollen lymph nodes, body aches, joint swelling, chest pain, shortness of breath, mood changes. POSITIVE muscle aches  Objective  There were no vitals taken for this visit.   General: No apparent distress alert and oriented x3 mood and affect normal, dressed appropriately.  HEENT: Pupils equal, extraocular movements intact  Respiratory: Patient's speak in full sentences and does not appear short of breath  Cardiovascular: No lower extremity edema, non tender, no erythema  Gait MSK:  Back   Osteopathic findings  C2 flexed rotated and side bent right C6 flexed rotated and side bent left T3 extended rotated and side bent right inhaled rib T9 extended rotated and side bent left L2 flexed rotated and side bent right Sacrum right on right     Assessment and Plan:  No problem-specific Assessment & Plan notes found for this encounter.    Nonallopathic problems  Decision today to treat with OMT was based on Physical Exam  After verbal consent patient was treated with HVLA, ME, FPR techniques in cervical, rib, thoracic, lumbar, and sacral  areas  Patient tolerated the procedure well with improvement in symptoms  Patient given exercises, stretches and lifestyle modifications  See medications in patient instructions if given  Patient will follow up in 4-8 weeks    The above documentation  has been reviewed and is accurate and complete Judi Saa, DO          Note: This dictation was prepared with Dragon dictation along with smaller phrase technology. Any transcriptional errors that result from this process are unintentional.

## 2023-04-15 ENCOUNTER — Ambulatory Visit: Payer: 59 | Admitting: Family Medicine

## 2023-04-15 VITALS — BP 120/74 | HR 86 | Ht 61.0 in | Wt 207.0 lb

## 2023-04-15 DIAGNOSIS — M9901 Segmental and somatic dysfunction of cervical region: Secondary | ICD-10-CM | POA: Diagnosis not present

## 2023-04-15 DIAGNOSIS — M9904 Segmental and somatic dysfunction of sacral region: Secondary | ICD-10-CM | POA: Diagnosis not present

## 2023-04-15 DIAGNOSIS — M9902 Segmental and somatic dysfunction of thoracic region: Secondary | ICD-10-CM

## 2023-04-15 DIAGNOSIS — M9903 Segmental and somatic dysfunction of lumbar region: Secondary | ICD-10-CM | POA: Diagnosis not present

## 2023-04-15 DIAGNOSIS — M51362 Other intervertebral disc degeneration, lumbar region with discogenic back pain and lower extremity pain: Secondary | ICD-10-CM | POA: Diagnosis not present

## 2023-04-15 DIAGNOSIS — M9908 Segmental and somatic dysfunction of rib cage: Secondary | ICD-10-CM | POA: Diagnosis not present

## 2023-04-15 NOTE — Patient Instructions (Signed)
 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

## 2023-04-16 ENCOUNTER — Encounter: Payer: Self-pay | Admitting: Family Medicine

## 2023-04-16 NOTE — Assessment & Plan Note (Signed)
 Significant tightness noted.  Discussed icing regimen and home exercises, which activities to do and which ones to avoid.  Increase activity slowly.  Follow-up again in 6 to 8 weeks.  No medication changes today.

## 2023-06-17 NOTE — Progress Notes (Deleted)
  [facility] [address] [place] [address] Phone: [phone] Subjective:    I'm seeing this patient by the request  of:  Ronna Coho, MD  CC:   [id number]  Hailey [name] is a 57 y.o. female coming in with complaint of back and neck pain.OMT on 04/15/2023. Patient states   Medications patient has been prescribed:   Taking:         Reviewed prior external information including notes and imaging from previsou exam, outside providers and external EMR if available.   As well as notes that were available from care everywhere and other healthcare systems.  Past medical history, social, surgical and family history all reviewed in electronic medical record.  No pertanent information unless stated regarding to the chief complaint.   Past Medical History:  Diagnosis Date   Angina at rest [facility])    Anxiety    Chest pain    Diabetes mellitus without complication (HCC)    High cholesterol    High cholesterol    History of angina    History of TIA (transient ischemic attack) 10/11/2020   Hypercholesterolemia 10/11/2020   Hyperlipidemia    Migraine    Personal history of COVID-19 10/11/2020   Radiculopathy of cervical region    TIA (transient ischemic attack)    Transient cerebral ischemic attack    Type II diabetes mellitus (HCC) 10/11/2020    Allergies  Allergen Reactions   Hazelnut (Filbert) Anaphylaxis   Penicillins Anaphylaxis   Coconut (Cocos Nucifera)    Cocos Nucifera      Review of Systems:  No headache, visual changes, nausea, vomiting, diarrhea, constipation, dizziness, abdominal pain, skin rash, fevers, chills, night sweats, weight loss, swollen lymph nodes, body aches, joint swelling, chest pain, shortness of breath, mood changes. POSITIVE muscle aches  Objective  There were no vitals taken for this visit.   General: No apparent distress alert and oriented x3 mood and affect normal, dressed appropriately.  HEENT:  Pupils equal, extraocular movements intact  Respiratory: Patient's speak in full sentences and does not appear short of breath  Cardiovascular: No lower extremity edema, non tender, no erythema  Gait MSK:  Back   Osteopathic findings  C2 flexed rotated and side bent right C6 flexed rotated and side bent left T3 extended rotated and side bent right inhaled rib T9 extended rotated and side bent left L2 flexed rotated and side bent right Sacrum right on right       Assessment and Plan:  No problem-specific Assessment & Plan notes found for this encounter.    Nonallopathic problems  Decision today to treat with OMT was based on Physical Exam  After verbal consent patient was treated with HVLA, ME, FPR techniques in cervical, rib, thoracic, lumbar, and sacral  areas  Patient tolerated the procedure well with improvement in symptoms  Patient given exercises, stretches and lifestyle modifications  See medications in patient instructions if given  Patient will follow up in 4-8 weeks             Note: This dictation was prepared with Dragon dictation along with smaller phrase technology. Any transcriptional errors that result from this process are unintentional.

## 2023-06-18 ENCOUNTER — Ambulatory Visit: Admitting: Family Medicine

## 2023-07-28 ENCOUNTER — Telehealth: Payer: Self-pay | Admitting: Internal Medicine

## 2023-07-28 NOTE — Telephone Encounter (Signed)
 Pt called c/o lower back pain and numbness in her leg and sometimes her right arm... I asked her to talk with her PCP... she has h/o intervertebral disc of the lumbar... she agrees and will call her PCP.

## 2023-07-28 NOTE — Telephone Encounter (Signed)
 Patient called stating is having constant lower back pain and numbing in her right arm.  No other symptoms.

## 2023-07-29 NOTE — Progress Notes (Signed)
 [name] [address] [place] [address] Phone: [phone] Subjective:   [name], am serving as a scribe for Dr. Arthea Claudene.  I'm seeing this patient by the request  of:  Kip Righter, MD  CC: Back and neck pain follow-up  YEP:Dlagzrupcz  Hailey [name] is a 58 y.o. female coming in with complaint of back and neck pain. OMT 04/15/2023. Patient states overall doing relatively well has been doing some more physical activity getting ready for husband birthday party.  Medications patient has been prescribed: None  Taking:         Reviewed prior external information including notes and imaging from previsou exam, outside providers and external EMR if available.   As well as notes that were available from care everywhere and other healthcare systems.  Past medical history, social, surgical and family history all reviewed in electronic medical record.  No pertanent information unless stated regarding to the chief complaint.   Past Medical History:  Diagnosis Date   Angina at rest [facility])    Anxiety    Chest pain    Diabetes mellitus without complication (HCC)    High cholesterol    High cholesterol    History of angina    History of TIA (transient ischemic attack) 10/11/2020   Hypercholesterolemia 10/11/2020   Hyperlipidemia    Migraine    Personal history of COVID-19 10/11/2020   Radiculopathy of cervical region    TIA (transient ischemic attack)    Transient cerebral ischemic attack    Type II diabetes mellitus (HCC) 10/11/2020    Allergies  Allergen Reactions   Hazelnut (Filbert) Anaphylaxis   Penicillins Anaphylaxis   Coconut (Cocos Nucifera)    Cocos Nucifera      Review of Systems:  No headache, visual changes, nausea, vomiting, diarrhea, constipation, dizziness, abdominal pain, skin rash, fevers, chills, night sweats, weight loss, swollen lymph nodes, body aches, joint swelling, chest pain, shortness of  breath, mood changes. POSITIVE muscle aches  Objective  Blood pressure 110/78, height 5' 1 (1.549 m), weight 209 lb (94.8 kg).   General: No apparent distress alert and oriented x3 mood and affect normal, dressed appropriately.  HEENT: Pupils equal, extraocular movements intact  Respiratory: Patient's speak in full sentences and does not appear short of breath  Cardiovascular: No lower extremity edema, non tender, no erythema  Gait MSK:  Back does have some loss lordosis noted.  Some tenderness to palpation noted to the paraspinal musculature.  Tightness noted in the parascapular area more than other area.  Osteopathic findings  C3 flexed rotated and side bent right C6 flexed rotated and side bent left T3 extended rotated and side bent right inhaled rib T8 extended rotated and side bent left L1 flexed rotated and side bent right Sacrum right on right       Assessment and Plan:  Degenerative disc disease, lumbar Patient has been more active recently.  Likely contributing to some of the discomfort and pain.  Discussed home exercises and icing regimen again.  Increase activity slowly.  Continue to work on core strengthening.  Patient is doing relatively well with her weight and will continue to monitor.  Follow-up again in 6 to 8 weeks otherwise.    Nonallopathic problems  Decision today to treat with OMT was based on Physical Exam  After verbal consent patient was treated with HVLA, ME, FPR techniques in cervical, rib, thoracic, lumbar, and sacral  areas  Patient tolerated the procedure  well with improvement in symptoms  Patient given exercises, stretches and lifestyle modifications  See medications in patient instructions if given  Patient will follow up in 4-8 weeks     The above documentation has been reviewed and is accurate and complete [name] M Crickett Abbett, DO         Note: This dictation was prepared with Dragon dictation along with smaller phrase technology.  Any transcriptional errors that result from this process are unintentional.

## 2023-07-31 ENCOUNTER — Ambulatory Visit: Admitting: Family Medicine

## 2023-07-31 ENCOUNTER — Encounter: Payer: Self-pay | Admitting: Family Medicine

## 2023-07-31 VITALS — BP 110/78 | Ht 61.0 in | Wt 209.0 lb

## 2023-07-31 DIAGNOSIS — M9904 Segmental and somatic dysfunction of sacral region: Secondary | ICD-10-CM

## 2023-07-31 DIAGNOSIS — M9902 Segmental and somatic dysfunction of thoracic region: Secondary | ICD-10-CM

## 2023-07-31 DIAGNOSIS — M51362 Other intervertebral disc degeneration, lumbar region with discogenic back pain and lower extremity pain: Secondary | ICD-10-CM

## 2023-07-31 DIAGNOSIS — M9901 Segmental and somatic dysfunction of cervical region: Secondary | ICD-10-CM | POA: Diagnosis not present

## 2023-07-31 DIAGNOSIS — M9908 Segmental and somatic dysfunction of rib cage: Secondary | ICD-10-CM | POA: Diagnosis not present

## 2023-07-31 DIAGNOSIS — M9903 Segmental and somatic dysfunction of lumbar region: Secondary | ICD-10-CM

## 2023-07-31 NOTE — Assessment & Plan Note (Signed)
 Patient has been more active recently.  Likely contributing to some of the discomfort and pain.  Discussed home exercises and icing regimen again.  Increase activity slowly.  Continue to work on core strengthening.  Patient is doing relatively well with her weight and will continue to monitor.  Follow-up again in 6 to 8 weeks otherwise.

## 2023-09-10 ENCOUNTER — Other Ambulatory Visit: Payer: Self-pay | Admitting: Family Medicine

## 2023-09-10 DIAGNOSIS — Z1231 Encounter for screening mammogram for malignant neoplasm of breast: Secondary | ICD-10-CM

## 2023-09-28 NOTE — Progress Notes (Unsigned)
 [name] [address] [place] [address] Phone: [phone] Subjective:   Hailey [name], am serving as a scribe for Dr. Arthea Claudene.  I'm seeing this patient by the request  of:  Kip Righter, MD  CC: Back and neck pain follow-up  YEP:Dlagzrupcz  Hailey [name] is a 58 y.o. female coming in with complaint of back and neck pain. OMT On 07/31/2023. Patient states that she is doing well. Same as last visit.   Medications patient has been prescribed:   Taking:         Reviewed prior external information including notes and imaging from previsou exam, outside providers and external EMR if available.   As well as notes that were available from care everywhere and other healthcare systems.  Past medical history, social, surgical and family history all reviewed in electronic medical record.  No pertanent information unless stated regarding to the chief complaint.   Past Medical History:  Diagnosis Date   Angina at rest [facility]    Anxiety    Chest pain    Diabetes mellitus without complication (HCC)    High cholesterol    High cholesterol    History of angina    History of TIA (transient ischemic attack) 10/11/2020   Hypercholesterolemia 10/11/2020   Hyperlipidemia    Migraine    Personal history of COVID-19 10/11/2020   Radiculopathy of cervical region    TIA (transient ischemic attack)    Transient cerebral ischemic attack    Type II diabetes mellitus (HCC) 10/11/2020    Allergies  Allergen Reactions   Hazelnut (Filbert) Anaphylaxis   Penicillins Anaphylaxis   Coconut (Cocos Nucifera)    Cocos Nucifera      Review of Systems:  No headache, visual changes, nausea, vomiting, diarrhea, constipation, dizziness, abdominal pain, skin rash, fevers, chills, night sweats, weight loss, swollen lymph nodes, body aches, joint swelling, chest pain, shortness of breath, mood changes. POSITIVE muscle aches  Objective  Blood pressure  112/74, height 5' 1 (1.549 m), weight 200 lb (90.7 kg).   General: No apparent distress alert and oriented x3 mood and affect normal, dressed appropriately.  HEENT: Pupils equal, extraocular movements intact  Respiratory: Patient's speak in full sentences and does not appear short of breath  Cardiovascular: No lower extremity edema, non tender, no erythema  Gait MSK:  Back does have some mild loss of lordosis noted.  Some tenderness to palpation in the paraspinal musculature.  Still poor core strength noted.  Osteopathic findings  C3 flexed rotated sidebent left C6 flexed rotated and side bent left T3 extended rotated and side bent right inhaled rib T8 extended rotated and side bent left L1 flexed rotated and side bent right L3 flexed rotated and side bent left Sacrum right on right    Assessment and Plan:  Degenerative disc disease, lumbar arthritic changes.  Discussed icing regimen and home exercises, discussed which activities to do and which ones to avoid.  Discussed the Zanaflex , discussed naproxen and meloxicam .  Follow-up again in 6 to 8 weeks      Nonallopathic problems  Decision today to treat with OMT was based on Physical Exam  After verbal consent patient was treated with HVLA, ME, FPR techniques in cervical, rib, thoracic, lumbar, and sacral  areas  Patient tolerated the procedure well with improvement in symptoms  Patient given exercises, stretches and lifestyle modifications  See medications in patient instructions if given  Patient will follow up in 4-8 weeks  The above documentation has been reviewed and is accurate and complete Hailey Christine M Lilliahna Schubring, DO         Note: This dictation was prepared with Dragon dictation along with smaller phrase technology. Any transcriptional errors that result from this process are unintentional.

## 2023-10-01 ENCOUNTER — Ambulatory Visit: Admitting: Family Medicine

## 2023-10-01 ENCOUNTER — Encounter: Payer: Self-pay | Admitting: Family Medicine

## 2023-10-01 VITALS — BP 112/74 | Ht 61.0 in | Wt 200.0 lb

## 2023-10-01 DIAGNOSIS — M9903 Segmental and somatic dysfunction of lumbar region: Secondary | ICD-10-CM

## 2023-10-01 DIAGNOSIS — M9904 Segmental and somatic dysfunction of sacral region: Secondary | ICD-10-CM | POA: Diagnosis not present

## 2023-10-01 DIAGNOSIS — M9908 Segmental and somatic dysfunction of rib cage: Secondary | ICD-10-CM | POA: Diagnosis not present

## 2023-10-01 DIAGNOSIS — M9901 Segmental and somatic dysfunction of cervical region: Secondary | ICD-10-CM

## 2023-10-01 DIAGNOSIS — M51362 Other intervertebral disc degeneration, lumbar region with discogenic back pain and lower extremity pain: Secondary | ICD-10-CM

## 2023-10-01 DIAGNOSIS — M9902 Segmental and somatic dysfunction of thoracic region: Secondary | ICD-10-CM

## 2023-10-01 NOTE — Patient Instructions (Signed)
 Good to see you Hailey [name] was great See me after your trip in [place]

## 2023-10-01 NOTE — Assessment & Plan Note (Signed)
 arthritic changes.  Discussed icing regimen and home exercises, discussed which activities to do and which ones to avoid.  Discussed the Zanaflex , discussed naproxen and meloxicam .  Follow-up again in 6 to 8 weeks

## 2023-10-14 ENCOUNTER — Other Ambulatory Visit: Payer: Self-pay | Admitting: Family Medicine

## 2023-10-14 ENCOUNTER — Ambulatory Visit
Admission: RE | Admit: 2023-10-14 | Discharge: 2023-10-14 | Disposition: A | Discharge status: Home / Self Care | Source: Ambulatory Visit | Attending: Family Medicine | Admitting: Family Medicine

## 2023-10-14 DIAGNOSIS — R1032 Left lower quadrant pain: Secondary | ICD-10-CM

## 2023-10-14 MED ORDER — IOPAMIDOL (ISOVUE-370) INJECTION 76%
75.0000 mL | Freq: Once | INTRAVENOUS | Status: AC | PRN
  Administered 2023-10-14: 75 mL via INTRAVENOUS

## 2023-10-21 ENCOUNTER — Other Ambulatory Visit: Payer: Self-pay | Admitting: Sports Medicine

## 2023-10-30 ENCOUNTER — Ambulatory Visit
Admission: RE | Admit: 2023-10-30 | Discharge: 2023-10-30 | Disposition: A | Discharge status: Home / Self Care | Source: Ambulatory Visit | Attending: Family Medicine | Admitting: Family Medicine

## 2023-10-30 DIAGNOSIS — Z1231 Encounter for screening mammogram for malignant neoplasm of breast: Secondary | ICD-10-CM

## 2023-11-05 ENCOUNTER — Ambulatory Visit: Admitting: Internal Medicine

## 2023-11-26 NOTE — Progress Notes (Deleted)
  [name] [address] [place] [address] Phone: [phone] Subjective:    I'm seeing this patient by the request  of:  Kip Righter, MD  CC: Neck and back pain follow-up  YEP:Dlagzrupcz  [name] is a 58 y.o. female coming in with complaint of back and neck pain. OMT on 10/01/2023. Patient states   Medications patient has been prescribed:   Taking:         Reviewed prior external information including notes and imaging from previsou exam, outside providers and external EMR if available.   As well as notes that were available from care everywhere and other healthcare systems.  Past medical history, social, surgical and family history all reviewed in electronic medical record.  No pertanent information unless stated regarding to the chief complaint.   Past Medical History:  Diagnosis Date   Angina at rest    Anxiety    Chest pain    Diabetes mellitus without complication (HCC)    High cholesterol    High cholesterol    History of angina    History of TIA (transient ischemic attack) 10/11/2020   Hypercholesterolemia 10/11/2020   Hyperlipidemia    Migraine    Personal history of COVID-19 10/11/2020   Radiculopathy of cervical region    TIA (transient ischemic attack)    Transient cerebral ischemic attack    Type II diabetes mellitus (HCC) 10/11/2020    Allergies  Allergen Reactions   Hazelnut (Filbert) Anaphylaxis   Penicillins Anaphylaxis   Coconut (Cocos Nucifera)    Cocos Nucifera      Review of Systems:  No headache, visual changes, nausea, vomiting, diarrhea, constipation, dizziness, abdominal pain, skin rash, fevers, chills, night sweats, weight loss, swollen lymph nodes, body aches, joint swelling, chest pain, shortness of breath, mood changes. POSITIVE muscle aches  Objective  There were no vitals taken for this visit.   General: No apparent distress alert and oriented x3 mood and affect normal, dressed  appropriately.  HEENT: Pupils equal, extraocular movements intact  Respiratory: Patient's speak in full sentences and does not appear short of breath  Cardiovascular: No lower extremity edema, non tender, no erythema  Gait MSK:  Back does have some loss of lordosis noted.  Poor core strength noted.  Osteopathic findings  C2 flexed rotated and side bent right C6 flexed rotated and side bent left T3 extended rotated and side bent right inhaled rib T9 extended rotated and side bent left L2 flexed rotated and side bent right Sacrum right on right       Assessment and Plan:  No problem-specific Assessment & Plan notes found for this encounter.    Nonallopathic problems  Decision today to treat with OMT was based on Physical Exam  After verbal consent patient was treated with HVLA, ME, FPR techniques in cervical, rib, thoracic, lumbar, and sacral  areas  Patient tolerated the procedure well with improvement in symptoms  Patient given exercises, stretches and lifestyle modifications  See medications in patient instructions if given  Patient will follow up in 4-8 weeks     The above documentation has been reviewed and is accurate and complete Caetano Oberhaus M Arlett Goold, DO         Note: This dictation was prepared with Dragon dictation along with smaller phrase technology. Any transcriptional errors that result from this process are unintentional.

## 2023-12-02 ENCOUNTER — Ambulatory Visit: Admitting: Family Medicine

## 2023-12-07 ENCOUNTER — Encounter: Payer: Self-pay | Admitting: Family Medicine

## 2024-01-08 ENCOUNTER — Ambulatory Visit: Admitting: Internal Medicine

## 2024-01-21 NOTE — Progress Notes (Unsigned)
 Cardiology Office Note   Date:  01/21/2024   ID:  Hailey [name], DOB 08-02-1965, MRN [id number]  PCP:  Kip Righter, MD  Cardiologist:   Vina Gull, MD    Presents for continued cardiac care.  To establish as a new patient.   History of Present Illness: Hailey [name] is a 58 y.o. female with a history of chest tightness.  She previously lived in  [place]  IN 2019 had stress echo that was positive.   Went on to have cath that showed  luminal irregularities in the LAD and a myocardial bridge.n 2019 she had chest tightness with right arm pain tingling.  LVEF was normal.  She was placed on medical therapy of amlodipine  and metoprolol.  She relieved and noted no change in her symptoms. I sw her in Nov 2022  The pt says she just got back from [facility]   Very stressfult time    Caring for father in law who just died.    Had a couple episodes of of tightness in chest  that went to arm    Eased on own  She attrib to mental stresss ( funeral)    No chest tightness with activity   Breathing has been OK   Doing PT in pool   Helps     Stopped statin with achiness in legs   Has not restarted.    I saw the pt in 2023  No outpatient medications have been marked as taking for the 01/22/24 encounter (Appointment) with Gull Vina GAILS, MD.     Allergies:   Hazelnut (filbert), Penicillins, Coconut (cocos nucifera), and Cocos nucifera   Past Medical History:  Diagnosis Date   Angina at rest    Anxiety    Chest pain    Diabetes mellitus without complication (HCC)    High cholesterol    High cholesterol    History of angina    History of TIA (transient ischemic attack) 10/11/2020   Hypercholesterolemia 10/11/2020   Hyperlipidemia    Migraine    Personal history of COVID-19 10/11/2020   Radiculopathy of cervical region    TIA (transient ischemic attack)    Transient cerebral ischemic attack    Type II diabetes mellitus (HCC) 10/11/2020    Social History:  The patient  reports that she has never  smoked. She has never used smokeless tobacco. She reports that she does not drink alcohol and does not use drugs.   Family History:  The patient's family history includes Breast cancer in her maternal aunt and paternal aunt.    ROS:  Please see the history of present illness. All other systems are reviewed and  Negative to the above problem except as noted.    PHYSICAL EXAM: VS:  There were no vitals taken for this visit.  GEN: Morbidly obese in no acute distress  HEENT: normal  Neck: no JVD, no carotid bruits, Cardiac: RRR; no murmurs  No LE edema  Respiratory:  clear to auscultation bilaterally, GI: soft, nontender, nondistended, + BS  No hepatomegaly  MS: no deformity Moving all extremities   Skin: warm and dry, no rash Neuro:  Strength and sensation are intact Psych: euthymic mood, full affect   EKG:  EKG is not ordered today.   Lipid Panel No results found for: CHOL, TRIG, HDL, CHOLHDL, VLDL, LDLCALC, LDLDIRECT    Wt Readings from Last 3 Encounters:  10/01/23 200 lb (90.7 kg)  07/31/23 209 lb (94.8 kg)  04/15/23 207 lb (93.9  kg)      ASSESSMENT AND PLAN:  1. Chest pain.   Pt has had a couple episodes of discomfort     None with activity   Keep on amlodipine      Give Rx for NTG to take as needed     2  Lipids    Will get lipids from Dr Staci office    need to optimize     3  HTN  BP controlled     4  DM   Continue oral agents    Will review labs      Stay active    Follow up in 6 months   Signed, Vina Gull, MD  01/21/2024 10:22 AM    [facility] [address] [place], [place], [place]  [address] Phone: [phone]; Fax: [phone]

## 2024-01-22 ENCOUNTER — Ambulatory Visit: Attending: Internal Medicine | Admitting: Internal Medicine

## 2024-01-22 ENCOUNTER — Encounter: Payer: Self-pay | Admitting: Internal Medicine

## 2024-01-22 VITALS — BP 126/82 | HR 76 | Ht 61.0 in | Wt 203.0 lb

## 2024-01-22 DIAGNOSIS — R072 Precordial pain: Secondary | ICD-10-CM | POA: Diagnosis not present

## 2024-01-22 DIAGNOSIS — R21 Rash and other nonspecific skin eruption: Secondary | ICD-10-CM

## 2024-01-22 NOTE — Patient Instructions (Signed)
" °  Follow-Up: At [facility], you and your health needs are our priority.  As part of our continuing mission to provide you with exceptional heart care, our providers are all part of one team.  This team includes your primary Cardiologist (physician) and Advanced Practice Providers or APPs (Physician Assistants and Nurse Practitioners) who all work together to provide you with the care you need, when you need it.  Your next appointment:    TBD         "

## 2024-01-23 LAB — CBC
Hematocrit: 41.9 % (ref 34.0–46.6)
Hemoglobin: 13.7 g/dL (ref 11.1–15.9)
MCH: 28.4 pg (ref 26.6–33.0)
MCHC: 32.7 g/dL (ref 31.5–35.7)
MCV: 87 fL (ref 79–97)
Platelets: 302 x10E3/uL (ref 150–450)
RBC: 4.82 x10E6/uL (ref 3.77–5.28)
RDW: 14.4 % (ref 11.7–15.4)
WBC: 7.9 x10E3/uL (ref 3.4–10.8)

## 2024-01-23 LAB — COMPREHENSIVE METABOLIC PANEL WITH GFR
ALT: 19 IU/L (ref 0–32)
AST: 13 IU/L (ref 0–40)
Albumin: 4.3 g/dL (ref 3.8–4.9)
Alkaline Phosphatase: 82 IU/L (ref 49–135)
BUN/Creatinine Ratio: 21 (ref 9–23)
BUN: 14 mg/dL (ref 6–24)
Bilirubin Total: 0.3 mg/dL (ref 0.0–1.2)
CO2: 27 mmol/L (ref 20–29)
Calcium: 10 mg/dL (ref 8.7–10.2)
Chloride: 103 mmol/L (ref 96–106)
Creatinine, Ser: 0.67 mg/dL (ref 0.57–1.00)
Globulin, Total: 2.4 g/dL (ref 1.5–4.5)
Glucose: 96 mg/dL (ref 70–99)
Potassium: 4.7 mmol/L (ref 3.5–5.2)
Sodium: 141 mmol/L (ref 134–144)
Total Protein: 6.7 g/dL (ref 6.0–8.5)
eGFR: 101 mL/min/1.73

## 2024-01-23 LAB — LIPASE: Lipase: 83 U/L — AB (ref 14–72)

## 2024-01-23 LAB — ANA

## 2024-01-23 LAB — AMYLASE: Amylase: 95 U/L (ref 31–110)

## 2024-01-23 LAB — SEDIMENTATION RATE: Sed Rate: 8 mm/h (ref 0–40)

## 2024-01-23 LAB — TSH: TSH: 1.24 u[IU]/mL (ref 0.450–4.500)

## 2024-01-25 ENCOUNTER — Ambulatory Visit: Payer: Self-pay | Admitting: Internal Medicine

## 2024-01-26 ENCOUNTER — Encounter: Payer: Self-pay | Admitting: Internal Medicine

## 2024-01-26 DIAGNOSIS — R21 Rash and other nonspecific skin eruption: Secondary | ICD-10-CM

## 2024-01-26 MED ORDER — METHYLPREDNISOLONE 4 MG PO TABS: ORAL_TABLET | ORAL | 0 refills | Status: AC

## 2024-01-26 NOTE — Telephone Encounter (Signed)
 Precomm  CHanging to medrol  dose pack:  32 mg, 24 mg, 20 mg, 16, 12, 8, 4, 4, 4,

## 2024-01-26 NOTE — Progress Notes (Signed)
 Forwarded to pt in mychart response. Awaiting clarification from Dr Okey regarding steroids

## 2024-01-26 NOTE — Addendum Note (Signed)
 Addended by: GLADIS REENA GAILS on: 01/26/2024 05:59 PM   Modules accepted: Orders

## 2024-02-01 NOTE — Telephone Encounter (Signed)
 Forward labs to PCP   They will review, decide on when to repeat noncardiac labs When patient is improved I would refer for Repatha  Pleas check on coverage

## 2024-02-02 NOTE — Telephone Encounter (Signed)
Labs routed to PCP. 

## 2024-02-08 ENCOUNTER — Encounter: Payer: Self-pay | Admitting: Internal Medicine

## 2024-02-10 NOTE — Telephone Encounter (Signed)
 Recommendations for PCP   Many have retired or aren't accepting new patients At [employer]:   I would recommend  [facility] or [employer]

## 2024-02-19 ENCOUNTER — Other Ambulatory Visit: Payer: Self-pay

## 2024-02-19 DIAGNOSIS — R21 Rash and other nonspecific skin eruption: Secondary | ICD-10-CM

## 2024-02-19 LAB — LIPASE: Lipase: 34 U/L (ref 14–72)

## 2024-02-20 ENCOUNTER — Inpatient Hospital Stay (HOSPITAL_COMMUNITY)

## 2024-02-20 ENCOUNTER — Telehealth: Payer: Self-pay | Admitting: Cardiology

## 2024-02-20 ENCOUNTER — Inpatient Hospital Stay (HOSPITAL_COMMUNITY)
Admission: EM | Admit: 2024-02-20 | Discharge: 2024-02-25 | DRG: 064 | Disposition: A | Discharge status: Rehabilitation Facility | Attending: Neurology | Admitting: Neurology

## 2024-02-20 ENCOUNTER — Emergency Department (HOSPITAL_COMMUNITY)

## 2024-02-20 ENCOUNTER — Other Ambulatory Visit: Payer: Self-pay

## 2024-02-20 DIAGNOSIS — G4089 Other seizures: Secondary | ICD-10-CM | POA: Diagnosis not present

## 2024-02-20 DIAGNOSIS — G40909 Epilepsy, unspecified, not intractable, without status epilepticus: Secondary | ICD-10-CM | POA: Diagnosis not present

## 2024-02-20 DIAGNOSIS — I959 Hypotension, unspecified: Secondary | ICD-10-CM | POA: Diagnosis not present

## 2024-02-20 DIAGNOSIS — I611 Nontraumatic intracerebral hemorrhage in hemisphere, cortical: Principal | ICD-10-CM | POA: Diagnosis present

## 2024-02-20 DIAGNOSIS — I676 Nonpyogenic thrombosis of intracranial venous system: Secondary | ICD-10-CM | POA: Diagnosis not present

## 2024-02-20 DIAGNOSIS — I619 Nontraumatic intracerebral hemorrhage, unspecified: Principal | ICD-10-CM | POA: Diagnosis present

## 2024-02-20 DIAGNOSIS — G8311 Monoplegia of lower limb affecting right dominant side: Secondary | ICD-10-CM | POA: Diagnosis present

## 2024-02-20 DIAGNOSIS — Z880 Allergy status to penicillin: Secondary | ICD-10-CM | POA: Diagnosis not present

## 2024-02-20 DIAGNOSIS — R29701 NIHSS score 1: Secondary | ICD-10-CM | POA: Diagnosis present

## 2024-02-20 DIAGNOSIS — Z79899 Other long term (current) drug therapy: Secondary | ICD-10-CM | POA: Diagnosis not present

## 2024-02-20 DIAGNOSIS — G441 Vascular headache, not elsewhere classified: Secondary | ICD-10-CM | POA: Diagnosis not present

## 2024-02-20 DIAGNOSIS — E66813 Obesity, class 3: Secondary | ICD-10-CM | POA: Diagnosis present

## 2024-02-20 DIAGNOSIS — R519 Headache, unspecified: Secondary | ICD-10-CM | POA: Diagnosis not present

## 2024-02-20 DIAGNOSIS — G08 Intracranial and intraspinal phlebitis and thrombophlebitis: Secondary | ICD-10-CM | POA: Diagnosis present

## 2024-02-20 DIAGNOSIS — E7800 Pure hypercholesterolemia, unspecified: Secondary | ICD-10-CM | POA: Diagnosis present

## 2024-02-20 DIAGNOSIS — Z8616 Personal history of COVID-19: Secondary | ICD-10-CM

## 2024-02-20 DIAGNOSIS — G936 Cerebral edema: Secondary | ICD-10-CM | POA: Diagnosis present

## 2024-02-20 DIAGNOSIS — G9349 Other encephalopathy: Secondary | ICD-10-CM | POA: Diagnosis present

## 2024-02-20 DIAGNOSIS — E119 Type 2 diabetes mellitus without complications: Secondary | ICD-10-CM | POA: Diagnosis present

## 2024-02-20 DIAGNOSIS — R7989 Other specified abnormal findings of blood chemistry: Secondary | ICD-10-CM | POA: Diagnosis not present

## 2024-02-20 DIAGNOSIS — Z794 Long term (current) use of insulin: Secondary | ICD-10-CM | POA: Diagnosis not present

## 2024-02-20 DIAGNOSIS — I495 Sick sinus syndrome: Secondary | ICD-10-CM | POA: Diagnosis present

## 2024-02-20 DIAGNOSIS — Z7901 Long term (current) use of anticoagulants: Secondary | ICD-10-CM | POA: Diagnosis not present

## 2024-02-20 DIAGNOSIS — R569 Unspecified convulsions: Secondary | ICD-10-CM | POA: Diagnosis present

## 2024-02-20 DIAGNOSIS — I609 Nontraumatic subarachnoid hemorrhage, unspecified: Secondary | ICD-10-CM | POA: Diagnosis present

## 2024-02-20 DIAGNOSIS — Z91018 Allergy to other foods: Secondary | ICD-10-CM

## 2024-02-20 DIAGNOSIS — D72829 Elevated white blood cell count, unspecified: Secondary | ICD-10-CM | POA: Diagnosis not present

## 2024-02-20 DIAGNOSIS — Q245 Malformation of coronary vessels: Secondary | ICD-10-CM | POA: Diagnosis not present

## 2024-02-20 DIAGNOSIS — Z8673 Personal history of transient ischemic attack (TIA), and cerebral infarction without residual deficits: Secondary | ICD-10-CM | POA: Diagnosis not present

## 2024-02-20 DIAGNOSIS — Z7982 Long term (current) use of aspirin: Secondary | ICD-10-CM

## 2024-02-20 DIAGNOSIS — R7401 Elevation of levels of liver transaminase levels: Secondary | ICD-10-CM | POA: Diagnosis not present

## 2024-02-20 DIAGNOSIS — Z888 Allergy status to other drugs, medicaments and biological substances status: Secondary | ICD-10-CM | POA: Diagnosis not present

## 2024-02-20 DIAGNOSIS — E785 Hyperlipidemia, unspecified: Secondary | ICD-10-CM | POA: Diagnosis not present

## 2024-02-20 DIAGNOSIS — I10 Essential (primary) hypertension: Secondary | ICD-10-CM | POA: Diagnosis present

## 2024-02-20 DIAGNOSIS — E1169 Type 2 diabetes mellitus with other specified complication: Secondary | ICD-10-CM | POA: Diagnosis not present

## 2024-02-20 DIAGNOSIS — Z6841 Body Mass Index (BMI) 40.0 and over, adult: Secondary | ICD-10-CM | POA: Diagnosis not present

## 2024-02-20 DIAGNOSIS — Z9049 Acquired absence of other specified parts of digestive tract: Secondary | ICD-10-CM

## 2024-02-20 DIAGNOSIS — E669 Obesity, unspecified: Secondary | ICD-10-CM | POA: Diagnosis not present

## 2024-02-20 LAB — COMPREHENSIVE METABOLIC PANEL WITH GFR
ALT: 33 U/L (ref 0–44)
AST: 31 U/L (ref 15–41)
Albumin: 4.4 g/dL (ref 3.5–5.0)
Alkaline Phosphatase: 79 U/L (ref 38–126)
Anion gap: 10 (ref 5–15)
BUN: 16 mg/dL (ref 6–20)
CO2: 26 mmol/L (ref 22–32)
Calcium: 9.4 mg/dL (ref 8.9–10.3)
Chloride: 101 mmol/L (ref 98–111)
Creatinine, Ser: 0.7 mg/dL (ref 0.44–1.00)
GFR, Estimated: >60 mL/min
Glucose, Bld: 109 mg/dL — ABNORMAL HIGH (ref 70–99)
Potassium: 4.3 mmol/L (ref 3.5–5.1)
Sodium: 137 mmol/L (ref 135–145)
Total Bilirubin: 0.2 mg/dL (ref 0.0–1.2)
Total Protein: 7.5 g/dL (ref 6.5–8.1)

## 2024-02-20 LAB — DIFFERENTIAL
Abs Immature Granulocytes: 0.03 K/uL (ref 0.00–0.07)
Basophils Absolute: 0 K/uL (ref 0.0–0.1)
Basophils Relative: 0 %
Eosinophils Absolute: 0.2 K/uL (ref 0.0–0.5)
Eosinophils Relative: 2 %
Immature Granulocytes: 0 %
Lymphocytes Relative: 36 %
Lymphs Abs: 2.6 K/uL (ref 0.7–4.0)
Monocytes Absolute: 0.5 K/uL (ref 0.1–1.0)
Monocytes Relative: 7 %
Neutro Abs: 4 K/uL (ref 1.7–7.7)
Neutrophils Relative %: 55 %

## 2024-02-20 LAB — I-STAT CHEM 8, ED
BUN: 18 mg/dL (ref 6–20)
Calcium, Ion: 1.12 mmol/L — ABNORMAL LOW (ref 1.15–1.40)
Chloride: 102 mmol/L (ref 98–111)
Creatinine, Ser: 0.7 mg/dL (ref 0.44–1.00)
Glucose, Bld: 107 mg/dL — ABNORMAL HIGH (ref 70–99)
HCT: 42 % (ref 36.0–46.0)
Hemoglobin: 14.3 g/dL (ref 12.0–15.0)
Potassium: 4 mmol/L (ref 3.5–5.1)
Sodium: 139 mmol/L (ref 135–145)
TCO2: 27 mmol/L (ref 22–32)

## 2024-02-20 LAB — HIV ANTIBODY (ROUTINE TESTING W REFLEX): HIV Screen 4th Generation wRfx: NONREACTIVE

## 2024-02-20 LAB — ETHANOL: Alcohol, Ethyl (B): <15 mg/dL

## 2024-02-20 LAB — CBG MONITORING, ED: Glucose-Capillary: 94 mg/dL (ref 70–99)

## 2024-02-20 LAB — CBC
HCT: 42.1 % (ref 36.0–46.0)
Hemoglobin: 13.8 g/dL (ref 12.0–15.0)
MCH: 28.1 pg (ref 26.0–34.0)
MCHC: 32.8 g/dL (ref 30.0–36.0)
MCV: 85.7 fL (ref 80.0–100.0)
Platelets: 271 K/uL (ref 150–400)
RBC: 4.91 MIL/uL (ref 3.87–5.11)
RDW: 14.5 % (ref 11.5–15.5)
WBC: 7.4 K/uL (ref 4.0–10.5)
nRBC: 0 % (ref 0.0–0.2)

## 2024-02-20 LAB — APTT: aPTT: 29 s (ref 24–36)

## 2024-02-20 LAB — HEMOGLOBIN A1C
Hgb A1c MFr Bld: 6.2 % — ABNORMAL HIGH (ref 4.8–5.6)
Mean Plasma Glucose: 131.24 mg/dL

## 2024-02-20 LAB — GLUCOSE, CAPILLARY
Glucose-Capillary: 119 mg/dL — ABNORMAL HIGH (ref 70–99)
Glucose-Capillary: 127 mg/dL — ABNORMAL HIGH (ref 70–99)
Glucose-Capillary: 142 mg/dL — ABNORMAL HIGH (ref 70–99)

## 2024-02-20 LAB — MRSA NEXT GEN BY PCR, NASAL: MRSA by PCR Next Gen: NOT DETECTED

## 2024-02-20 LAB — PROTIME-INR
INR: 0.9 (ref 0.8–1.2)
Prothrombin Time: 12.4 s (ref 11.4–15.2)

## 2024-02-20 MED ORDER — LORAZEPAM 2 MG/ML IJ SOLN
INTRAMUSCULAR | Status: AC
  Filled 2024-02-20: qty 1

## 2024-02-20 MED ORDER — ACETAMINOPHEN 650 MG RE SUPP: 650.0000 mg | RECTAL | Status: DC | PRN

## 2024-02-20 MED ORDER — ACETAMINOPHEN 325 MG PO TABS
650.0000 mg | ORAL_TABLET | ORAL | Status: DC | PRN
  Administered 2024-02-20 – 2024-02-23 (×7): 650 mg via ORAL
  Filled 2024-02-20 (×7): qty 2

## 2024-02-20 MED ORDER — HEPARIN (PORCINE) 25000 UT/250ML-% IV SOLN
850.0000 [IU]/h | INTRAVENOUS | Status: DC
  Administered 2024-02-20: 850 [IU]/h via INTRAVENOUS
  Administered 2024-02-22 – 2024-02-24 (×3): 1250 [IU]/h via INTRAVENOUS
  Filled 2024-02-20 (×5): qty 250

## 2024-02-20 MED ORDER — LABETALOL HCL 5 MG/ML IV SOLN: 20.0000 mg | Freq: Once | INTRAVENOUS | Status: DC

## 2024-02-20 MED ORDER — IOHEXOL 350 MG/ML SOLN
75.0000 mL | Freq: Once | INTRAVENOUS | Status: AC | PRN
  Administered 2024-02-20: 75 mL via INTRAVENOUS

## 2024-02-20 MED ORDER — STROKE: EARLY STAGES OF RECOVERY BOOK: Freq: Once | Status: AC

## 2024-02-20 MED ORDER — ACETAMINOPHEN 160 MG/5ML PO SOLN: 650.0000 mg | ORAL | Status: DC | PRN

## 2024-02-20 MED ORDER — CHLORHEXIDINE GLUCONATE CLOTH 2 % EX PADS
6.0000 | MEDICATED_PAD | Freq: Every day | CUTANEOUS | Status: DC
  Administered 2024-02-20 – 2024-02-22 (×3): 6 via TOPICAL

## 2024-02-20 MED ORDER — GADOBUTROL 1 MMOL/ML IV SOLN
7.0000 mL | Freq: Once | INTRAVENOUS | Status: AC | PRN
  Administered 2024-02-20: 7 mL via INTRAVENOUS

## 2024-02-20 MED ORDER — LEVETIRACETAM 500 MG PO TABS
1000.0000 mg | ORAL_TABLET | Freq: Two times a day (BID) | ORAL | Status: DC
  Administered 2024-02-21 – 2024-02-25 (×8): 1000 mg via ORAL
  Filled 2024-02-20 (×9): qty 2

## 2024-02-20 MED ORDER — LEVETIRACETAM (KEPPRA) 500 MG/5 ML ADULT IV PUSH
4500.0000 mg | Freq: Once | INTRAVENOUS | Status: AC
  Administered 2024-02-20: 4500 mg via INTRAVENOUS

## 2024-02-20 MED ORDER — LEVETIRACETAM (KEPPRA) 500 MG/5 ML ADULT IV PUSH: 1000.0000 mg | Freq: Two times a day (BID) | INTRAVENOUS | Status: DC

## 2024-02-20 MED ORDER — INSULIN ASPART 100 UNIT/ML IJ SOLN
0.0000 [IU] | Freq: Three times a day (TID) | INTRAMUSCULAR | Status: DC
  Administered 2024-02-21 (×3): 3 [IU] via SUBCUTANEOUS
  Administered 2024-02-22: 4 [IU] via SUBCUTANEOUS
  Administered 2024-02-22 – 2024-02-25 (×4): 3 [IU] via SUBCUTANEOUS
  Filled 2024-02-20 (×2): qty 3
  Filled 2024-02-20: qty 2
  Filled 2024-02-20 (×5): qty 3

## 2024-02-20 MED ORDER — SENNOSIDES-DOCUSATE SODIUM 8.6-50 MG PO TABS
1.0000 | ORAL_TABLET | Freq: Two times a day (BID) | ORAL | Status: DC
  Administered 2024-02-21 – 2024-02-25 (×6): 1 via ORAL
  Filled 2024-02-20 (×9): qty 1

## 2024-02-20 MED ORDER — CLEVIDIPINE BUTYRATE 0.5 MG/ML IV EMUL: 0.0000 mg/h | INTRAVENOUS | Status: DC

## 2024-02-20 MED ORDER — LORAZEPAM 2 MG/ML IJ SOLN
2.0000 mg | Freq: Once | INTRAMUSCULAR | Status: AC
  Administered 2024-02-20: 2 mg via INTRAVENOUS

## 2024-02-20 MED ORDER — LEVETIRACETAM (KEPPRA) 500 MG/5 ML ADULT IV PUSH
1000.0000 mg | Freq: Two times a day (BID) | INTRAVENOUS | Status: DC
  Administered 2024-02-20 – 2024-02-21 (×2): 1000 mg via INTRAVENOUS
  Filled 2024-02-20 (×5): qty 10

## 2024-02-20 MED ORDER — ORAL CARE MOUTH RINSE: 15.0000 mL | OROMUCOSAL | Status: DC | PRN

## 2024-02-20 MED ORDER — PANTOPRAZOLE SODIUM 40 MG IV SOLR
40.0000 mg | Freq: Every day | INTRAVENOUS | Status: DC
  Administered 2024-02-20 – 2024-02-21 (×2): 40 mg via INTRAVENOUS
  Filled 2024-02-20 (×2): qty 10

## 2024-02-20 MED ORDER — SODIUM CHLORIDE 0.9% FLUSH
3.0000 mL | Freq: Once | INTRAVENOUS | Status: AC
  Administered 2024-02-20: 3 mL via INTRAVENOUS

## 2024-02-20 MED ORDER — SODIUM CHLORIDE 0.9 % IV SOLN
200.0000 mg | Freq: Once | INTRAVENOUS | Status: AC
  Administered 2024-02-20: 200 mg via INTRAVENOUS
  Filled 2024-02-20: qty 20

## 2024-02-20 MED ORDER — INSULIN ASPART 100 UNIT/ML IJ SOLN: 0.0000 [IU] | Freq: Every day | INTRAMUSCULAR | Status: DC

## 2024-02-20 NOTE — H&P (Addendum)
 NEUROLOGY H&P NOTE   Date of service: February 20, 2024 Patient Name: Hailey [name] MRN:  [id number] DOB:  [birth year] Chief Complaint: R leg weakness, R foot twitching  History of Present Illness  Hailey [name] is a 59 y.o. female with hx of HTN (off medications), HLD, DM2, TIA, Migraines, Myocardial bridge who presented to ED c/o right leg weakness and numbness with disorientation to month and year. She reportedly was at work when the symptoms started. She came to the ED via EMS and a CODE STROKE activated by EDP with a LKW of  around 1330.   CTH reveals left parietal lobe ICH(4.49ml) with adjacent SAH and sulcal effacement with no midline shift. Patient was noted to have rhythmic R leg twitching concerning for focal seizure. She was given Keppra  4500mg  IV once, Ativan  2mg  IV once and Vimpat  200mg  IV once with resolution of R leg twitching.   Given the odd location and distribution of the ICH that did not seem to confine to single vessel distribution, CTA and CT Venogram head were obtained which is concerning for a partially occlusive superior saggital sinus thrombosis. 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   Last known well: 1330 Modified rankin score: 0-Completely asymptomatic and back to baseline post- stroke ICH Score: 0 tNKASE: Not offered due to ICH Thrombectomy: not offered due to ICH NIHSS components Score: Comment  1a Level of Conscious 0[x]  1[]  2[]  3[]      1b LOC Questions 0[x]  1[]  2[]     She initially said January and then later February. Still scored a 0 for this at this time.  1c LOC Commands 0[x]  1[]  2[]       2 Best Gaze 0[x]  1[]  2[]       3 Visual 0[]  1[x]  2[]  3[]    Inconsistent responses in R hemivisual field to double simultaneous stimulation.  4 Facial Palsy 0[x]  1[]  2[]  3[]      5a Motor Arm - left 0[x]  1[]  2[]  3[]  4[]  UN[]    5b Motor Arm - Right 0[x]  1[]  2[]  3[]  4[]  UN[]    6a Motor Leg - Left 0[x]  1[]  2[]  3[]  4[]  UN[]    6b Motor Leg - Right 0[x]   1[]  2[]  3[]  4[]  UN[]  R foot weakness is not scored on NIHSS  7 Limb Ataxia 0[x]  1[]  2[]  UN[]      8 Sensory 0[x]  1[]  2[]  UN[]      9 Best Language 0[x]  1[]  2[]  3[]      10 Dysarthria 0[x]  1[]  2[]  UN[]      11 Extinct. and Inattention 0[x]  1[]  2[]       TOTAL: 1      ROS  Comprehensive ROS performed and pertinent positives documented in the HPI.  Past History   Past Medical History:  Diagnosis Date   Angina at rest    Anxiety    Chest pain    Diabetes mellitus without complication (HCC)    High cholesterol    High cholesterol    History of angina    History of TIA (transient ischemic attack) 10/11/2020   Hypercholesterolemia 10/11/2020   Hyperlipidemia    Migraine    Personal history of COVID-19 10/11/2020   Radiculopathy of cervical region    TIA (transient ischemic attack)    Transient cerebral ischemic attack    Type II diabetes mellitus (HCC) 10/11/2020   Past Surgical History:  Procedure Laterality Date   BUNIONECTOMY     CHOLECYSTECTOMY     Family History  Problem Relation Age of Onset  Breast cancer Maternal Aunt    Breast cancer Paternal Aunt    Social History   Socioeconomic History   Marital status: Married    Spouse name: Not on file   Number of children: Not on file   Years of education: Not on file   Highest education level: Not on file  Occupational History   Not on file  Tobacco Use   Smoking status: Never   Smokeless tobacco: Never  Substance and Sexual Activity   Alcohol use: Never   Drug use: Never   Sexual activity: Not on file  Other Topics Concern   Not on file  Social History Narrative   Not on file   Social Drivers of Health   Tobacco Use: Low Risk (01/22/2024)   Patient History    Smoking Tobacco Use: Never    Smokeless Tobacco Use: Never    Passive Exposure: Not on file  Financial Resource Strain: Not on file  Food Insecurity: Not on file  Transportation Needs: Not on file  Physical Activity: Not on file  Stress: Not on  file  Social Connections: Not on file  Depression (EYV7-0): Not on file  Alcohol Screen: Not on file  Housing: Not on file  Utilities: Not on file  Health Literacy: Not on file   Allergies[1]  Medications  (Not in a hospital admission)    Vitals   Vitals:   02/20/24 1423 02/20/24 1433 02/20/24 1452  BP: (!) 153/69 135/62   Pulse: 90 98   Resp: 18 18   Temp: 98.2 F (36.8 C)    TempSrc: Oral    SpO2: 99% 100%   Weight:   99.9 kg     Body mass index is 41.61 kg/m.  Physical Exam   General: Laying comfortably in bed; in no acute distress.  HENT: Normal oropharynx and mucosa. Normal external appearance of ears and nose.  Neck: Supple, no pain or tenderness  CV: No JVD. No peripheral edema.  Pulmonary: Symmetric Chest rise. Normal respiratory effort.  Abdomen: Soft to touch, non-tender.  Ext: No cyanosis, edema, or deformity  Skin: No rash. Normal palpation of skin.   Musculoskeletal: Normal digits and nails by inspection. No clubbing.   Neurologic Examination  Mental status/Cognition: Alert, oriented to self, place, and year, good attention. Initially says that its January and then changed to February when asked again. Speech/language: Fluent, comprehension intact, object naming intact, repetition intact.  Cranial nerves:   CN II Pupils equal and reactive to light, inconsistent responses to visual field testing in R hemivisual field, specifically in R inferior visual field.   CN III,IV,VI EOM intact, no gaze preference or deviation, no nystagmus    CN V normal sensation in V1, V2, and V3 segments bilaterally   CN VII no asymmetry, no nasolabial fold flattening    CN VIII normal hearing to speech    CN IX & X normal palatal elevation, no uvular deviation    CN XI 5/5 head turn and 5/5 shoulder shrug bilaterally    CN XII midline tongue protrusion    Motor:  Muscle bulk: normal, tone normal, pronator drift none, having R foot rhythmic jerking concerning for  seizure. Mvmt Root Nerve  Muscle Right Left Comments  SA C5/6 Ax Deltoid 5 5   EF C5/6 Mc Biceps 5 5   EE C6/7/8 Rad Triceps 5 5   WF C6/7 Med FCR     WE C7/8 PIN ECU     F Ab C8/T1  U ADM/FDI 5 5   HF L1/2/3 Fem Illopsoas 4 5   KE L2/3/4 Fem Quad     DF L4/5 D Peron Tib Ant 1 5   PF S1/2 Tibial Grc/Sol 1 5    Sensation:  Light touch Reports tingling but intact to light touch   Pin prick    Temperature    Vibration   Proprioception    Coordination/Complex Motor:  - Finger to Nose intact BL - Heel to shin unable to do with RLE - Rapid alternating movement are slowed in RUE - Gait: deferred.  Labs   CBC:  Recent Labs  Lab 02/20/24 1511 02/20/24 1514  WBC 7.4  --   NEUTROABS 4.0  --   HGB 13.8 14.3  HCT 42.1 42.0  MCV 85.7  --   PLT 271  --     Basic Metabolic Panel:  Lab Results  Component Value Date   NA 139 02/20/2024   K 4.0 02/20/2024   CO2 27 01/22/2024   GLUCOSE 107 (H) 02/20/2024   BUN 18 02/20/2024   CREATININE 0.70 02/20/2024   CALCIUM  10.0 01/22/2024   GFRNONAA >60 07/25/2020   Lipid Panel: No results found for: LDLCALC HgbA1c:  Lab Results  Component Value Date   HGBA1C 6.8 (H) 12/13/2020   Urine Drug Screen: No results found for: LABOPIA, COCAINSCRNUR, LABBENZ, AMPHETMU, THCU, LABBARB  Alcohol Level No results found for: ETH INR No results found for: INR APTT No results found for: APTT   CT Head without contrast(Personally reviewed): Hemorrhagic infarct in the right parietal lobe measuring 2.1 x 1.6 x 2.8 cm (volume 4.7 mL), with adjacent subarachnoid hemorrhage and sulcal effacement without midline shift  CT angio Head and Neck with contrast(Personally reviewed): Hemorrhagic infarct in the left parietal lobe. Nonopacification of the superior sagittal sinus above the torcular with prominent cortical veins, consistent with for superior sagittal sinus thrombosis. Mild narrowing at the origin of the right vertebral  artery.  CT Venogram Head:   Filling defect involving the superior sagittal sinus to the level of the torcular, concerning for dural venous sinus thrombosis.  MRV Head with and without(Personally reviewed): Diffuse thrombus confirmed within the superior sagittal sinus.   MRI Brain with and without(Personally reviewed): Stable hemorrhagic infarct in the high posterior left parietal lobe with surrounding vasogenic edema and local sulcal effacement, without midline shift. 2. Superior sagittal sinus thrombosis.  Impression   Hailey [name] is a 59 y.o. female p/w headache x 2-3 days, who had R leg tingling/numbness and twitching at work and brought into ED by EMS and a code stroke activated.  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  Primary Diagnosis:  Nontraumatic intracerebral hemorrhage in hemisphere, cortical  Secondary Diagnosis: Morbid Obesity(BMI > 40)  Recommendations  L parietal lobe ICH with ICH score of 0: - Admit to ICU - Stability scan in 4 hours or STAT with any neurological decline, then again 8 hours later. - Frequent neuro checks; q74min for 1 hour, then q1hour - Blood pressure control with goal systolic 130 - 150, cleverplex and labetalol  PRN - Stroke labs, HgbA1c, fasting lipid panel - MRI brain with and without contrast when stabilized to evaluate for underlying mass - MR Venogram head with and without contrast to evaluate for dural venous sinus thrombosis. - Risk factor modification - Echocardiogram - PT consult, OT consult, Speech consult. -  Heparin  gtt neuro scale with goal heparin  levels between 0.3-0.5. - Stroke team to follow  Focal L hemispheric seizure with retained awareness with rhythmic R foot twitching: - Keppra  4500mg  IV once, Ativan  2mg  Iv once, vimpat  200mg  IV once given so far. - continue Keppra  1000mg  BID -  cEEG.  DM2: Sliding scale insulin  with carb modified diet.  Obesity class 3 with BMI of over 40.  ______________________________________________________________________  This patient is critically ill and at significant risk of neurological worsening, death and care requires constant monitoring of vital signs, hemodynamics,respiratory and cardiac monitoring, neurological assessment, discussion with family, other specialists and medical decision making of high complexity. I spent 90 minutes of neurocritical care time  in the care of  this patient. This was time spent independent of any time provided by nurse practitioner or PA.  Astha Probasco [facility] 02/20/2024  9:28 PM   Signed,  Lannie Yusuf [facility]     [1]  Allergies Allergen Reactions   Hazelnut (Filbert) Anaphylaxis   Penicillins Anaphylaxis   Coconut (Cocos Nucifera)    Cocos Nucifera    Tirzepatide Rash

## 2024-02-20 NOTE — Progress Notes (Signed)
 PHARMACY - ANTICOAGULATION CONSULT NOTE  Pharmacy Consult for Heparin  Indication: stroke  Allergies[1]  Patient Measurements: Height: 5' 1 (154.9 cm) Weight: 99.9 kg (220 lb 3.8 oz) IBW/kg (Calculated) : 47.8 HEPARIN  DW (KG): 71.8  Vital Signs: Temp: 98.2 F (36.8 C) (01/17 1423) Temp Source: Oral (01/17 1423) BP: 128/69 (01/17 1713) Pulse Rate: 82 (01/17 1713)  Labs: Recent Labs    02/20/24 1511 02/20/24 1514  HGB 13.8 14.3  HCT 42.1 42.0  PLT 271  --   APTT 29  --   LABPROT 12.4  --   INR 0.9  --   CREATININE 0.70 0.70    Estimated Creatinine Clearance: 82 mL/min (by C-G formula based on SCr of 0.7 mg/dL).   Medical History: Past Medical History:  Diagnosis Date   Angina at rest    Anxiety    Chest pain    Diabetes mellitus without complication (HCC)    High cholesterol    High cholesterol    History of angina    History of TIA (transient ischemic attack) 10/11/2020   Hypercholesterolemia 10/11/2020   Hyperlipidemia    Migraine    Personal history of COVID-19 10/11/2020   Radiculopathy of cervical region    TIA (transient ischemic attack)    Transient cerebral ischemic attack    Type II diabetes mellitus (HCC) 10/11/2020    Medications:  Scheduled:   [START ON 02/21/2024]  stroke: early stages of recovery book   Does not apply Once   labetalol   20 mg Intravenous Once   levETIRAcetam   1,000 mg Intravenous BID   Or   levETIRAcetam   1,000 mg Oral BID   pantoprazole  (PROTONIX ) IV  40 mg Intravenous QHS   senna-docusate  1 tablet Oral BID   sodium chloride  flush  3 mL Intravenous Once   Infusions:   clevidipine  Stopped (02/20/24 1612)   lacosamide  (VIMPAT ) IV     PRN: acetaminophen  **OR** acetaminophen  (TYLENOL ) oral liquid 160 mg/5 mL **OR** acetaminophen   Assessment: 59 yo female presents as a code stroke. CTA and CT Venogram head were obtained which is concerning for a partially occlusive superior saggital sinus thrombosis. Pharmacy  consulted to dose heparin  neuro protocol.  Goal of Therapy:  Heparin  level 0.3-0.5 units/ml Monitor platelets by anticoagulation protocol: Yes   Plan:  Start heparin  infusion at 850 units/hr Check anti-Xa level in 6 hours and daily while on heparin  Continue to monitor H&H and platelets  Rocky Slade, PharmD, BCPS 02/20/2024,5:34 PM  Please check AMION for all [facility] Pharmacy phone numbers After 10:00 PM, call Main Pharmacy [phone]       [1]  Allergies Allergen Reactions   Hazelnut (Filbert) Anaphylaxis   Penicillins Anaphylaxis   Coconut (Cocos Nucifera)    Cocos Nucifera    Tirzepatide Rash

## 2024-02-20 NOTE — ED Triage Notes (Addendum)
 Patient with hx of CVA here via EMS for eval of  tingling in R leg x 30 mins PTA. Taken off all medications including blood thinner two weeks ago. At this time patient endorses severe pain in the leg.

## 2024-02-20 NOTE — Progress Notes (Signed)
 LTM EEG hooked up and running - no initial skin breakdown - push button tested - [facility] monitoring.

## 2024-02-20 NOTE — ED Notes (Signed)
 Unable to assess pupils and NIH at 1700 d/t patient in MRI scanner.

## 2024-02-20 NOTE — ED Notes (Signed)
 Unable to assess pupils and NIH at 1600 d/t patient in MRI scanner.

## 2024-02-20 NOTE — Telephone Encounter (Addendum)
 Received page stating that patient was taken to [facility] with concerns for a possible stroke.  I attempted to call back the number listed in the page [phone], there was no response to this number nor was able to leave a voicemail.  Will attempt to call back in a short period of time.  Suspect if cardiology services are needed during admission we will be consulted.  Waddell DELENA Donath, PA-C 02/20/2024 2:41 PM  Called back same phone number. Was able to speak to the patients husband. He just wanted to make sure that our office, and Dr. Okey, knew that something was going on. I reassured him that as everything is within the [facility] system that Dr. Okey as well as the rest of our team will have access to all of the notes/workup during this admission. No further questions.   Waddell DELENA Donath, PA-C 02/20/2024 4:01 PM

## 2024-02-20 NOTE — ED Provider Notes (Signed)
 " Hailey [name] EMERGENCY DEPARTMENT AT [facility] Provider Note   CSN: [id number] Arrival date & time: 02/20/24  1411     Patient presents with: No chief complaint on file.   Hailey [name] is a 59 y.o. female with a PMHx notable for myocardial bridge, HTN, DM, HLD who presents today for evaluation of strokelike symptoms.  Patient reports that she was at work when she had sudden onset malaise and right lower extremity numbness.  Patient also describes generalized headache with blurred vision.  Due to symptoms presenting today for further evaluation.  HPI     Prior to Admission medications  Medication Sig Start Date End Date Taking? Authorizing Provider  amLODipine  (NORVASC ) 5 MG tablet Take 1 tablet (5 mg total) by mouth daily. Patient not taking: Reported on 01/22/2024 08/01/21   Okey Vina GAILS, MD  aspirin EC 81 MG tablet Take 81 mg by mouth daily. Swallow whole. Patient not taking: Reported on 01/22/2024    [provider]  busPIRone (BUSPAR) 5 MG tablet Take 5 mg by mouth daily as needed. Patient not taking: Reported on 01/22/2024    [provider]  EPINEPHrine 0.3 mg/0.3 mL IJ SOAJ injection as needed.    [provider]  furosemide (LASIX) 40 MG tablet Take 20 mg by mouth daily. 05/17/21   [provider]  hydrochlorothiazide (HYDRODIURIL) 25 MG tablet Take 25 mg by mouth daily. Patient not taking: Reported on 01/22/2024    [provider]  ibuprofen  (ADVIL ) 800 MG tablet TAKE ONE TABLET BY MOUTH THREE TIMES DAILY AS NEEDED Patient not taking: Reported on 01/22/2024 01/30/23   Claudene Arthea HERO, DO  lidocaine  (LIDODERM ) 5 % Place 1 patch onto the skin daily. Remove & Discard patch within 12 hours or as directed by MD Patient not taking: Reported on 01/22/2024 04/02/23   Leonce Katz, DO  meloxicam  (MOBIC ) 15 MG tablet Take 1 tablet (15 mg total) by mouth daily as needed for pain. Patient not taking: Reported on 01/22/2024  10/21/23   Leonce Katz, DO  naproxen (NAPROSYN) 500 MG tablet as needed for pain. Patient not taking: Reported on 01/22/2024    [provider]  nitroGLYCERIN  (NITROSTAT ) 0.4 MG SL tablet Place 1 tablet (0.4 mg total) under the tongue every 5 (five) minutes as needed for chest pain. Patient not taking: Reported on 01/22/2024 07/26/21   Okey Vina GAILS, MD  OZEMPIC, 0.25 OR 0.5 MG/DOSE, 2 MG/3ML SOPN Inject into the skin once a week. Every friday Patient not taking: Reported on 01/22/2024 06/24/21   [provider]  PEPCID 20 MG tablet Take 20 mg by mouth as needed for heartburn or indigestion. 01/30/22   [provider]  rosuvastatin  (CRESTOR ) 10 MG tablet Take 1 tablet (10 mg total) by mouth daily. Patient not taking: Reported on 01/22/2024 10/21/21   Okey Vina GAILS, MD  tiZANidine  (ZANAFLEX ) 2 MG tablet TAKE ONE TABLET BY MOUTH DAILY AT BEDTIME Patient not taking: Reported on 01/22/2024 04/02/21   Smith, Zachary M, DO    Allergies: Hazelnut (filbert), Penicillins, Coconut (cocos nucifera), Cocos nucifera, and Tirzepatide    Review of Systems  Updated Vital Signs BP 135/62 (BP Location: Left Arm)   Pulse 98   Temp 98.2 F (36.8 C) (Oral)   Resp 18   Wt 99.9 kg   SpO2 100%   BMI 41.61 kg/m   Physical Exam Constitutional:      General: She is not in acute distress.  Appearance: Normal appearance.  HENT:     Head: Normocephalic and atraumatic.     Right Ear: External ear normal.     Left Ear: External ear normal.     Nose: Nose normal.     Mouth/Throat:     Mouth: Mucous membranes are moist.  Eyes:     Pupils: Pupils are equal, round, and reactive to light.  Cardiovascular:     Rate and Rhythm: Normal rate and regular rhythm.     Pulses: Normal pulses.  Pulmonary:     Effort: Pulmonary effort is normal.     Breath sounds: Normal breath sounds.  Abdominal:     General: Abdomen is flat. There is no distension.     Tenderness: There is no  abdominal tenderness.  Musculoskeletal:     Cervical back: Normal range of motion.  Skin:    General: Skin is warm.     Capillary Refill: Capillary refill takes less than 2 seconds.  Neurological:     Mental Status: She is disoriented.     Cranial Nerves: No cranial nerve deficit.     Sensory: Sensory deficit present.     Motor: Weakness present.  Psychiatric:        Mood and Affect: Mood normal.     (all labs ordered are listed, but only abnormal results are displayed) Labs Reviewed  PROTIME-INR  APTT  CBC  DIFFERENTIAL  COMPREHENSIVE METABOLIC PANEL WITH GFR  ETHANOL  CBG MONITORING, ED  I-STAT CHEM 8, ED    EKG: None  Radiology: No results found.  Procedures   Medications Ordered in the ED  sodium chloride  flush (NS) 0.9 % injection 3 mL (has no administration in time range)    Clinical Course as of 02/20/24 1459  Sat Feb 20, 2024  1457 S - Sudden onset LE weakness, confusion. Code stroke [DZ]    Clinical Course User Index [DZ] Laurita Sieving, MD                               Medical Decision Making Risk Decision regarding hospitalization.   Patient is a 59 year old female who presents today for evaluation of sudden onset headache as well as right lower extremity weakness and paresthesias.  On initial assessment patient was noted to be hemodynamically stable and afebrile.  Patient was initially seen by Dr. Levander and due to sudden onset neurologic symptoms was activated as a stroke alert.  Patient was seen in conjunction with neurology.  Imaging studies with evidence of a right parietal lobe IPH with surrounding subarachnoid hemorrhage.  Additionally patient underwent a CT venogram that showed evidence of potential dural venous sinus thrombosis.  Patient remained clinically and hemodynamically stable during her time in the emergency department.  Remains somnolent however easily arousable to verbal stimuli and with mild confusion.  No other progression of  neurologic symptoms.  Will ultimately need admission to the neuro ICU at this point in time for further management.  Final diagnoses:  Intraparenchymal hemorrhage of brain [facility])    ED Discharge Orders     None          Laurita Sieving, MD 02/20/24 1659    Emil Share, DO 02/20/24 1712  "

## 2024-02-20 NOTE — ED Provider Notes (Signed)
 59 year old female presents today complaining of sudden onset of right leg weakness and paresthesias.  She works at [employer].  He reports that they called him for work and stated that her leg was tingling and she was too weak to stand on it. Here on evaluation patient is normotensive with normal vitals. She is confused about the month although getting in the year correct on my evaluation initially had some confusion. She has decreased strength in her right lower extremity and is able to raise it only a couple of inches off the bed and then drops back down she has good strength in her left lower extremity Palmar drift is assessed and she appears to have some difficulty sitting up.  No obvious palmar drift is noted She has decreased sensation of the right lower extremity to the left lower extremity normal facial sensation and normal bilateral upper extremity sensation No facial asymmetry    Levander Houston, MD 02/20/24 1450

## 2024-02-20 NOTE — Code Documentation (Signed)
 Stroke Response Nurse Documentation Code Documentation  Hailey [name] is a 59 y.o. female arriving to [facility]  via [facility] on 02/20/2024 with past medical hx of CVA, HTN, DM, HLD, myocardial bridge. On No antithrombotic. Code stroke was activated by ED.   Patient from home where she was LKW at 1345 and now complaining of right leg tingling. Per patient, she was at work when she had tingling and numbness in her leg and was unable to stand on it.   Stroke team at the bedside on patient arrival. Labs drawn and patient cleared for CT by Dr. Levander. Patient to CT with team. NIHSS 0, see documentation for details and code stroke times.  The following imaging was completed:  CT Head and CTA. Patient is not a candidate for IV Thrombolytic due to ICH. Patient is not a candidate for IR due to ICH.   Care Plan: VS/Pupil Checks/NIHSS q1hr until the order changes; BP Goal 130-150; NPO until swallow screen passed.   Bedside handoff with ED RN Camie.    Hailey [name]  Stroke Response RN

## 2024-02-21 ENCOUNTER — Inpatient Hospital Stay (HOSPITAL_COMMUNITY)

## 2024-02-21 ENCOUNTER — Ambulatory Visit: Payer: Self-pay | Admitting: Internal Medicine

## 2024-02-21 DIAGNOSIS — E669 Obesity, unspecified: Secondary | ICD-10-CM | POA: Diagnosis not present

## 2024-02-21 DIAGNOSIS — G936 Cerebral edema: Secondary | ICD-10-CM

## 2024-02-21 DIAGNOSIS — E785 Hyperlipidemia, unspecified: Secondary | ICD-10-CM

## 2024-02-21 DIAGNOSIS — Z6841 Body Mass Index (BMI) 40.0 and over, adult: Secondary | ICD-10-CM

## 2024-02-21 DIAGNOSIS — I10 Essential (primary) hypertension: Secondary | ICD-10-CM | POA: Diagnosis not present

## 2024-02-21 DIAGNOSIS — E119 Type 2 diabetes mellitus without complications: Secondary | ICD-10-CM | POA: Diagnosis not present

## 2024-02-21 DIAGNOSIS — I611 Nontraumatic intracerebral hemorrhage in hemisphere, cortical: Secondary | ICD-10-CM | POA: Diagnosis not present

## 2024-02-21 DIAGNOSIS — R569 Unspecified convulsions: Secondary | ICD-10-CM

## 2024-02-21 DIAGNOSIS — R29701 NIHSS score 1: Secondary | ICD-10-CM | POA: Diagnosis not present

## 2024-02-21 DIAGNOSIS — G08 Intracranial and intraspinal phlebitis and thrombophlebitis: Secondary | ICD-10-CM | POA: Diagnosis not present

## 2024-02-21 DIAGNOSIS — I609 Nontraumatic subarachnoid hemorrhage, unspecified: Secondary | ICD-10-CM | POA: Diagnosis not present

## 2024-02-21 LAB — COMPREHENSIVE METABOLIC PANEL WITH GFR
ALT: 27 U/L (ref 0–44)
AST: 17 U/L (ref 15–41)
Albumin: 4.1 g/dL (ref 3.5–5.0)
Alkaline Phosphatase: 72 U/L (ref 38–126)
Anion gap: 10 (ref 5–15)
BUN: 13 mg/dL (ref 6–20)
CO2: 24 mmol/L (ref 22–32)
Calcium: 9.2 mg/dL (ref 8.9–10.3)
Chloride: 102 mmol/L (ref 98–111)
Creatinine, Ser: 0.63 mg/dL (ref 0.44–1.00)
GFR, Estimated: >60 mL/min
Glucose, Bld: 133 mg/dL — ABNORMAL HIGH (ref 70–99)
Potassium: 3.9 mmol/L (ref 3.5–5.1)
Sodium: 136 mmol/L (ref 135–145)
Total Bilirubin: 0.5 mg/dL (ref 0.0–1.2)
Total Protein: 6.8 g/dL (ref 6.5–8.1)

## 2024-02-21 LAB — GLUCOSE, CAPILLARY
Glucose-Capillary: 146 mg/dL — ABNORMAL HIGH (ref 70–99)
Glucose-Capillary: 144 mg/dL — ABNORMAL HIGH (ref 70–99)
Glucose-Capillary: 127 mg/dL — ABNORMAL HIGH (ref 70–99)
Glucose-Capillary: 144 mg/dL — ABNORMAL HIGH (ref 70–99)
Glucose-Capillary: 157 mg/dL — ABNORMAL HIGH (ref 70–99)
Glucose-Capillary: 143 mg/dL — ABNORMAL HIGH (ref 70–99)

## 2024-02-21 LAB — CBC
HCT: 38.3 % (ref 36.0–46.0)
Hemoglobin: 13.1 g/dL (ref 12.0–15.0)
MCH: 28.4 pg (ref 26.0–34.0)
MCHC: 34.2 g/dL (ref 30.0–36.0)
MCV: 83.1 fL (ref 80.0–100.0)
Platelets: 276 K/uL (ref 150–400)
RBC: 4.61 MIL/uL (ref 3.87–5.11)
RDW: 14.7 % (ref 11.5–15.5)
WBC: 9.5 K/uL (ref 4.0–10.5)
nRBC: 0 % (ref 0.0–0.2)

## 2024-02-21 LAB — LIPID PANEL
Cholesterol: 233 mg/dL — ABNORMAL HIGH (ref 0–200)
HDL: 64 mg/dL
LDL Cholesterol: 140 mg/dL — ABNORMAL HIGH (ref 0–99)
Total CHOL/HDL Ratio: 3.6 ratio
Triglycerides: 144 mg/dL
VLDL: 29 mg/dL (ref 0–40)

## 2024-02-21 LAB — URINALYSIS, ROUTINE W REFLEX MICROSCOPIC
Bilirubin Urine: NEGATIVE
Glucose, UA: NEGATIVE mg/dL
Hgb urine dipstick: NEGATIVE
Ketones, ur: NEGATIVE mg/dL
Leukocytes,Ua: NEGATIVE
Nitrite: NEGATIVE
Protein, ur: NEGATIVE mg/dL
Specific Gravity, Urine: 1.042 — ABNORMAL HIGH (ref 1.005–1.030)
pH: 6 (ref 5.0–8.0)

## 2024-02-21 LAB — ANTITHROMBIN III: AntiThromb III Func: 90 % (ref 75–120)

## 2024-02-21 LAB — URINE DRUG SCREEN
Amphetamines: NEGATIVE
Barbiturates: NEGATIVE
Benzodiazepines: POSITIVE — AB
Cocaine: NEGATIVE
Fentanyl: NEGATIVE
Methadone Scn, Ur: NEGATIVE
Opiates: NEGATIVE
Tetrahydrocannabinol: NEGATIVE

## 2024-02-21 LAB — HEPARIN LEVEL (UNFRACTIONATED)
Heparin Unfractionated: 0.14 [IU]/mL — ABNORMAL LOW (ref 0.30–0.70)
Heparin Unfractionated: 0.15 [IU]/mL — ABNORMAL LOW (ref 0.30–0.70)
Heparin Unfractionated: 0.33 [IU]/mL (ref 0.30–0.70)
Heparin Unfractionated: 0.33 [IU]/mL (ref 0.30–0.70)

## 2024-02-21 LAB — VITAMIN B12: Vitamin B-12: 580 pg/mL (ref 180–914)

## 2024-02-21 MED ORDER — BUTALBITAL-APAP-CAFFEINE 50-325-40 MG PO TABS
1.0000 | ORAL_TABLET | Freq: Three times a day (TID) | ORAL | Status: DC | PRN
  Administered 2024-02-21 – 2024-02-25 (×10): 1 via ORAL
  Filled 2024-02-21 (×11): qty 1

## 2024-02-21 MED ORDER — ATORVASTATIN CALCIUM 40 MG PO TABS
40.0000 mg | ORAL_TABLET | Freq: Every day | ORAL | Status: DC
  Administered 2024-02-21 – 2024-02-25 (×5): 40 mg via ORAL
  Filled 2024-02-21 (×5): qty 1

## 2024-02-21 NOTE — Progress Notes (Signed)
 PHARMACY - ANTICOAGULATION CONSULT NOTE  Pharmacy Consult for Heparin  Indication: superior sagittal sinus thrombosis  Allergies[1]  Patient Measurements: Height: 5' 1 (154.9 cm) Weight: 99.9 kg (220 lb 3.8 oz) IBW/kg (Calculated) : 47.8 HEPARIN  DW (KG): 71.8  Vital Signs: Temp: 98.2 F (36.8 C) (01/18 1200) Temp Source: Oral (01/18 1200) BP: 113/62 (01/18 1239) Pulse Rate: 86 (01/18 1239)  Labs: Recent Labs    02/20/24 1511 02/20/24 1514 02/21/24 0013 02/21/24 0528 02/21/24 1153  HGB 13.8 14.3  --  13.1  --   HCT 42.1 42.0  --  38.3  --   PLT 271  --   --  276  --   APTT 29  --   --   --   --   LABPROT 12.4  --   --   --   --   INR 0.9  --   --   --   --   HEPARINUNFRC  --   --  0.15* 0.14* 0.33  CREATININE 0.70 0.70  --  0.63  --     Estimated Creatinine Clearance: 82 mL/min (by C-G formula based on SCr of 0.63 mg/dL).   Medical History: Past Medical History:  Diagnosis Date   Angina at rest    Anxiety    Chest pain    Diabetes mellitus without complication (HCC)    High cholesterol    High cholesterol    History of angina    History of TIA (transient ischemic attack) 10/11/2020   Hypercholesterolemia 10/11/2020   Hyperlipidemia    Migraine    Personal history of COVID-19 10/11/2020   Radiculopathy of cervical region    TIA (transient ischemic attack)    Transient cerebral ischemic attack    Type II diabetes mellitus (HCC) 10/11/2020     Assessment: 59 yo female presents with L Parietal hemorrhage/SAH, partially occlusive superior saggital sinus thrombosis. Pharmacy consulted to dose heparin  neuro protocol.  Heparin  level 0.33 is therapeutic on 1200 units/hr. CBC stable   Goal of Therapy:  Heparin  level 0.3-0.5 units/ml Monitor platelets by anticoagulation protocol: Yes   Plan:  Continue Heparin  12000 units/hr F/u confirmatory level  Monitor daily heparin  level, CBC, signs/symptoms of bleeding    Jinnie Door, PharmD, BCPS, BCCP Clinical  Pharmacist  Please check AMION for all [facility] Pharmacy phone numbers After 10:00 PM, call Main Pharmacy [phone]     [1]  Allergies Allergen Reactions   Hazelnut (Filbert) Anaphylaxis   Penicillins Anaphylaxis   Coconut (Cocos Nucifera)    Cocos Nucifera    Ozempic (0.25 Or 0.5 Mg-Dose) [Semaglutide(0.25 Or 0.5mg -Dos)] Rash   Rosuvastatin  Other (See Comments)    Dizzy per pt   Tirzepatide Rash

## 2024-02-21 NOTE — Progress Notes (Signed)
 OT Cancellation Note  Patient Details Name: Hailey [name] MRN: [id number] DOB: [birth year]   Cancelled Treatment:    Reason Eval/Treat Not Completed: Active bedrest order  Ely Molt 02/21/2024, 8:03 AM

## 2024-02-21 NOTE — Plan of Care (Signed)
" °  Problem: Education: Goal: Knowledge of disease or condition will improve Outcome: Progressing Goal: Knowledge of secondary prevention will improve (MUST DOCUMENT ALL) Outcome: Progressing Goal: Knowledge of patient specific risk factors will improve (DELETE if not current risk factor) Outcome: Progressing   Problem: Coping: Goal: Will identify appropriate support needs Outcome: Progressing   Problem: Health Behavior/Discharge Planning: Goal: Ability to manage health-related needs will improve Outcome: Progressing   "

## 2024-02-21 NOTE — Procedures (Signed)
 Patient Name: Hailey [name]  MRN: [id number]  Epilepsy Attending: Arlin MALVA Krebs  Referring Physician/Provider: Judithe Rocky BROCKS, NP  Duration: 02/20/2024 1758 to 02/21/2024 1758  Patient history: 59 y.o. female p/w headache x 2-3 days, who had R leg tingling/numbness and twitching at work. EEG to evaluate for seizure  Level of alertness: Awake, asleep  AEDs during EEG study: LEV  Technical aspects: This EEG study was done with scalp electrodes positioned according to the 10-20 International system of electrode placement. Electrical activity was reviewed with band pass filter of 1-70Hz , sensitivity of 7 uV/mm, display speed of 65mm/sec with a 60Hz  notched filter applied as appropriate. EEG data were recorded continuously and digitally stored.  Video monitoring was available and reviewed as appropriate.  Description: The posterior dominant rhythm consists of 9 Hz activity of moderate voltage (25-35 uV) seen predominantly in posterior head regions, symmetric and reactive to eye opening and eye closing. Sleep was characterized by vertex waves, sleep spindles (12 to 14 Hz), maximal frontocentral region. There is continuous 3 to 6 Hz theta-delta slowing in left hemisphere. Intermittent generalized 3-6h thetas-delta slowing was also noted,  admixed with 13-15hz  beta activity. Hyperventilation and photic stimulation were not performed.     ABNORMALITY - Continuous slow, left hemisphere - Intermittent slow, generalized  IMPRESSION: This study is suggestive of cortical dysfunction arising from left hemisphere likely secondary to underlying structural abnormality. Additionally there is mild diffuse encephalopathy. No seizures or epileptiform discharges were seen throughout the recording.  Taylan Marez O Yariela Tison

## 2024-02-21 NOTE — Progress Notes (Signed)
 PHARMACY - ANTICOAGULATION CONSULT NOTE  Pharmacy Consult for Heparin  Indication: superior sagittal sinus thrombosis  Allergies[1]  Patient Measurements: Height: 5' 1 (154.9 cm) Weight: 99.9 kg (220 lb 3.8 oz) IBW/kg (Calculated) : 47.8 HEPARIN  DW (KG): 71.8  Vital Signs: Temp: 98.1 F (36.7 C) (01/18 1549) Temp Source: Oral (01/18 1549) BP: 108/56 (01/18 1730) Pulse Rate: 82 (01/18 1730)  Labs: Recent Labs    02/20/24 1511 02/20/24 1514 02/21/24 0013 02/21/24 0528 02/21/24 1153 02/21/24 1839  HGB 13.8 14.3  --  13.1  --   --   HCT 42.1 42.0  --  38.3  --   --   PLT 271  --   --  276  --   --   APTT 29  --   --   --   --   --   LABPROT 12.4  --   --   --   --   --   INR 0.9  --   --   --   --   --   HEPARINUNFRC  --   --    < > 0.14* 0.33 0.33  CREATININE 0.70 0.70  --  0.63  --   --    < > = values in this interval not displayed.    Estimated Creatinine Clearance: 82 mL/min (by C-G formula based on SCr of 0.63 mg/dL).   Medical History: Past Medical History:  Diagnosis Date   Angina at rest    Anxiety    Chest pain    Diabetes mellitus without complication (HCC)    High cholesterol    High cholesterol    History of angina    History of TIA (transient ischemic attack) 10/11/2020   Hypercholesterolemia 10/11/2020   Hyperlipidemia    Migraine    Personal history of COVID-19 10/11/2020   Radiculopathy of cervical region    TIA (transient ischemic attack)    Transient cerebral ischemic attack    Type II diabetes mellitus (HCC) 10/11/2020     Assessment: 60 yo female presents with L Parietal hemorrhage/SAH, partially occlusive superior saggital sinus thrombosis. Pharmacy consulted to dose heparin  neuro protocol.  Confirmatory Heparin  level 0.33 remains therapeutic on heparin  drip 1200 units/hr. CBC stable   Goal of Therapy:  Heparin  level 0.3-0.5 units/ml Monitor platelets by anticoagulation protocol: Yes   Plan:  Continue Heparin  12000  units/hr Monitor daily heparin  level, CBC, signs/symptoms of bleeding    Olam Chalk Pharm.D. CPP, BCPS Clinical Pharmacist [phone] 02/21/2024 7:13 PM   Please check AMION for all [facility] Pharmacy phone numbers After 10:00 PM, call Main Pharmacy [phone]      [1]  Allergies Allergen Reactions   Hazelnut (Filbert) Anaphylaxis   Penicillins Anaphylaxis   Coconut (Cocos Nucifera)    Cocos Nucifera    Ozempic (0.25 Or 0.5 Mg-Dose) [Semaglutide(0.25 Or 0.5mg -Dos)] Rash   Rosuvastatin  Other (See Comments)    Dizzy per pt   Tirzepatide Rash

## 2024-02-21 NOTE — Progress Notes (Addendum)
 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  Goal of Therapy:  Heparin  level 0.3-0.5 units/ml   Plan:  Increase heparin  infusion by 1-2 units/kg/hr to 1000 units/hr. Check level in 6 hours.   Marvetta Dauphin, PharmD, BCPS 02/21/2024 12:44 AM     Addendum: Heparin  remains below goal at 0.14, actually lower than previous.  No infusion issues or signs of bleeding per RN.  Increase heparin  infusion by 2 units/kg/hr to 1200 units/hr.  Check level in 6 hours.    VB 6:24 AM

## 2024-02-21 NOTE — Evaluation (Signed)
 Physical Therapy Evaluation Patient Details Name: Hailey [name] MRN: [id number] DOB: 03-15-1965 Today's Date: 02/21/2024  History of Present Illness  59 yo F adm 1/17 with CT (+) L parietal lobe ICH and central venous sinus thrombosis noted to have seizure activity NIH 1 PMH HTN HLD DM TIA migraines  Clinical Impression  Pt admitted secondary to problem above with deficits below. PTA patient worked full-time at [employer] and was independent with all mobility.  Pt currently requires CGA for bed mobility with incr effort and time due to rt sided weakness and impaired balance. Requires +2 min assist for sit to stand and to ambulate 6 feet (limited by numerous lines including EEG).  Anticipate patient will benefit from PT to address problems listed below. Will continue to follow acutely to maximize functional mobility, independence, and safety.  Patient will benefit from continued inpatient follow up therapy, >3 hours/day          If plan is discharge home, recommend the following: Two people to help with walking and/or transfers;Assistance with cooking/housework;Assist for transportation;Help with stairs or ramp for entrance   Can travel by private vehicle        Equipment Recommendations Rolling walker (2 wheels)  Recommendations for Other Services  Rehab consult    Functional Status Assessment Patient has had a recent decline in their functional status and demonstrates the ability to make significant improvements in function in a reasonable and predictable amount of time.     Precautions / Restrictions Precautions Precautions: Fall Recall of Precautions/Restrictions: Intact      Mobility  Bed Mobility Overal bed mobility: Needs Assistance Bed Mobility: Supine to Sit, Sit to Supine     Supine to sit: Contact guard, HOB elevated Sit to supine: Contact guard assist   General bed mobility comments: incr time and effort exit bed to her rt    Transfers Overall transfer level:  Needs assistance Equipment used: 2 person hand held assist Transfers: Sit to/from Stand Sit to Stand: Min assist           General transfer comment: steadying assist due to rt bias    Ambulation/Gait Ambulation/Gait assistance: Min assist, +2 physical assistance, +2 safety/equipment Gait Distance (Feet): 6 Feet Assistive device: 2 person hand held assist Gait Pattern/deviations: Step-to pattern, Decreased step length - right, Decreased dorsiflexion - right, Decreased weight shift to left, Knee hyperextension - right, Steppage, Drifts right/left       General Gait Details: leans to rt; narrow BOS; foot lands flat  Stairs            Wheelchair Mobility     Tilt Bed    Modified Rankin (Stroke Patients Only) Modified Rankin (Stroke Patients Only) Pre-Morbid Rankin Score: No symptoms Modified Rankin: Moderately severe disability     Balance Overall balance assessment: Needs assistance Sitting-balance support: No upper extremity supported, Feet unsupported Sitting balance-Leahy Scale: Good     Standing balance support: Bilateral upper extremity supported Standing balance-Leahy Scale: Poor                               Pertinent Vitals/Pain Pain Assessment Pain Assessment: Faces Faces Pain Scale: Hurts little more Pain Location: head Pain Descriptors / Indicators: Headache Pain Intervention(s): Limited activity within patient's tolerance, Monitored during session, Repositioned    Home Living Family/patient expects to be discharged to:: Private residence Living Arrangements: Spouse/significant other;Other (Comment) (dog (71 lb lab Herlene)) Available Help at Discharge: Family;Available  24 hours/day (short-term) Type of Home: House Home Access: Stairs to enter Entrance Stairs-Rails: Right Entrance Stairs-Number of Steps: 2   Home Layout: One level Home Equipment: Hand held shower head      Prior Function Prior Level of Function :  Independent/Modified Independent;Working/employed;Driving ([employer])                     Extremity/Trunk Assessment   Upper Extremity Assessment Upper Extremity Assessment: Defer to OT evaluation;Right hand dominant    Lower Extremity Assessment Lower Extremity Assessment: RLE deficits/detail RLE Deficits / Details: knee extension 4/5, ankle DF 4/5; RLE Sensation: decreased light touch (tingling) RLE Coordination: decreased gross motor    Cervical / Trunk Assessment Cervical / Trunk Assessment: Other exceptions Cervical / Trunk Exceptions: overweight  Communication   Communication Communication: No apparent difficulties    Cognition Arousal: Lethargic Behavior During Therapy: Flat affect                             Following commands: Intact       Cueing Cueing Techniques: Verbal cues     General Comments General comments (skin integrity, edema, etc.): VSS per ICU monitor    Exercises     Assessment/Plan    PT Assessment Patient needs continued PT services  PT Problem List Decreased strength;Decreased activity tolerance;Decreased balance;Decreased mobility;Decreased knowledge of use of DME;Impaired sensation;Obesity       PT Treatment Interventions DME instruction;Gait training;Stair training;Functional mobility training;Therapeutic activities;Therapeutic exercise;Balance training;Neuromuscular re-education;Patient/family education    PT Goals (Current goals can be found in the Care Plan section)  Acute Rehab PT Goals Patient Stated Goal: return to baseline PT Goal Formulation: With patient Time For Goal Achievement: 03/06/24 Potential to Achieve Goals: Good    Frequency Min 3X/week     Co-evaluation PT/OT/SLP Co-Evaluation/Treatment: Yes Reason for Co-Treatment: Complexity of the patient's impairments (multi-system involvement);For patient/therapist safety;To address functional/ADL transfers PT goals addressed during session:  Mobility/safety with mobility;Balance         AM-PAC PT 6 Clicks Mobility  Outcome Measure Help needed turning from your back to your side while in a flat bed without using bedrails?: A Little Help needed moving from lying on your back to sitting on the side of a flat bed without using bedrails?: A Little Help needed moving to and from a bed to a chair (including a wheelchair)?: Total Help needed standing up from a chair using your arms (e.g., wheelchair or bedside chair)?: Total Help needed to walk in hospital room?: Total Help needed climbing 3-5 steps with a railing? : Total 6 Click Score: 10    End of Session Equipment Utilized During Treatment: Gait belt Activity Tolerance: Patient limited by fatigue Patient left: in bed;with call bell/phone within reach;with bed alarm set;with family/visitor present Nurse Communication: Mobility status PT Visit Diagnosis: Unsteadiness on feet (R26.81);Muscle weakness (generalized) (M62.81);Difficulty in walking, not elsewhere classified (R26.2);Hemiplegia and hemiparesis Hemiplegia - Right/Left: Right Hemiplegia - dominant/non-dominant: Dominant Hemiplegia - caused by: Nontraumatic intracerebral hemorrhage    Time: 1410-1434 PT Time Calculation (min) (ACUTE ONLY): 24 min   Charges:   PT Evaluation $PT Eval Low Complexity: 1 Low   PT General Charges $$ ACUTE PT VISIT: 1 Visit          Macario RAMAN, PT Acute Rehabilitation Services  Office [phone]   Macario SHAUNNA Soja 02/21/2024, 2:55 PM

## 2024-02-21 NOTE — Progress Notes (Signed)
 LTM maint complete - no skin breakdown under:  F8, Fp1, Fp2.

## 2024-02-21 NOTE — Progress Notes (Signed)
 PT Cancellation Note  Patient Details Name: Hailey [name] MRN: [id number] DOB: 1965/09/26   Cancelled Treatment:    Reason Eval/Treat Not Completed: Active bedrest order  Will monitor for appropriateness to proceed with PT eval.    Macario RAMAN, PT Acute Rehabilitation Services  Office [phone]  Macario SHAUNNA Soja 02/21/2024, 6:53 AM

## 2024-02-21 NOTE — Progress Notes (Addendum)
 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  Was noted to have seizure activity before admission and was placed on Keppra    INTERIM HISTORY/SUBJECTIVE  Patient has been hemodynamically stable and afebrile overnight.  Neurological exam is stable with patient only reporting some decreased sensation in right leg.  No further seizure activity noted.  OBJECTIVE  CBC    Component Value Date/Time   WBC 9.5 02/21/2024 0528   RBC 4.61 02/21/2024 0528   HGB 13.1 02/21/2024 0528   HGB 13.7 01/22/2024 1626   HCT 38.3 02/21/2024 0528   HCT 41.9 01/22/2024 1626   PLT 276 02/21/2024 0528   PLT 302 01/22/2024 1626   MCV 83.1 02/21/2024 0528   MCV 87 01/22/2024 1626   MCH 28.4 02/21/2024 0528   MCHC 34.2 02/21/2024 0528   RDW 14.7 02/21/2024 0528   RDW 14.4 01/22/2024 1626   LYMPHSABS 2.6 02/20/2024 1511   MONOABS 0.5 02/20/2024 1511   EOSABS 0.2 02/20/2024 1511   BASOSABS 0.0 02/20/2024 1511    BMET    Component Value Date/Time   NA 136 02/21/2024 0528   NA 141 01/22/2024 1626   K 3.9 02/21/2024 0528   CL 102 02/21/2024 0528   CO2 24 02/21/2024 0528   GLUCOSE 133 (H) 02/21/2024 0528   BUN 13 02/21/2024 0528   BUN 14 01/22/2024 1626   CREATININE 0.63 02/21/2024 0528   CALCIUM  9.2 02/21/2024 0528   EGFR 101 01/22/2024 1626   GFRNONAA >60 02/21/2024 0528    IMAGING past 24 hours    Vitals:   02/21/24 1000 02/21/24 1100 02/21/24 1200 02/21/24 1239  BP: (!) 141/74 129/72 (!) 122/57 113/62  Pulse: 92 91 83 86  Resp:      Temp:   98.2 F (36.8 C)   TempSrc:   Oral   SpO2: 95% 93% 97% 94%  Weight:      Height:         PHYSICAL EXAM General:  Alert, well-nourished, well-developed patient in no acute distress Psych:  Mood and affect appropriate for situation CV: Regular rate and rhythm on monitor Respiratory:  Regular, unlabored respirations on room  air   NEURO:  Mental Status: AA&Ox3, patient is able to give clear and coherent history Speech/Language: speech is without dysarthria or aphasia.    Cranial Nerves:  II: PERRL. Visual fields full.  III, IV, VI: EOMI. Eyelids elevate symmetrically.  V: Sensation is intact to light touch and symmetrical to face.  VII: Face is symmetrical resting and smiling VIII: hearing intact to voice. IX, X: Phonation is normal.  KP:Dynloizm shrug 5/5. XII: tongue is midline without fasciculations. Motor: Able to move all 4 extremities with symmetrical antigravity strength Tone: is normal and bulk is normal Sensation- Intact to light touch bilaterally but diminished in the right lower extremity Coordination: FTN intact bilaterally Gait- deferred  Most Recent NIH  1a Level of Conscious.: 0 1b LOC Questions: 0 1c LOC Commands: 0 2 Best Gaze: 0 3 Visual: 0 4 Facial Palsy: 0 5a Motor Arm - left: 0 5b Motor Arm - Right: 0 6a Motor Leg - Left: 0 6b Motor Leg - Right: 0 7 Limb Ataxia: 0 8 Sensory: 1 9 Best Language: 0 10 Dysarthria: 0 11 Extinct. and Inatten.: 0 TOTAL: 1   ASSESSMENT/PLAN  Hailey [name] is a 59 y.o. female with history of hypertension, hyperlipidemia, diabetes,  TIA and migraines admitted for disorientation and right leg weakness and numbness.  She reports that she was at work and when symptoms started, and she noted right leg twitching at the time of onset of symptoms.  Right leg twitching reoccurred while patient was in the emergency department, and she was given Keppra , Ativan  and Vimpat  with resolution of seizure.  She was placed on LTM EEG.  CT head revealed right parietal lobe ICH with subarachnoid hemorrhage.  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 Code Stroke CT head right parietal lobe ICH with volume of 4.7 mL, aspects  9 CTA head & neck nonopacification of superior sagittal sinus consistent with superior sagittal sinus thrombosis, mild narrowing at the origin of the right vertebral artery CT venogram filling defect involving the superior sagittal sinus to the level of the torcular concerning for dural venous sinus thrombosis MRI stable hemorrhagic infarct in high left posterior parietal lobe with surrounding vasogenic edema and local sulcal effacement, superior sagittal sinus thrombosis MRV Diffuse thrombus confirmed within the superior sagittal sinus.  CT repeat x 2 stable hematoma 2D Echo pending Bilateral lower extremity ultrasound pending LDL 140 HgbA1c 6.2 UDS benzo Hypercoagulable work up pending VTE prophylaxis -fully anticoagulated with heparin  No antithrombotic prior to admission, now on heparin  IV Therapy recommendations:  Pending Disposition:, Likely home  Seizure Patient noted to have right leg twitching with onset of symptoms at work, which repeated when she was in the emergency department Symptoms resolved after administration of lorazepam , Keppra  and Vimpat  LTM EEG 1/17 to 1/18 continuous slowing in the left hemisphere and generalized intermittent slowing, suggestive of cortical dysfunction arising from left hemisphere and no seizures or epileptiform discharges Continue Keppra  1000 mg twice daily If no seizure, will d/c LTM tomorrow  Hypertension Home meds: None Stable Off cleviprex  BP goal < 160 Long term BP goal normotensive  Hyperlipidemia Home meds: None LDL 140, goal < 70 Add lipitor 40 Add statin at discharge  Diabetes type II Controlled Home meds: None HgbA1c 6.2, goal < 7.0 CBGs SSI Recommend close follow-up with PCP for better DM control  Other Stroke Risk Factors Obesity, Body mass index is 41.61 kg/m., BMI >/= 30 associated with increased stroke risk, recommend weight loss, diet and exercise as appropriate  Migraines TIA  Other Active  Problems   Hospital day # 1  Patient seen by NP with MD, MD to edit note as needed. Cortney E Everitt Clint Kill , MSN, AGACNP-BC [facility] See Amion for schedule and pager information 02/21/2024 1:18 PM   ATTENDING NOTE: I reviewed above note and agree with the assessment and plan. Pt was seen and examined.   Husband is at the bedside. Pt lying in bed, stated that HA getting better after fioricet . Moving all extremities, still has some decreased sensation on the RLE. AAO x3. On heparin  IV and keppra . LTM ongoing, no seizure so far. Continue further work up for CVST as above.   For detailed assessment and plan, please refer to above as I have made changes wherever appropriate.   Ary Cummins, MD PhD Stroke Neurology 02/21/2024 1:37 PM  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 This patient's care requires constant monitoring of vital  signs, hemodynamics, respiratory and cardiac monitoring, review of multiple databases, neurological assessment, discussion with family, other specialists and medical decision making of high complexity. I spent 40 minutes of neurocritical care time in the care of this patient. I had long discussion with pt and husband at bedside, updated pt current condition, treatment plan and potential prognosis, and answered all the questions. They expressed understanding and appreciation.      To contact Stroke Continuity provider, please refer to [email or web address]. After hours, contact General Neurology

## 2024-02-21 NOTE — Evaluation (Signed)
 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 Pt currently with functional limitiations due to the deficits listed below (see OT problem list). Pt currently with R side weakness and decreased coordination. Pt demonstrates some hyper extension at the knee with transfer.  Pt will benefit from skilled OT to increase their independence and safety with adls and balance to allow discharge  intensive inpatient follow-up therapy, >3 hours/day . Spouse asking about FL[employer] paperwork for patients job. Please help address for spouse.      If plan is discharge home, recommend the following:   Two people to help with walking and/or transfers;Two people to help with bathing/dressing/bathroom     Functional Status Assessment   Patient has had a recent decline in their functional status and demonstrates the ability to make significant improvements in function in a reasonable and predictable amount of time.     Equipment Recommendations   BSC/3in1     Recommendations for Other Services   Rehab consult;Speech consult;PT consult     Precautions/Restrictions   Precautions Precautions: Fall Recall of Precautions/Restrictions: Intact Precaution/Restrictions Comments: EEG running continuous Restrictions Weight Bearing Restrictions Per Provider Order: No     Mobility Bed Mobility Overal bed mobility: Needs Assistance Bed Mobility: Supine to Sit, Sit to Supine     Supine to sit: Contact guard, HOB elevated Sit to supine: Contact guard assist   General bed mobility comments: incr time and effort exit bed to her rt     Transfers Overall transfer level: Needs assistance Equipment used: 2 person hand held assist Transfers: Sit to/from Stand Sit to Stand: Min assist           General transfer comment: steadying assist due to rt bias      Balance Overall balance assessment: Needs assistance Sitting-balance support: No upper extremity supported, Feet unsupported Sitting balance-Leahy Scale: Good     Standing balance support: Bilateral upper extremity supported Standing balance-Leahy Scale: Poor                             ADL either performed or assessed with clinical judgement   ADL Overall ADL's : Needs assistance/impaired Eating/Feeding: Minimal assistance   Grooming: Minimal assistance   Upper Body Bathing: Moderate assistance   Lower Body Bathing: Maximal assistance   Upper Body Dressing : Moderate assistance   Lower Body Dressing: Maximal assistance                 General ADL Comments: pt needed (A) to thread sock and then pull up especially on the R side     Vision Baseline Vision/History: 1 Wears glasses Ability to See in Adequate Light: 1 Impaired Vision Assessment?: Vision impaired- to be further tested in functional context Additional Comments: pt reports not having glasses during sesison pt scannig appropriately. spouse to bring glasses to have a better accurate assessment next session     Perception         Praxis         Pertinent Vitals/Pain Pain Assessment Pain Assessment: Faces Faces Pain Scale: Hurts little more Pain Location:  head Pain Descriptors / Indicators: Headache Pain Intervention(s): Limited activity within patient's tolerance, Monitored during session     Extremity/Trunk Assessment Upper Extremity Assessment Upper Extremity Assessment: Right hand dominant;RUE deficits/detail RUE Deficits / Details: pt noted to have decrease grasp 3 out 5 pt with decreased fine motor coordination reports its tingling and numb RUE  Sensation: decreased light touch RUE Coordination: decreased fine motor;decreased gross motor   Lower Extremity Assessment Lower Extremity Assessment: Defer to PT evaluation RLE Deficits / Details: knee extension 4/5, ankle DF 4/5; RLE Sensation:  (tingling) RLE Coordination: decreased gross motor   Cervical / Trunk Assessment Cervical / Trunk Assessment: Other exceptions Cervical / Trunk Exceptions: overweight   Communication Communication Communication: No apparent difficulties   Cognition Arousal: Lethargic Behavior During Therapy: Flat affect Cognition: No apparent impairments (increased time to answer/ delay recall but able to complete with time)                               Following commands: Intact       Cueing  General Comments   Cueing Techniques: Verbal cues  VSS per ICU monitor with BP 120s/70s   Exercises     Shoulder Instructions      Home Living Family/patient expects to be discharged to:: Private residence Living Arrangements: Spouse/significant other;Other (Comment) (dog (71 lb lab Herlene)) Available Help at Discharge: Family;Available 24 hours/day (short-term) Type of Home: House Home Access: Stairs to enter [employer] of Steps: 2 Entrance Stairs-Rails: Right Home Layout: One level     Bathroom Shower/Tub: Producer, Television/film/video: Standard (vanity beside)     Home Equipment: Hand held shower head   Additional Comments: has a Programme Researcher, Broadcasting/film/video,      Prior Functioning/Environment Prior Level of Function : Independent/Modified Independent;Working/employed;Driving ([employer])             Mobility Comments: works at [employer] List: Decreased strength;Decreased activity tolerance;Impaired balance (sitting and/or standing);Decreased safety awareness;Decreased knowledge of use of DME or AE;Cardiopulmonary status limiting activity;Decreased knowledge of precautions;Impaired sensation   OT  Treatment/Interventions: Self-care/ADL training;Therapeutic exercise;DME and/or AE instruction;Therapeutic activities;Patient/family education;Balance training;Manual therapy;Neuromuscular education      OT Goals(Current goals can be found in the care plan section)   Acute Rehab OT Goals Patient Stated Goal: to get some rest OT Goal Formulation: With patient/family Time For Goal Achievement: 03/06/24 Potential to Achieve Goals: Good   OT Frequency:  Min 2X/week    Co-evaluation PT/OT/SLP Co-Evaluation/Treatment: Yes Reason for Co-Treatment: Complexity of the patient's impairments (multi-system involvement);For patient/therapist safety;To address functional/ADL transfers PT goals addressed during session: Mobility/safety with mobility;Balance OT goals addressed during session: ADL's and self-care;Strengthening/ROM      AM-PAC OT 6 Clicks Daily Activity     Outcome Measure Help from another person eating meals?: A Little Help from another person taking care of personal grooming?: A Little Help from another person toileting, which includes using toliet, bedpan, or urinal?: A Lot Help from another person bathing (including washing, rinsing, drying)?: A Lot Help from another person to put on and taking off regular upper body clothing?: A Lot Help from another person to put on and taking off regular lower body clothing?: A Lot 6 Click Score: 14   End of Session Equipment Utilized During Treatment: Gait belt Nurse Communication: Mobility status;Precautions  Activity Tolerance: Patient tolerated treatment well Patient left: in bed;with call bell/phone within  reach;with bed alarm set;with family/visitor present  OT Visit Diagnosis: Unsteadiness on feet (R26.81);Muscle weakness (generalized) (M62.81)                Time: 1410-1435 OT Time Calculation (min): 25 min Charges:  OT General Charges $OT Visit: 1 Visit OT Evaluation $OT Eval Moderate Complexity: 1 Mod   Brynn, OTR/L   Acute Rehabilitation Services Office: [phone] .   Ely Molt 02/21/2024, 3:27 PM

## 2024-02-21 NOTE — Progress Notes (Signed)
 Inpatient Rehab Admissions Coordinator Note:   Per therapy patient was screened for CIR candidacy by Nysha Koplin SHAUNNA Yvone Cohens, CCC-SLP. At this time, pt appears to be a potential candidate for CIR. I will place an order for rehab consult for full assessment, per our protocol.  Please contact me any with questions.SABRA Tinnie Yvone Cohens, MS, CCC-SLP Admissions Coordinator [phone] 02/21/24 4:36 PM

## 2024-02-22 ENCOUNTER — Inpatient Hospital Stay (HOSPITAL_COMMUNITY)

## 2024-02-22 DIAGNOSIS — E669 Obesity, unspecified: Secondary | ICD-10-CM | POA: Diagnosis not present

## 2024-02-22 DIAGNOSIS — I676 Nonpyogenic thrombosis of intracranial venous system: Secondary | ICD-10-CM

## 2024-02-22 DIAGNOSIS — E119 Type 2 diabetes mellitus without complications: Secondary | ICD-10-CM | POA: Diagnosis not present

## 2024-02-22 DIAGNOSIS — I10 Essential (primary) hypertension: Secondary | ICD-10-CM | POA: Diagnosis not present

## 2024-02-22 DIAGNOSIS — I611 Nontraumatic intracerebral hemorrhage in hemisphere, cortical: Secondary | ICD-10-CM | POA: Diagnosis not present

## 2024-02-22 DIAGNOSIS — Z6841 Body Mass Index (BMI) 40.0 and over, adult: Secondary | ICD-10-CM | POA: Diagnosis not present

## 2024-02-22 DIAGNOSIS — R569 Unspecified convulsions: Secondary | ICD-10-CM | POA: Diagnosis not present

## 2024-02-22 DIAGNOSIS — E785 Hyperlipidemia, unspecified: Secondary | ICD-10-CM | POA: Diagnosis not present

## 2024-02-22 DIAGNOSIS — G08 Intracranial and intraspinal phlebitis and thrombophlebitis: Secondary | ICD-10-CM | POA: Diagnosis not present

## 2024-02-22 LAB — COMPREHENSIVE METABOLIC PANEL WITH GFR
ALT: 21 U/L (ref 0–44)
AST: 18 U/L (ref 15–41)
Albumin: 3.6 g/dL (ref 3.5–5.0)
Alkaline Phosphatase: 66 U/L (ref 38–126)
Anion gap: 11 (ref 5–15)
BUN: 12 mg/dL (ref 6–20)
CO2: 23 mmol/L (ref 22–32)
Calcium: 9.1 mg/dL (ref 8.9–10.3)
Chloride: 102 mmol/L (ref 98–111)
Creatinine, Ser: 0.66 mg/dL (ref 0.44–1.00)
GFR, Estimated: >60 mL/min
Glucose, Bld: 119 mg/dL — ABNORMAL HIGH (ref 70–99)
Potassium: 3.9 mmol/L (ref 3.5–5.1)
Sodium: 135 mmol/L (ref 135–145)
Total Bilirubin: 0.5 mg/dL (ref 0.0–1.2)
Total Protein: 6.4 g/dL — ABNORMAL LOW (ref 6.5–8.1)

## 2024-02-22 LAB — CBC
HCT: 37.7 % (ref 36.0–46.0)
Hemoglobin: 12.7 g/dL (ref 12.0–15.0)
MCH: 28.2 pg (ref 26.0–34.0)
MCHC: 33.7 g/dL (ref 30.0–36.0)
MCV: 83.8 fL (ref 80.0–100.0)
Platelets: 254 K/uL (ref 150–400)
RBC: 4.5 MIL/uL (ref 3.87–5.11)
RDW: 14.5 % (ref 11.5–15.5)
WBC: 8.3 K/uL (ref 4.0–10.5)
nRBC: 0 % (ref 0.0–0.2)

## 2024-02-22 LAB — PROTEIN C ACTIVITY: Protein C Activity: 110 % (ref 73–180)

## 2024-02-22 LAB — GLUCOSE, CAPILLARY
Glucose-Capillary: 123 mg/dL — ABNORMAL HIGH (ref 70–99)
Glucose-Capillary: 137 mg/dL — ABNORMAL HIGH (ref 70–99)
Glucose-Capillary: 159 mg/dL — ABNORMAL HIGH (ref 70–99)
Glucose-Capillary: 118 mg/dL — ABNORMAL HIGH (ref 70–99)
Glucose-Capillary: 147 mg/dL — ABNORMAL HIGH (ref 70–99)

## 2024-02-22 LAB — LUPUS ANTICOAGULANT PANEL
DRVVT: 27.4 s (ref 0.0–47.0)
PTT Lupus Anticoagulant: 35.9 s (ref 0.0–43.5)

## 2024-02-22 LAB — ECHOCARDIOGRAM COMPLETE
Area-P 1/2: 4.71 cm2
Calc EF: 62.7 %
Height: 61 in
S' Lateral: 3.2 cm
Single Plane A2C EF: 67.9 %
Single Plane A4C EF: 59.7 %
Weight: 3523.83 [oz_av]

## 2024-02-22 LAB — HEPARIN LEVEL (UNFRACTIONATED): Heparin Unfractionated: 0.31 [IU]/mL (ref 0.30–0.70)

## 2024-02-22 LAB — HOMOCYSTEINE: Homocysteine: 9.3 umol/L (ref 0.0–14.5)

## 2024-02-22 LAB — PROTEIN S, TOTAL: Protein S Ag, Total: 142 % (ref 60–150)

## 2024-02-22 LAB — ANA W/REFLEX IF POSITIVE: Anti Nuclear Antibody (ANA): NEGATIVE

## 2024-02-22 LAB — PROTEIN S ACTIVITY: Protein S Activity: 109 % (ref 63–140)

## 2024-02-22 MED ORDER — GABAPENTIN 100 MG PO CAPS
100.0000 mg | ORAL_CAPSULE | Freq: Three times a day (TID) | ORAL | Status: DC
  Administered 2024-02-22 – 2024-02-25 (×10): 100 mg via ORAL
  Filled 2024-02-22 (×10): qty 1

## 2024-02-22 MED ORDER — ONDANSETRON HCL 4 MG/2ML IJ SOLN
4.0000 mg | Freq: Four times a day (QID) | INTRAMUSCULAR | Status: DC | PRN
  Administered 2024-02-22: 4 mg via INTRAVENOUS
  Filled 2024-02-22: qty 2

## 2024-02-22 NOTE — Progress Notes (Signed)
 PHARMACY - ANTICOAGULATION CONSULT NOTE  Pharmacy Consult for Heparin  Indication: superior sagittal sinus thrombosis  Allergies[1]  Patient Measurements: Height: 5' 1 (154.9 cm) Weight: 99.9 kg (220 lb 3.8 oz) IBW/kg (Calculated) : 47.8 HEPARIN  DW (KG): 71.8  Vital Signs: Temp: 98.6 F (37 C) (01/19 0800) Temp Source: Oral (01/19 0800) BP: 130/69 (01/19 0814) Pulse Rate: 85 (01/19 0814)  Labs: Recent Labs    02/20/24 1511 02/20/24 1514 02/21/24 0013 02/21/24 0528 02/21/24 1153 02/21/24 1839 02/22/24 0455  HGB 13.8 14.3  --  13.1  --   --  12.7  HCT 42.1 42.0  --  38.3  --   --  37.7  PLT 271  --   --  276  --   --  254  APTT 29  --   --   --   --   --   --   LABPROT 12.4  --   --   --   --   --   --   INR 0.9  --   --   --   --   --   --   HEPARINUNFRC  --   --    < > 0.14* 0.33 0.33 0.31  CREATININE 0.70 0.70  --  0.63  --   --  0.66   < > = values in this interval not displayed.    Estimated Creatinine Clearance: 82 mL/min (by C-G formula based on SCr of 0.66 mg/dL).   Medical History: Past Medical History:  Diagnosis Date   Angina at rest    Anxiety    Chest pain    Diabetes mellitus without complication (HCC)    High cholesterol    High cholesterol    History of angina    History of TIA (transient ischemic attack) 10/11/2020   Hypercholesterolemia 10/11/2020   Hyperlipidemia    Migraine    Personal history of COVID-19 10/11/2020   Radiculopathy of cervical region    TIA (transient ischemic attack)    Transient cerebral ischemic attack    Type II diabetes mellitus (HCC) 10/11/2020     Assessment: 59 yo female presents with L Parietal hemorrhage/SAH, partially occlusive superior saggital sinus thrombosis. Pharmacy consulted to dose heparin  neuro protocol.  HL 0.31 - therapeutic , on low end Hgb 12.7, Plt 254 - stable/WNL  Goal of Therapy:  Heparin  level 0.3-0.5 units/ml Monitor platelets by anticoagulation protocol: Yes   Plan:  Increase  heparin  slightly to 1250 units/hr to maintain therapeutic range Monitor daily heparin  level, CBC, signs/symptoms of bleeding    Sharyne Glatter, PharmD, BCCCP Critical Care Clinical Pharmacist 02/22/2024 8:41 AM     [1]  Allergies Allergen Reactions   Hazelnut (Filbert) Anaphylaxis   Penicillins Anaphylaxis   Coconut (Cocos Nucifera)    Cocos Nucifera    Ozempic (0.25 Or 0.5 Mg-Dose) [Semaglutide(0.25 Or 0.5mg -Dos)] Rash   Rosuvastatin  Other (See Comments)    Dizzy per pt   Tirzepatide Rash

## 2024-02-22 NOTE — Evaluation (Signed)
 Speech Language Pathology Evaluation Patient Details Name: Hailey [name] MRN: [id number] DOB: 1966/01/28 Today's Date: 02/22/2024 Time: 8570-8559 SLP Time Calculation (min) (ACUTE ONLY): 11 min  Problem List:  Patient Active Problem List   Diagnosis Date Noted   ICH (intracerebral hemorrhage) (HCC) 02/20/2024   Tear of gluteus minimus tendon, right, initial encounter 05/09/2021   Degenerative disc disease, lumbar 01/29/2021   Degenerative disc disease, cervical 01/29/2021   Somatic dysfunction of spine, cervical 01/29/2021   History of TIA (transient ischemic attack) 10/11/2020   Hypercholesterolemia 10/11/2020   Type II diabetes mellitus (HCC) 10/11/2020   Personal history of COVID-19 10/11/2020   Past Medical History:  Past Medical History:  Diagnosis Date   Angina at rest    Anxiety    Chest pain    Diabetes mellitus without complication (HCC)    High cholesterol    High cholesterol    History of angina    History of TIA (transient ischemic attack) 10/11/2020   Hypercholesterolemia 10/11/2020   Hyperlipidemia    Migraine    Personal history of COVID-19 10/11/2020   Radiculopathy of cervical region    TIA (transient ischemic attack)    Transient cerebral ischemic attack    Type II diabetes mellitus (HCC) 10/11/2020   Past Surgical History:  Past Surgical History:  Procedure Laterality Date   BUNIONECTOMY     CHOLECYSTECTOMY     HPI:  59 yo female admitted 1/17 with L parietal ICH, central venous sinus thrombosis, and seizure activity. NIH 1. PMH includes HTN, HLD, DM, TIA, migraines   Assessment / Plan / Recommendation Clinical Impression  Pt reports she was fully independent and working PTA. She now presents with deficits primarily related to auditory comprehension, memory, and problem solving evidenced by subsections of the Cognistat. She consistently follows one step commands but has increased difficulty when complexity increases to two steps or contains  temporal elements. She could not recall any of the four novel words but accuracy increased to 3/4 when given multiple choices. She had significant difficulty with calculations even when given paper/pen and cueing for problem solving. Given prior level of independence, recommend intensive SLP f/u.    SLP Assessment  SLP Recommendation/Assessment: Patient needs continued Speech Language Pathology Services SLP Visit Diagnosis: Cognitive communication deficit (R41.841)     Assistance Recommended at Discharge  Frequent or constant Supervision/Assistance  Functional Status Assessment Patient has had a recent decline in their functional status and demonstrates the ability to make significant improvements in function in a reasonable and predictable amount of time.  Frequency and Duration min 2x/week  2 weeks      SLP Evaluation Cognition  Overall Cognitive Status: Impaired/Different from baseline Arousal/Alertness: Awake/alert Orientation Level: Oriented X4 Attention: Sustained Sustained Attention: Appears intact Memory: Impaired Memory Impairment: Retrieval deficit Awareness: Impaired Awareness Impairment: Emergent impairment Problem Solving: Impaired Problem Solving Impairment: Verbal basic Executive Function: Reasoning Reasoning: Appears intact       Comprehension  Auditory Comprehension Overall Auditory Comprehension: Impaired Yes/No Questions: Not tested Commands: Impaired One Step Basic Commands: 75-100% accurate Two Step Basic Commands: 25-49% accurate    Expression Expression Primary Mode of Expression: Verbal Verbal Expression Overall Verbal Expression: Appears within functional limits for tasks assessed   Oral / Motor  Oral Motor/Sensory Function Overall Oral Motor/Sensory Function: Within functional limits Motor Speech Overall Motor Speech: Appears within functional limits for tasks assessed            Damien Blumenthal, M.A., CCC-SLP Speech Language Pathology, Acute  Rehabilitation Services  Secure Chat preferred [phone]  02/22/2024, 3:20 PM

## 2024-02-22 NOTE — Progress Notes (Signed)
 Physical Therapy Treatment Patient Details Name: Hailey [name] MRN: [id number] DOB: July 30, 1965 Today's Date: 02/22/2024   History of Present Illness 59 yo F adm 1/17 with CT (+) L parietal lobe ICH and central venous sinus thrombosis noted to have seizure activity NIH 1 PMH HTN HLD DM TIA migraines    PT Comments  Pt required CGA bed mobility, min assist sit to stand with RW, and mod assist amb 12' with RW. Unsteady gait. Assist required to maintain R grip on RW. Pt in recliner with feet elevated at end of session.     If plan is discharge home, recommend the following: Two people to help with walking and/or transfers;Assistance with cooking/housework;Assist for transportation;Help with stairs or ramp for entrance   Can travel by private vehicle        Equipment Recommendations  Rolling walker (2 wheels)    Recommendations for Other Services       Precautions / Restrictions Precautions Precautions: Fall Recall of Precautions/Restrictions: Intact     Mobility  Bed Mobility Overal bed mobility: Needs Assistance Bed Mobility: Supine to Sit     Supine to sit: Contact guard, HOB elevated     General bed mobility comments: increased time and effort    Transfers Overall transfer level: Needs assistance Equipment used: Rolling walker (2 wheels) Transfers: Sit to/from Stand Sit to Stand: Min assist           General transfer comment: assist to power up. Assist with R hand placement on RW.    Ambulation/Gait Ambulation/Gait assistance: Mod assist Gait Distance (Feet): 12 Feet Assistive device: Rolling walker (2 wheels) Gait Pattern/deviations: Step-through pattern, Decreased step length - right, Steppage, Narrow base of support Gait velocity: decreased Gait velocity interpretation: <1.8 ft/sec, indicate of risk for recurrent falls   General Gait Details: unsteady gait. Assist to maintain balance. Assist to maintain R grip on RW.   Stairs              Wheelchair Mobility     Tilt Bed    Modified Rankin (Stroke Patients Only) Modified Rankin (Stroke Patients Only) Pre-Morbid Rankin Score: No symptoms Modified Rankin: Moderately severe disability     Balance Overall balance assessment: Needs assistance Sitting-balance support: No upper extremity supported, Feet supported Sitting balance-Leahy Scale: Good     Standing balance support: Bilateral upper extremity supported, During functional activity, Reliant on assistive device for balance Standing balance-Leahy Scale: Poor                              Communication Communication Communication: No apparent difficulties  Cognition Arousal: Alert Behavior During Therapy: Flat affect   PT - Cognitive impairments: Problem solving, Safety/Judgement                         Following commands: Intact      Cueing Cueing Techniques: Verbal cues  Exercises      General Comments General comments (skin integrity, edema, etc.): VSS on RA. BP 135/72      Pertinent Vitals/Pain Pain Assessment Pain Assessment: Faces Faces Pain Scale: Hurts a little bit Pain Location: head Pain Descriptors / Indicators: Headache Pain Intervention(s): Premedicated before session, Monitored during session, Repositioned    Home Living                          Prior Function  PT Goals (current goals can now be found in the care plan section) Acute Rehab PT Goals Patient Stated Goal: return to baseline Progress towards PT goals: Progressing toward goals    Frequency    Min 3X/week      PT Plan      Co-evaluation              AM-PAC PT 6 Clicks Mobility   Outcome Measure  Help needed turning from your back to your side while in a flat bed without using bedrails?: A Little Help needed moving from lying on your back to sitting on the side of a flat bed without using bedrails?: A Little Help needed moving to and from a bed to a  chair (including a wheelchair)?: A Lot Help needed standing up from a chair using your arms (e.g., wheelchair or bedside chair)?: A Little Help needed to walk in hospital room?: A Lot Help needed climbing 3-5 steps with a railing? : Total 6 Click Score: 14    End of Session Equipment Utilized During Treatment: Gait belt Activity Tolerance: Patient tolerated treatment well Patient left: in chair;with call bell/phone within reach;with chair alarm set;with family/visitor present Nurse Communication: Mobility status PT Visit Diagnosis: Unsteadiness on feet (R26.81);Muscle weakness (generalized) (M62.81);Difficulty in walking, not elsewhere classified (R26.2);Hemiplegia and hemiparesis Hemiplegia - Right/Left: Right Hemiplegia - dominant/non-dominant: Dominant Hemiplegia - caused by: Nontraumatic intracerebral hemorrhage     Time: 8985-8958 PT Time Calculation (min) (ACUTE ONLY): 27 min  Charges:    $Gait Training: 23-37 mins PT General Charges $$ ACUTE PT VISIT: 1 Visit                     Sari MATSU., PT  Office # [phone]    Erven Sari Shaker 02/22/2024, 12:03 PM

## 2024-02-22 NOTE — Progress Notes (Signed)
 Notified Dr. Jerri that patient is having weakness in right arm that was not reported to this RN nor documented prior. MD coming to bedside to assess patient.

## 2024-02-22 NOTE — Procedures (Addendum)
 Patient Name: Hailey [name]  MRN: [id number]  Epilepsy Attending: Arlin MALVA Krebs  Referring Physician/Provider: Judithe Rocky BROCKS, NP  Duration: 02/21/2024 1758 to 02/22/2024 1002   Patient history: 59 y.o. female p/w headache x 2-3 days, who had R leg tingling/numbness and twitching at work. EEG to evaluate for seizure   Level of alertness: Awake, asleep   AEDs during EEG study: LEV   Technical aspects: This EEG study was done with scalp electrodes positioned according to the 10-20 International system of electrode placement. Electrical activity was reviewed with band pass filter of 1-70Hz , sensitivity of 7 uV/mm, display speed of 25mm/sec with a 60Hz  notched filter applied as appropriate. EEG data were recorded continuously and digitally stored.  Video monitoring was available and reviewed as appropriate.   Description: The posterior dominant rhythm consists of 9 Hz activity of moderate voltage (25-35 uV) seen predominantly in posterior head regions, symmetric and reactive to eye opening and eye closing. Sleep was characterized by vertex waves, sleep spindles (12 to 14 Hz), maximal frontocentral region. There is intermittent left parietal 3 to 6 Hz theta-delta slowing. Hyperventilation and photic stimulation were not performed.      ABNORMALITY - Intermittent slow, left parietal    IMPRESSION: This study is suggestive of cortical dysfunction arising from left parietal region likely secondary to underlying structural abnormality. No seizures or epileptiform discharges were seen throughout the recording.   Emanuel Dowson O Maryem Shuffler

## 2024-02-22 NOTE — Progress Notes (Signed)
 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  Was noted to have seizure activity before admission and was placed on Keppra   1/19: mild R sided weakness, LTM EEG neg  INTERIM HISTORY/SUBJECTIVE Husband is at the bedside. RN reported some weakness on the right. Per husband, Hailey [name] strength no significant change from yesterday but much improved from presentation to ED  OBJECTIVE  CBC    Component Value Date/Time   WBC 8.3 02/22/2024 0455   RBC 4.50 02/22/2024 0455   HGB 12.7 02/22/2024 0455   HGB 13.7 01/22/2024 1626   HCT 37.7 02/22/2024 0455   HCT 41.9 01/22/2024 1626   PLT 254 02/22/2024 0455   PLT 302 01/22/2024 1626   MCV 83.8 02/22/2024 0455   MCV 87 01/22/2024 1626   MCH 28.2 02/22/2024 0455   MCHC 33.7 02/22/2024 0455   RDW 14.5 02/22/2024 0455   RDW 14.4 01/22/2024 1626   LYMPHSABS 2.6 02/20/2024 1511   MONOABS 0.5 02/20/2024 1511   EOSABS 0.2 02/20/2024 1511   BASOSABS 0.0 02/20/2024 1511    BMET    Component Value Date/Time   NA 135 02/22/2024 0455   NA 141 01/22/2024 1626   K 3.9 02/22/2024 0455   CL 102 02/22/2024 0455   CO2 23 02/22/2024 0455   GLUCOSE 119 (H) 02/22/2024 0455   BUN 12 02/22/2024 0455   BUN 14 01/22/2024 1626   CREATININE 0.66 02/22/2024 0455   CALCIUM  9.1 02/22/2024 0455   EGFR 101 01/22/2024 1626   GFRNONAA >60 02/22/2024 0455    IMAGING past 24 hours    Vitals:   02/22/24 1135 02/22/24 1200 02/22/24 1300 02/22/24 1330  BP: 95/72 115/84    Pulse: 83 83 76 90  Resp:      Temp: 97.7 F (36.5 C)     TempSrc: Oral     SpO2: 92% 95% 95% 95%  Weight:      Height:         PHYSICAL EXAM General:  Alert, well-nourished, well-developed patient in no acute distress Psych:  Mood and affect appropriate for situation CV: Regular rate and rhythm on monitor Respiratory:  Regular, unlabored respirations on room  air   NEURO:  Mental Status: AA&Ox3, patient is able to give clear and coherent history Speech/Language: speech is without dysarthria or aphasia.    Cranial Nerves:  II: PERRL. Visual fields full.  III, IV, VI: EOMI. Eyelids elevate symmetrically.  V: Sensation is intact to light touch and symmetrical to face.  VII: Face is symmetrical resting and smiling VIII: hearing intact to voice. IX, X: Phonation is normal.  KP:Dynloizm shrug 5/5. XII: tongue is midline without fasciculations. Motor: Able to move all 4 extremities against gravity, RUE 4/5 proximal and distal. RLE 5/5 proximal and 4/5 distally. LUE and LLE 5/5 Tone: is normal and bulk is normal Sensation- Intact to light touch bilaterally but diminished in the right lower extremity Coordination: FTN intact bilaterally Gait- deferred   ASSESSMENT/PLAN  Ms. Hailey [name] is a 59 y.o. female with history of hypertension, hyperlipidemia, diabetes, TIA and migraines admitted for disorientation and right leg weakness and numbness.  She reports that she was at work and when symptoms started, and she noted right leg twitching at the time of onset of symptoms.  Right leg twitching reoccurred while patient was in the emergency department, and she was given Keppra , Ativan  and Vimpat   with resolution of seizure.  She was placed on LTM EEG.  CT head revealed right parietal lobe ICH with subarachnoid hemorrhage.  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 Code Stroke CT head right parietal lobe ICH with volume of 4.7 mL, aspects 9 CTA head & neck nonopacification of superior sagittal sinus consistent with superior sagittal sinus thrombosis, mild narrowing at the origin of the right vertebral artery CT venogram filling defect involving the superior sagittal sinus to the level of the torcular concerning for  dural venous sinus thrombosis MRI stable hemorrhagic infarct in high left posterior parietal lobe with surrounding vasogenic edema and local sulcal effacement, superior sagittal sinus thrombosis MRV Diffuse thrombus confirmed within the superior sagittal sinus.  CT repeat x 3 stable hematoma 2D Echo pending Bilateral lower extremity ultrasound no DVT LDL 140 HgbA1c 6.2 UDS benzo Hypercoagulable work up pending VTE prophylaxis -fully anticoagulated with heparin  No antithrombotic prior to admission, now on heparin  IV Therapy recommendations:  CIR Disposition:, Likely home  Seizure Patient noted to have right leg twitching with onset of symptoms at work, which repeated when she was in the emergency department Symptoms resolved after administration of lorazepam , Keppra  and Vimpat  LTM EEG 1/17 to 1/18 continuous slowing in the left hemisphere and generalized intermittent slowing, suggestive of cortical dysfunction arising from left hemisphere and no seizures or epileptiform discharges Continue Keppra  1000 mg twice daily LTM EEG no seizure, d/c'ed  Hypertension Home meds: None Stable Off cleviprex  BP goal < 160 Long term BP goal normotensive  Hyperlipidemia Home meds: None LDL 140, goal < 70 Add lipitor 40 Add statin at discharge  Diabetes type II Controlled Home meds: None HgbA1c 6.2, goal < 7.0 CBGs SSI Recommend close follow-up with PCP for better DM control  Other Stroke Risk Factors Obesity, Body mass index is 41.61 kg/m., BMI >/= 30 associated with increased stroke risk, recommend weight loss, diet and exercise as appropriate  Migraines TIA  Other Active Problems   Hospital day # 2  Ary Cummins, MD PhD Stroke Neurology 02/22/2024 1:56 PM  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 This patient's care  requires constant monitoring of vital signs, hemodynamics, respiratory and cardiac monitoring, review of multiple databases, neurological assessment, discussion with family, other specialists and medical decision making of high complexity. I spent 35 minutes of neurocritical care time in the care of this patient. I had long discussion with Hailey [name] and husband at bedside, updated Hailey [name] current condition, treatment plan and potential prognosis, and answered all the questions. They expressed understanding and appreciation.      To contact Stroke Continuity provider, please refer to [email or web address]. After hours, contact General Neurology

## 2024-02-22 NOTE — TOC Initial Note (Signed)
 Transition of Care [facility]) - Initial/Assessment Note    Patient Details  Name: Hailey [name] MRN: [id number] Date of Birth: 19-Mar-1965  [facility] CM/SW Contact:    Inocente GORMAN Kindle, LCSW Phone Number: 02/22/2024, 4:37 PM  Clinical Narrative:                 Patient admitted from home with spouse undergoing ICH workup. ICM following for CIR determination of insurance approval and candidacy.    Expected Discharge Plan: IP Rehab Facility Barriers to Discharge: English As A Second Language Teacher, Continued Medical Work up   Patient Goals and CMS Choice Patient states their goals for this hospitalization and ongoing recovery are:: Rehab          Expected Discharge Plan and Services     Post Acute Care Choice: IP Rehab Living arrangements for the past 2 months: Single Family Home                                      Prior Living Arrangements/Services Living arrangements for the past 2 months: Single Family Home Lives with:: Spouse Patient language and need for interpreter reviewed:: Yes Do you feel safe going back to the place where you live?: Yes      Need for Family Participation in Patient Care: Yes (Comment) Care giver support system in place?: Yes (comment)   Criminal Activity/Legal Involvement Pertinent to Current Situation/Hospitalization: No - Comment as needed  Activities of Daily Living      Permission Sought/Granted Permission sought to share information with : Facility Medical Sales Representative, Family Supports Permission granted to share information with : Yes, Verbal Permission Granted  Share Information with NAME: Allene, Furuya   [phone]           Emotional Assessment Appearance:: Appears stated age     Orientation: : Oriented to Self, Oriented to Place, Oriented to  Time, Oriented to Situation Alcohol / Substance Use: Not Applicable Psych Involvement: No (comment)  Admission diagnosis:  ICH (intracerebral hemorrhage) (HCC)  [I61.9] Intraparenchymal hemorrhage of brain (HCC) [I61.9] Patient Active Problem List   Diagnosis Date Noted   ICH (intracerebral hemorrhage) (HCC) 02/20/2024   Tear of gluteus minimus tendon, right, initial encounter 05/09/2021   Degenerative disc disease, lumbar 01/29/2021   Degenerative disc disease, cervical 01/29/2021   Somatic dysfunction of spine, cervical 01/29/2021   History of TIA (transient ischemic attack) 10/11/2020   Hypercholesterolemia 10/11/2020   Type II diabetes mellitus (HCC) 10/11/2020   Personal history of COVID-19 10/11/2020   PCP:  Kip Righter, MD Pharmacy:   [facility] [address] [place] - [address] [place] [address] Phone: [phone] Fax: [phone]     Social Drivers of Health (SDOH) Social History: SDOH Screenings   Food Insecurity: No Food Insecurity (02/22/2024)  Housing: Low Risk (02/22/2024)  Transportation Needs: No Transportation Needs (02/22/2024)  Utilities: Not At Risk (02/22/2024)  Tobacco Use: Low Risk (01/22/2024)   SDOH Interventions:     Readmission Risk Interventions     No data to display

## 2024-02-22 NOTE — Progress Notes (Cosign Needed Addendum)
 LTM EEG disconnected - no skin breakdown at [facility]. [facility] notified.

## 2024-02-22 NOTE — Plan of Care (Signed)
" °  Problem: Education: Goal: Knowledge of disease or condition will improve Outcome: Progressing Goal: Knowledge of secondary prevention will improve (MUST DOCUMENT ALL) Outcome: Progressing Goal: Knowledge of patient specific risk factors will improve (DELETE if not current risk factor) Outcome: Progressing   Problem: Coping: Goal: Will verbalize positive feelings about self Outcome: Progressing Goal: Will identify appropriate support needs Outcome: Progressing   Problem: Health Behavior/Discharge Planning: Goal: Goals will be collaboratively established with patient/family Outcome: Progressing   Problem: Self-Care: Goal: Ability to participate in self-care as condition permits will improve Outcome: Progressing   Problem: Nutrition: Goal: Risk of aspiration will decrease Outcome: Progressing Goal: Dietary intake will improve Outcome: Progressing   "

## 2024-02-22 NOTE — Progress Notes (Signed)
 BLE venous duplex has been completed.  Results can be found under chart review under CV PROC. 02/22/2024 12:06 PM Kathleen Tamm RVT, RDMS

## 2024-02-22 NOTE — Progress Notes (Signed)
 Inpatient Rehab Coordinator Note:  I met with patient and her spouse at bedside to discuss CIR recommendations and goals/expectations of CIR stay.  We reviewed 3 hrs/day of therapy, physician follow up, and average length of stay 2 weeks (dependent upon progress) with goals of 24/7 supervision to min assist.  Spouse does work during the day but agrees she would not be safe alone initially.  We talked about options for coverage during that time, and that insurance could cover [facility] therapies but not caregivers so would need to identify who that would be.  We discussed insurance auth process and I will start that today.  Will follow.   Reche Lowers, PT, DPT Admissions Coordinator [phone] 02/22/24 1:16 PM

## 2024-02-23 ENCOUNTER — Inpatient Hospital Stay (HOSPITAL_COMMUNITY)

## 2024-02-23 DIAGNOSIS — E785 Hyperlipidemia, unspecified: Secondary | ICD-10-CM | POA: Diagnosis not present

## 2024-02-23 DIAGNOSIS — I10 Essential (primary) hypertension: Secondary | ICD-10-CM | POA: Diagnosis not present

## 2024-02-23 DIAGNOSIS — E119 Type 2 diabetes mellitus without complications: Secondary | ICD-10-CM | POA: Diagnosis not present

## 2024-02-23 DIAGNOSIS — Z6841 Body Mass Index (BMI) 40.0 and over, adult: Secondary | ICD-10-CM | POA: Diagnosis not present

## 2024-02-23 DIAGNOSIS — I611 Nontraumatic intracerebral hemorrhage in hemisphere, cortical: Secondary | ICD-10-CM | POA: Diagnosis not present

## 2024-02-23 DIAGNOSIS — E669 Obesity, unspecified: Secondary | ICD-10-CM | POA: Diagnosis not present

## 2024-02-23 DIAGNOSIS — R569 Unspecified convulsions: Secondary | ICD-10-CM | POA: Diagnosis not present

## 2024-02-23 DIAGNOSIS — G08 Intracranial and intraspinal phlebitis and thrombophlebitis: Secondary | ICD-10-CM | POA: Diagnosis not present

## 2024-02-23 LAB — GLUCOSE, CAPILLARY
Glucose-Capillary: 108 mg/dL — ABNORMAL HIGH (ref 70–99)
Glucose-Capillary: 121 mg/dL — ABNORMAL HIGH (ref 70–99)
Glucose-Capillary: 101 mg/dL — ABNORMAL HIGH (ref 70–99)
Glucose-Capillary: 195 mg/dL — ABNORMAL HIGH (ref 70–99)

## 2024-02-23 LAB — COMPREHENSIVE METABOLIC PANEL WITH GFR
ALT: 20 U/L (ref 0–44)
AST: 13 U/L — ABNORMAL LOW (ref 15–41)
Albumin: 3.6 g/dL (ref 3.5–5.0)
Alkaline Phosphatase: 64 U/L (ref 38–126)
Anion gap: 10 (ref 5–15)
BUN: 12 mg/dL (ref 6–20)
CO2: 27 mmol/L (ref 22–32)
Calcium: 9 mg/dL (ref 8.9–10.3)
Chloride: 101 mmol/L (ref 98–111)
Creatinine, Ser: 0.72 mg/dL (ref 0.44–1.00)
GFR, Estimated: >60 mL/min
Glucose, Bld: 127 mg/dL — ABNORMAL HIGH (ref 70–99)
Potassium: 3.8 mmol/L (ref 3.5–5.1)
Sodium: 138 mmol/L (ref 135–145)
Total Bilirubin: 0.4 mg/dL (ref 0.0–1.2)
Total Protein: 6.3 g/dL — ABNORMAL LOW (ref 6.5–8.1)

## 2024-02-23 LAB — CBC
HCT: 37.8 % (ref 36.0–46.0)
Hemoglobin: 12.7 g/dL (ref 12.0–15.0)
MCH: 28.5 pg (ref 26.0–34.0)
MCHC: 33.6 g/dL (ref 30.0–36.0)
MCV: 84.8 fL (ref 80.0–100.0)
Platelets: 241 K/uL (ref 150–400)
RBC: 4.46 MIL/uL (ref 3.87–5.11)
RDW: 14.3 % (ref 11.5–15.5)
WBC: 8 K/uL (ref 4.0–10.5)
nRBC: 0 % (ref 0.0–0.2)

## 2024-02-23 LAB — HEPARIN LEVEL (UNFRACTIONATED): Heparin Unfractionated: 0.44 [IU]/mL (ref 0.30–0.70)

## 2024-02-23 MED ORDER — DEXAMETHASONE SODIUM PHOSPHATE 4 MG/ML IJ SOLN
4.0000 mg | Freq: Once | INTRAMUSCULAR | Status: AC
  Administered 2024-02-23: 4 mg via INTRAVENOUS
  Filled 2024-02-23: qty 1

## 2024-02-23 MED ORDER — METOCLOPRAMIDE HCL 5 MG/ML IJ SOLN
10.0000 mg | Freq: Once | INTRAMUSCULAR | Status: AC
  Administered 2024-02-23: 10 mg via INTRAVENOUS
  Filled 2024-02-23: qty 2

## 2024-02-23 MED ORDER — SODIUM CHLORIDE 0.9 % IV BOLUS
500.0000 mL | Freq: Once | INTRAVENOUS | Status: AC
  Administered 2024-02-23: 500 mL via INTRAVENOUS

## 2024-02-23 MED ORDER — DIPHENHYDRAMINE HCL 50 MG/ML IJ SOLN
25.0000 mg | Freq: Once | INTRAMUSCULAR | Status: AC
  Administered 2024-02-23: 25 mg via INTRAVENOUS
  Filled 2024-02-23: qty 1

## 2024-02-23 MED ORDER — ACETAMINOPHEN 650 MG RE SUPP: 650.0000 mg | RECTAL | Status: DC | PRN

## 2024-02-23 MED ORDER — ACETAMINOPHEN 325 MG PO TABS
650.0000 mg | ORAL_TABLET | Freq: Four times a day (QID) | ORAL | Status: DC | PRN
  Administered 2024-02-24 – 2024-02-25 (×2): 650 mg via ORAL
  Filled 2024-02-23 (×4): qty 2

## 2024-02-23 MED ORDER — ACETAMINOPHEN 160 MG/5ML PO SOLN
650.0000 mg | ORAL | Status: DC | PRN
  Filled 2024-02-23: qty 20.3

## 2024-02-23 MED ORDER — ACETAMINOPHEN 325 MG PO TABS
650.0000 mg | ORAL_TABLET | ORAL | Status: DC | PRN
  Filled 2024-02-23: qty 2

## 2024-02-23 NOTE — Plan of Care (Signed)
 Had a throbbing headache twice, felt different than yesterday and CT was done.    Problem: Education: Goal: Knowledge of disease or condition will improve Outcome: Progressing   Problem: Coping: Goal: Will verbalize positive feelings about self Outcome: Progressing   Problem: Self-Care: Goal: Ability to participate in self-care as condition permits will improve Outcome: Progressing   Problem: Self-Care: Goal: Ability to communicate needs accurately will improve Outcome: Progressing   Problem: Education: Goal: Knowledge of General Education information will improve Description: Including pain rating scale, medication(s)/side effects and non-pharmacologic comfort measures Outcome: Progressing   Problem: Clinical Measurements: Goal: Will remain free from infection Outcome: Progressing   Problem: Activity: Goal: Risk for activity intolerance will decrease Outcome: Progressing   Problem: Pain Managment: Goal: General experience of comfort will improve and/or be controlled Outcome: Progressing   Problem: Skin Integrity: Goal: Risk for impaired skin integrity will decrease Outcome: Progressing

## 2024-02-23 NOTE — Progress Notes (Signed)
" °   02/23/24 1523  NIH Stroke Scale   Dizziness Present No  Headache Present Yes  Interval Other (Comment) (additional assessment because pt said she feels a change)  Level of Consciousness (1a.)    0  LOC Questions (1b. )    0  LOC Commands (1c. )    0  Best Gaze (2. )   0  Visual (3. )   0  Facial Palsy (4. )     0  Motor Arm, Left (5a. )    0  Motor Arm, Right (5b. )  1  Motor Leg, Left (6a. )   0  Motor Leg, Right (6b. )  0  Limb Ataxia (7. ) 1  Sensory (8. )   1  Best Language (9. )   0  Dysarthria (10. ) 0  Extinction/Inattention (11.)    0  Complete NIHSS TOTAL 3   She called and said she feels different and funny and wanted to see doctor. I paged to doctor and NP Karna to see if they could come to the bedside. I did full neuro assessment and NIHSS and the only change was decrease in sensation on right side. NP came to bedside and said she will send her down for head CT and add something else for headache.Also, NP did discuss anxiety with her and reminded her that she has to try to remain calm.  She also asked her to not get on the computer, as that could be adding to her headache.   "

## 2024-02-23 NOTE — Progress Notes (Signed)
 Inpatient Rehab Admissions Coordinator:   Insurance auth for [employer] pending.  Will follow.   Reche Lowers, PT, DPT Admissions Coordinator [phone] 02/23/24 10:28 AM

## 2024-02-23 NOTE — Progress Notes (Signed)
 Transported to CT accompanied by this nurse and tech.

## 2024-02-23 NOTE — Progress Notes (Signed)
 PHARMACY - ANTICOAGULATION CONSULT NOTE  Pharmacy Consult for Heparin  Indication: superior sagittal sinus thrombosis  Allergies[1]  Patient Measurements: Height: 5' 1 (154.9 cm) Weight: 99.9 kg (220 lb 3.8 oz) IBW/kg (Calculated) : 47.8 HEPARIN  DW (KG): 71.8  Vital Signs: Temp: 98.1 F (36.7 C) (01/20 0420) Temp Source: Oral (01/20 0420) BP: 106/65 (01/20 0420) Pulse Rate: 66 (01/20 0420)  Labs: Recent Labs    02/20/24 1511 02/20/24 1514 02/21/24 0528 02/21/24 1153 02/21/24 1839 02/22/24 0455 02/23/24 0441  HGB 13.8   < > 13.1  --   --  12.7 12.7  HCT 42.1   < > 38.3  --   --  37.7 37.8  PLT 271  --  276  --   --  254 241  APTT 29  --   --   --   --   --   --   LABPROT 12.4  --   --   --   --   --   --   INR 0.9  --   --   --   --   --   --   HEPARINUNFRC  --    < > 0.14*   < > 0.33 0.31 0.44  CREATININE 0.70   < > 0.63  --   --  0.66 0.72   < > = values in this interval not displayed.    Estimated Creatinine Clearance: 82 mL/min (by C-G formula based on SCr of 0.72 mg/dL).   Medical History: Past Medical History:  Diagnosis Date   Angina at rest    Anxiety    Chest pain    Diabetes mellitus without complication (HCC)    High cholesterol    High cholesterol    History of angina    History of TIA (transient ischemic attack) 10/11/2020   Hypercholesterolemia 10/11/2020   Hyperlipidemia    Migraine    Personal history of COVID-19 10/11/2020   Radiculopathy of cervical region    TIA (transient ischemic attack)    Transient cerebral ischemic attack    Type II diabetes mellitus (HCC) 10/11/2020     Assessment: 59 yo female presents with L Parietal hemorrhage/SAH, partially occlusive superior saggital sinus thrombosis. Pharmacy consulted to dose heparin  neuro protocol.  HL therapeutic this AM. Continue current rate until ready for PO anticoagulation.   Goal of Therapy:  Heparin  level 0.3-0.5 units/ml Monitor platelets by anticoagulation protocol:  Yes   Plan:  Cont heparin  slightly to 1250 units/hr  Monitor daily heparin  level, CBC, signs/symptoms of bleeding    Sergio Batch, PharmD, BCIDP, AAHIVP, CPP Infectious Disease Pharmacist 02/23/2024 7:59 AM        [1]  Allergies Allergen Reactions   Hazelnut (Filbert) Anaphylaxis   Penicillins Anaphylaxis   Coconut (Cocos Nucifera)    Cocos Nucifera    Ozempic (0.25 Or 0.5 Mg-Dose) [Semaglutide(0.25 Or 0.5mg -Dos)] Rash   Rosuvastatin  Other (See Comments)    Dizzy per pt   Tirzepatide Rash

## 2024-02-23 NOTE — Progress Notes (Addendum)
 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  Was noted to have seizure activity before admission and was placed on Keppra   1/19: mild R sided weakness, LTM EEG neg  INTERIM HISTORY/SUBJECTIVE No family at the bedside.  Patient is awake alert sitting up in bed in no apparent distress.  She does states she has a migraine at the moment and is waiting for medication.  She remains on a heparin  drip No new neurological events overnight.  Neurological exam remains stable and unchanged  CBC    Component Value Date/Time   WBC 8.0 02/23/2024 0441   RBC 4.46 02/23/2024 0441   HGB 12.7 02/23/2024 0441   HGB 13.7 01/22/2024 1626   HCT 37.8 02/23/2024 0441   HCT 41.9 01/22/2024 1626   PLT 241 02/23/2024 0441   PLT 302 01/22/2024 1626   MCV 84.8 02/23/2024 0441   MCV 87 01/22/2024 1626   MCH 28.5 02/23/2024 0441   MCHC 33.6 02/23/2024 0441   RDW 14.3 02/23/2024 0441   RDW 14.4 01/22/2024 1626   LYMPHSABS 2.6 02/20/2024 1511   MONOABS 0.5 02/20/2024 1511   EOSABS 0.2 02/20/2024 1511   BASOSABS 0.0 02/20/2024 1511    BMET    Component Value Date/Time   NA 138 02/23/2024 0441   NA 141 01/22/2024 1626   K 3.8 02/23/2024 0441   CL 101 02/23/2024 0441   CO2 27 02/23/2024 0441   GLUCOSE 127 (H) 02/23/2024 0441   BUN 12 02/23/2024 0441   BUN 14 01/22/2024 1626   CREATININE 0.72 02/23/2024 0441   CALCIUM  9.0 02/23/2024 0441   EGFR 101 01/22/2024 1626   GFRNONAA >60 02/23/2024 0441    IMAGING past 24 hours    Vitals:   02/23/24 0350 02/23/24 0420 02/23/24 0814 02/23/24 1149  BP: 116/63 106/65 (!) 114/51 122/69  Pulse: 72 66 76 71  Resp: 16 16 17 17   Temp:  98.1 F (36.7 C) 98.2 F (36.8 C) 98.2 F (36.8 C)  TempSrc:  Oral Oral Oral  SpO2:  97% 97% 100%  Weight:      Height:         PHYSICAL EXAM General:  Alert, well-nourished, well-developed patient  in no acute distress Psych:  Mood and affect appropriate for situation CV: Regular rate and rhythm on monitor Respiratory:  Regular, unlabored respirations on room air   NEURO:  Mental Status: AA&Ox3, patient is able to give clear and coherent history Speech/Language: speech is without dysarthria or aphasia.    Cranial Nerves:  II: PERRL. Visual fields full.  III, IV, VI: EOMI. Eyelids elevate symmetrically.  V: Sensation is intact to light touch and symmetrical to face.  VII: Face is symmetrical resting and smiling VIII: hearing intact to voice. IX, X: Phonation is normal.  KP:Dynloizm shrug 5/5. XII: tongue is midline without fasciculations. Motor: Able to move all 4 extremities against gravity, RUE 4/5 proximal and distal. RLE 5/5 proximal and 4/5 distally. LUE and LLE 5/5 Tone: is normal and bulk is normal Sensation- Intact to light touch bilaterally but diminished in the right lower extremity Coordination: FTN intact bilaterally Gait- deferred   ASSESSMENT/PLAN  Ms. Vidya Crownover is a 59 y.o. female with history of hypertension, hyperlipidemia, diabetes, TIA and migraines admitted for disorientation and right leg weakness and numbness.  She reports that she was at work and when symptoms started, and she noted  right leg twitching at the time of onset of symptoms.  Right leg twitching reoccurred while patient was in the emergency department, and she was given Keppra , Ativan  and Vimpat  with resolution of seizure.  She was placed on LTM EEG.  CT head revealed right parietal lobe ICH with subarachnoid hemorrhage.  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 Code Stroke CT head right parietal lobe ICH with volume of 4.7 mL, aspects 9 CTA head & neck nonopacification of superior sagittal sinus consistent with superior sagittal sinus thrombosis,  mild narrowing at the origin of the right vertebral artery CT venogram filling defect involving the superior sagittal sinus to the level of the torcular concerning for dural venous sinus thrombosis MRI stable hemorrhagic infarct in high left posterior parietal lobe with surrounding vasogenic edema and local sulcal effacement, superior sagittal sinus thrombosis MRV Diffuse thrombus confirmed within the superior sagittal sinus.  CT repeat x 4 stable hematoma 2D Echo EF 55 to 60% Bilateral lower extremity ultrasound no DVT LDL 140 HgbA1c 6.2 UDS benzo Hypercoagulable work up pending VTE prophylaxis -fully anticoagulated with heparin  No antithrombotic prior to admission, now on heparin  IV.  Will transition to Eliquis  once neuro stable Therapy recommendations:  CIR Disposition:, Likely home  Seizure Patient noted to have right leg twitching with onset of symptoms at work, which repeated when she was in the emergency department Symptoms resolved after administration of lorazepam , Keppra  and Vimpat  LTM EEG 1/17 to 1/18 continuous slowing in the left hemisphere and generalized intermittent slowing, suggestive of cortical dysfunction arising from left hemisphere and no seizures or epileptiform discharges Continue Keppra  1000 mg twice daily LTM EEG no seizure, d/c'ed  Headaches, intractable Fioricet  and tylenol  PRN alternating Headache cocktail x 1 Expecting improving over time Serial CT repeat stable  Hypertension Home meds: None Stable Off cleviprex  BP goal < 160 Long term BP goal normotensive  Hyperlipidemia Home meds: None LDL 140, goal < 70 Add lipitor 40 Add statin at discharge  Diabetes type II Controlled Home meds: None HgbA1c 6.2, goal < 7.0 CBGs SSI Recommend close follow-up with PCP for better DM control  Other Stroke Risk Factors Obesity, Body mass index is 41.61 kg/m., BMI >/= 30 associated with increased stroke risk, recommend weight loss, diet and exercise as  appropriate  Migraines TIA  Hospital day # 3   Karna Geralds DNP, ACNPC-AG  [facility]  Neuro[facility]ist  ATTENDING NOTE: I reviewed above note and agree with the assessment and plan. Pt was seen and examined.   Husband at the bedside. Pt lying in bed, still complaining of HA, currently on fioricet  and tylenol  PRN alternating. Will give cocktail x 1. Pt still has slight weakness on the LUE, expected. Serial CT repeat stable. No on heparin  IV, will transition to eliquis  once neuro stabilized.   For detailed assessment and plan, please refer to above as I have made changes wherever appropriate.   Ary Cummins, MD PhD Stroke Neurology 02/23/2024 11:43 PM    To contact Stroke Continuity provider, please refer to [email or web address]. After hours, contact General Neurology

## 2024-02-23 NOTE — PMR Pre-admission (Signed)
 PMR Admission Coordinator Pre-Admission Assessment  Patient: Hailey [name] is an 59 y.o., female MRN: [id number] DOB: 06/15/1965 Height: 5' 1 (154.9 cm) Weight: 99.9 kg  Insurance Information HMO:     PPO:      PCP:      IPA:      80/20:      OTHER: EPO PRIMARY: [employer] NAP      Policy#: [id number]      Subscriber: pt CM Name: Hailey [name]      Phone#: [phone]     Fax#: [phone] Pre-Cert#: [id number] auth for CIR from Hailey [name] with [employer] [facility] for admit 1/21 with next review date 1/27.  Updates due to Hailey [name] at fax listed above.        Employer: [employer] Benefits:  Phone #: [phone]     Name:  Eff. Date: 02/04/23     Deduct: $250 (met)      Out of Pocket Max: $1500 ($0 met)    Life Max:  CIR: 90%      SNF: 90% Outpatient: 90%     Co-Pay: 10% Home Health: 90%      Co-Pay: 10% DME: 90%     Co-Pay: 10% Providers:  SECONDARY:       Policy#:      Phone#:   Artist:       Phone#:   The Best Boy for patients in Inpatient Rehabilitation Facilities with attached Privacy Act Statement-Health Care Records was provided and verbally reviewed with: Patient and Family  Emergency Contact Information Contact Information     Name Relation Home Work Mobile   B and E Spouse   [phone]      Other Contacts   None on File     Current Medical History  Patient Admitting Diagnosis: ICH   History of Present Illness: Pt is a 59 y/o female with PMH of HLD, DM, TIA, and migraines who admitted to [facility] on 02/20/24 from work with RLE hemiparesis and disorientation.  Code stroke activated by EDP.  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  She was noted to have rhythmic RLE twitching concerning for focal seizure and loaded with Keppra , ativan , and vimpat  with resolution.  ICH did not seem to confine to a single vessel distribution so CTA/CTV completed which showed partially occlusive  superior sagittal sinus thrombosis.  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  She had serial CTs which showed stable hematoma.  Hypercoagulable workup is pending.  LTM EEG showed no seizures.  She is on heparin  with anticoagulation plan pending.  She did require cleviprex  for BP control.  She will continue on Keppra  BID.  Therapy ongoing and pt has been recommended for CIR.   Complete NIHSS TOTAL: 2  Patient's medical record from Hailey [name] has been reviewed by the rehabilitation admission coordinator and physician.  Past Medical History  Past Medical History:  Diagnosis Date   Angina at rest    Anxiety    Chest pain    Diabetes mellitus without complication (HCC)    High cholesterol    High cholesterol    History of angina    History of TIA (transient ischemic attack) 10/11/2020   Hypercholesterolemia 10/11/2020   Hyperlipidemia    Migraine    Personal history of COVID-19 10/11/2020   Radiculopathy of cervical region    TIA (transient ischemic attack)    Transient cerebral ischemic attack    Type II  diabetes mellitus (HCC) 10/11/2020    Has the patient had major surgery during 100 days prior to admission? No  Family History   family history includes Breast cancer in her maternal aunt and paternal aunt.  Current Medications Current Medications[1]  Patients Current Diet:  Diet Order             Diet heart healthy/carb modified Room service appropriate? Yes; Fluid consistency: Thin  Diet effective now                   Precautions / Restrictions Precautions Precautions: Fall Precaution/Restrictions Comments: EEG running continuous Restrictions Weight Bearing Restrictions Per Provider Order: No   Has the patient had 2 or more falls or a fall with injury in the past year? 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 Independent  Indoor Mobility: Did the patient need assistance with walking from room to room (with or without device)? Independent  Stairs: Did the patient need assistance with internal or external stairs (with or without device)? Independent  Functional Cognition: Did the patient need help planning regular tasks such as shopping or remembering to take medications? Independent  Patient Information Are you of Hispanic, Latino/a,or Spanish origin?: A. No, not of Hispanic, Latino/a, or Spanish origin What is your race?: A. White Do you need or want an interpreter to communicate with a doctor or health care staff?: 0. No  Patient's Response To:  Health Literacy and Transportation Is the patient able to respond to health literacy and transportation needs?: Yes Health Literacy - How often do you need to have someone help you when you read instructions, pamphlets, or other written material from your doctor or pharmacy?: Never In the past 12 months, has lack of transportation kept you from medical appointments or from getting medications?: No In the past 12 months, has lack of transportation kept you from meetings, work, or from getting things needed for daily living?: No  Home Assistive Devices / Equipment Home Equipment: Hand held shower head  Prior Device Use: Indicate devices/aids used by the patient prior to current illness, exacerbation or injury? None of the above  Current Functional Level Cognition  Arousal/Alertness: Awake/alert Overall Cognitive Status: Impaired/Different from baseline Orientation Level: Oriented X4 Attention: Sustained Sustained Attention: Appears intact Memory: Impaired Memory Impairment: Retrieval deficit Awareness: Impaired Awareness Impairment: Emergent impairment Problem Solving: Impaired Problem Solving Impairment: Verbal basic Executive Function: Reasoning Reasoning: Appears intact    Extremity  Assessment (includes Sensation/Coordination)  Upper Extremity Assessment: Right hand dominant, RUE deficits/detail RUE Deficits / Details: pt noted to have decrease grasp 3 out 5 pt with decreased fine motor coordination reports its tingling and numb RUE Sensation: decreased light touch RUE Coordination: decreased fine motor, decreased gross motor  Lower Extremity Assessment: Defer to PT evaluation RLE Deficits / Details: knee extension 4/5, ankle DF 4/5; RLE Sensation:  (tingling) RLE Coordination: decreased gross motor    ADLs  Overall ADL's : Needs assistance/impaired Eating/Feeding: Minimal assistance Grooming: Minimal assistance Upper Body Bathing: Moderate assistance Lower Body Bathing: Maximal assistance Upper Body Dressing : Moderate assistance Lower Body Dressing: Maximal assistance General ADL Comments: pt needed (A) to thread sock and then pull up especially on the R side    Mobility  Overal bed mobility: Needs Assistance Bed Mobility: Supine to Sit Supine to sit: Contact guard, HOB elevated  Sit to supine: Contact guard assist General bed mobility comments: increased time and effort    Transfers  Overall transfer level: Needs assistance Equipment used: Rolling walker (2 wheels) Transfers: Sit to/from Stand Sit to Stand: Min assist General transfer comment: assist to power up. Assist with R hand placement on RW.    Ambulation / Gait / Stairs / Wheelchair Mobility  Ambulation/Gait Ambulation/Gait assistance: Mod assist Gait Distance (Feet): 12 Feet Assistive device: Rolling walker (2 wheels) Gait Pattern/deviations: Step-through pattern, Decreased step length - right, Steppage, Narrow base of support General Gait Details: unsteady gait. Assist to maintain balance. Assist to maintain R grip on RW. Gait velocity: decreased Gait velocity interpretation: <1.8 ft/sec, indicate of risk for recurrent falls    Posture / Balance Balance Overall balance assessment: Needs  assistance Sitting-balance support: No upper extremity supported, Feet supported Sitting balance-Leahy Scale: Good Standing balance support: Bilateral upper extremity supported, During functional activity, Reliant on assistive device for balance Standing balance-Leahy Scale: Poor    Special considerations/life events  Diabetic management yes   Previous Home Environment (from acute therapy documentation) Living Arrangements: Spouse/significant other, Other (Comment) (dog (71 lb lab Herlene))  Lives With: Spouse Available Help at Discharge: Family, Available 24 hours/day Type of Home: House Home Layout: One level Home Access: Stairs to enter Entrance Stairs-Rails: Right Entrance Stairs-Number of Steps: 2 Bathroom Shower/Tub: Health Visitor: Standard (vanity beside) Additional Comments: has a Programme Researcher, Broadcasting/film/video,  Discharge Living Setting Plans for Discharge Living Setting: Patient's home, Lives with (comment) (spouse and set up per spouse report) Type of Home at Discharge: House Discharge Home Layout: One level Discharge Home Access: Stairs to enter Entrance Stairs-Rails: Left Entrance Stairs-Number of Steps: 5-6 Discharge Bathroom Shower/Tub: Walk-in shower Discharge Bathroom Toilet: Standard Discharge Bathroom Accessibility: Yes How Accessible: Accessible via walker Does the patient have any problems obtaining your medications?: No  Social/Family/Support Systems Patient Roles: Spouse Anticipated Caregiver: spouse Reyes Anticipated Industrial/product Designer Information: [phone] Ability/Limitations of Caregiver: works during the day, will take time off or arrange caregivers Caregiver Availability: 24/7 Discharge Plan Discussed with Primary Caregiver: Yes Is Caregiver In Agreement with Plan?: Yes Does Caregiver/Family have Issues with Lodging/Transportation while Pt is in Rehab?: No  Goals Patient/Family Goal for Rehab: PT/OT/SLP supervision Expected length of  stay: 10-12 days Additional Information: Discharge plan: discharge to pt's home where she will have 24/7 supervision from spouse and other caregivers Pt/Family Agrees to Admission and willing to participate: Yes Program Orientation Provided & Reviewed with Pt/Caregiver Including Roles  & Responsibilities: Yes Additional Information Needs: reviewed with spouse/pt HH therapy versus non-skilled caregivers and family responsibility to arrange caregivers  Decrease burden of Care through IP rehab admission: n/a  Possible need for SNF placement upon discharge: Not anticipated.  Plan for discharge back to pt's home at supervision level.  Spouse and other caregivers to provide 24/7 supervision.   Patient Condition: I have reviewed medical records from [facility], spoken with [facility] team, and patient and spouse. I met with patient at the bedside for inpatient rehabilitation assessment.  Patient will benefit from ongoing PT, OT, and SLP, can actively participate in 3 hours of therapy a day 5 days of the week, and can make measurable gains during the admission.  Patient will also benefit from the coordinated team approach during an Inpatient Acute Rehabilitation admission.  The patient will receive intensive therapy as well as Rehabilitation physician, nursing, social worker, and care management interventions.  Due to bladder management, bowel management,  safety, skin/wound care, disease management, medication administration, pain management, and patient education the patient requires 24 hour a day rehabilitation nursing.  The patient is currently mod assist of 2 with mobility and basic ADLs.  Discharge setting and therapy post discharge at home is anticipated.  Patient has agreed to participate in the Acute Inpatient Rehabilitation Program and will admit today.  Preadmission Screen Completed By:  Reche FORBES Lowers, PT, DPT 02/23/2024 11:56 AM ______________________________________________________________________    Discussed status with Dr. Babs on 02/25/24  at 9:51 AM  and received approval for admission today.  Admission Coordinator:  Caitlin E Warren, PT, DPT time 9:51 AM Pattricia 02/25/24    Assessment/Plan: Diagnosis: left parietal ICH Does the need for close, 24 hr/day Medical supervision in concert with the patient's rehab needs make it unreasonable for this patient to be served in a less intensive setting? Yes Co-Morbidities requiring supervision/potential complications: HTN, seizures, anxiety, DM Due to bladder management, bowel management, safety, skin/wound care, disease management, medication administration, pain management, and patient education, does the patient require 24 hr/day rehab nursing? Yes Does the patient require coordinated care of a physician, rehab nurse, PT, OT, and SLP to address physical and functional deficits in the context of the above medical diagnosis(es)? Yes Addressing deficits in the following areas: balance, endurance, locomotion, strength, transferring, bowel/bladder control, bathing, dressing, feeding, grooming, toileting, cognition, speech, language, swallowing, and psychosocial support Can the patient actively participate in an intensive therapy program of at least 3 hrs of therapy 5 days a week? Yes The potential for patient to make measurable gains while on inpatient rehab is excellent Anticipated functional outcomes upon discharge from inpatient rehab: supervision PT, supervision OT, supervision SLP Estimated rehab length of stay to reach the above functional goals is: 10-12 days Anticipated discharge destination: Home 10. Overall Rehab/Functional Prognosis: excellent   MD Signature: Arthea IVAR Babs, MD, [facility] Physical Medicine & Rehabilitation Medical Director Rehabilitation Services 02/25/2024      [1]  Current Facility-Administered Medications:    acetaminophen  (TYLENOL ) tablet 650 mg, 650 mg, Oral, Q4H PRN, 650 mg at 02/23/24 0048  **OR** acetaminophen  (TYLENOL ) 160 MG/5ML solution 650 mg, 650 mg, Per Tube, Q4H PRN **OR** acetaminophen  (TYLENOL ) suppository 650 mg, 650 mg, Rectal, Q4H PRN, Lehner, Erin C, NP   atorvastatin  (LIPITOR) tablet 40 mg, 40 mg, Oral, Daily, Jerri Pfeiffer, MD, 40 mg at 02/23/24 1013   butalbital -acetaminophen -caffeine  (FIORICET ) 50-325-40 MG per tablet 1 tablet, 1 tablet, Oral, Q8H PRN, Jerri Pfeiffer, MD, 1 tablet at 02/23/24 0446   Chlorhexidine  Gluconate Cloth 2 % PADS 6 each, 6 each, Topical, Daily, Khaliqdina, Salman, MD, 6 each at 02/22/24 1238   gabapentin  (NEURONTIN ) capsule 100 mg, 100 mg, Oral, TID, Jerri Pfeiffer, MD, 100 mg at 02/23/24 1013   heparin  ADULT infusion 100 units/mL (25000 units/250mL), 1,250 Units/hr, Intravenous, Continuous, Merilee Linsey I, RPH, Last Rate: 12.5 mL/hr at 02/23/24 0722, 1,250 Units/hr at 02/23/24 9277   insulin  aspart (novoLOG ) injection 0-20 Units, 0-20 Units, Subcutaneous, TID WC, Khaliqdina, Salman, MD, 4 Units at 02/22/24 1140   insulin  aspart (novoLOG ) injection 0-5 Units, 0-5 Units, Subcutaneous, QHS, Khaliqdina, Salman, MD   labetalol  (NORMODYNE ) injection 20 mg, 20 mg, Intravenous, Once **AND** [DISCONTINUED] clevidipine  (CLEVIPREX ) infusion 0.5 mg/mL, 0-21 mg/hr, Intravenous, Continuous, Judithe Longs C, NP, Held at 02/20/24 1612   levETIRAcetam  (KEPPRA ) undiluted injection 1,000 mg, 1,000 mg, Intravenous, BID, 1,000 mg at 02/21/24 1015 **OR** levETIRAcetam  (KEPPRA ) tablet 1,000 mg, 1,000 mg, Oral, BID, Khaliqdina, Salman, MD,  1,000 mg at 02/23/24 1019   ondansetron  (ZOFRAN ) injection 4 mg, 4 mg, Intravenous, Q6H PRN, Jerri Pfeiffer, MD, 4 mg at 02/22/24 0746   Oral care mouth rinse, 15 mL, Mouth Rinse, PRN, Khaliqdina, Salman, MD   senna-docusate (Senokot-S) tablet 1 tablet, 1 tablet, Oral, BID, Lehner, Erin C, NP, 1 tablet at 02/23/24 1013

## 2024-02-23 NOTE — Progress Notes (Signed)
 Said she feels better since she received the headache combo meds.  Said she still has headache but it is not near as intense as it was.  She is ordering her supper because has not eaten today, except a couple bites at breakfast.  [place] she thinks it will help her feel better as well.

## 2024-02-23 NOTE — Progress Notes (Signed)
 Returned from CT. Back in room and said feels a little better.

## 2024-02-23 NOTE — Plan of Care (Signed)
  Problem: Education: Goal: Knowledge of disease or condition will improve Outcome: Progressing Goal: Knowledge of secondary prevention will improve (MUST DOCUMENT ALL) Outcome: Progressing Goal: Knowledge of patient specific risk factors will improve (DELETE if not current risk factor) Outcome: Progressing   Problem: Intracerebral Hemorrhage Tissue Perfusion: Goal: Complications of Intracerebral Hemorrhage will be minimized Outcome: Progressing   Problem: Coping: Goal: Will verbalize positive feelings about self Outcome: Progressing Goal: Will identify appropriate support needs Outcome: Progressing

## 2024-02-24 ENCOUNTER — Encounter: Payer: Self-pay | Admitting: Internal Medicine

## 2024-02-24 ENCOUNTER — Encounter (HOSPITAL_COMMUNITY): Payer: Self-pay | Admitting: Neurology

## 2024-02-24 ENCOUNTER — Telehealth (HOSPITAL_COMMUNITY): Payer: Self-pay

## 2024-02-24 ENCOUNTER — Other Ambulatory Visit (HOSPITAL_COMMUNITY): Payer: Self-pay

## 2024-02-24 DIAGNOSIS — E669 Obesity, unspecified: Secondary | ICD-10-CM | POA: Diagnosis not present

## 2024-02-24 DIAGNOSIS — I611 Nontraumatic intracerebral hemorrhage in hemisphere, cortical: Secondary | ICD-10-CM | POA: Diagnosis not present

## 2024-02-24 DIAGNOSIS — G08 Intracranial and intraspinal phlebitis and thrombophlebitis: Secondary | ICD-10-CM | POA: Diagnosis not present

## 2024-02-24 DIAGNOSIS — E119 Type 2 diabetes mellitus without complications: Secondary | ICD-10-CM | POA: Diagnosis not present

## 2024-02-24 DIAGNOSIS — I10 Essential (primary) hypertension: Secondary | ICD-10-CM | POA: Diagnosis not present

## 2024-02-24 DIAGNOSIS — E785 Hyperlipidemia, unspecified: Secondary | ICD-10-CM | POA: Diagnosis not present

## 2024-02-24 DIAGNOSIS — R569 Unspecified convulsions: Secondary | ICD-10-CM | POA: Diagnosis not present

## 2024-02-24 DIAGNOSIS — Z6841 Body Mass Index (BMI) 40.0 and over, adult: Secondary | ICD-10-CM | POA: Diagnosis not present

## 2024-02-24 LAB — GLUCOSE, CAPILLARY
Glucose-Capillary: 137 mg/dL — ABNORMAL HIGH (ref 70–99)
Glucose-Capillary: 111 mg/dL — ABNORMAL HIGH (ref 70–99)
Glucose-Capillary: 117 mg/dL — ABNORMAL HIGH (ref 70–99)
Glucose-Capillary: 126 mg/dL — ABNORMAL HIGH (ref 70–99)

## 2024-02-24 LAB — HEPARIN LEVEL (UNFRACTIONATED): Heparin Unfractionated: 0.46 [IU]/mL (ref 0.30–0.70)

## 2024-02-24 LAB — CARDIOLIPIN ANTIBODIES, IGG, IGM, IGA
Anticardiolipin IgA: <9 U/mL (ref 0–11)
Anticardiolipin IgG: <9 GPL U/mL (ref 0–14)
Anticardiolipin IgM: <9 [MPL'U]/mL (ref 0–12)

## 2024-02-24 MED ORDER — APIXABAN 5 MG PO TABS
5.0000 mg | ORAL_TABLET | Freq: Two times a day (BID) | ORAL | Status: DC
  Administered 2024-02-24 – 2024-02-25 (×3): 5 mg via ORAL
  Filled 2024-02-24 (×3): qty 1

## 2024-02-24 NOTE — Plan of Care (Signed)
 Ambulated with assistance multiple times. Husband by her side all day. No any complaints.   Problem: Skin Integrity: Goal: Risk for impaired skin integrity will decrease Outcome: Progressing   Problem: Skin Integrity: Goal: Risk for impaired skin integrity will decrease Outcome: Progressing   Problem: Safety: Goal: Ability to remain free from injury will improve Outcome: Progressing   Problem: Pain Managment: Goal: General experience of comfort will improve and/or be controlled Outcome: Progressing   Problem: Coping: Goal: Level of anxiety will decrease Outcome: Progressing   Problem: Activity: Goal: Risk for activity intolerance will decrease Outcome: Progressing   Problem: Clinical Measurements: Goal: Will remain free from infection Outcome: Progressing   Problem: Education: Goal: Knowledge of disease or condition will improve Outcome: Progressing

## 2024-02-24 NOTE — Telephone Encounter (Signed)
 Pharmacy Patient Advocate Encounter  Insurance verification completed.    The patient is insured through [employer]. Patient has [employer], may use a copay card, and/or apply for patient assistance if available.    Ran test claim for Eliquis  5mg  tablet and the current 30 day co-pay is $83.60.   This test claim was processed through [facility]- copay amounts may vary at other pharmacies due to pharmacy/plan contracts, or as the patient moves through the different stages of their insurance plan.

## 2024-02-24 NOTE — Progress Notes (Addendum)
 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  Was noted to have seizure activity before admission and was placed on Keppra   1/19: mild R sided weakness, LTM EEG neg  INTERIM HISTORY/SUBJECTIVE Husband is at the bedside.  Patient is sitting up in bed awake alert in no apparent distress. Today afternoon she developed a severe headache 10 out of 10 repeat CT head was unchanged.  She states her headache is better currently Will transition patient to Eliquis  today We are still waiting for an available rehab bed  CBC    Component Value Date/Time   WBC 8.0 02/23/2024 0441   RBC 4.46 02/23/2024 0441   HGB 12.7 02/23/2024 0441   HGB 13.7 01/22/2024 1626   HCT 37.8 02/23/2024 0441   HCT 41.9 01/22/2024 1626   PLT 241 02/23/2024 0441   PLT 302 01/22/2024 1626   MCV 84.8 02/23/2024 0441   MCV 87 01/22/2024 1626   MCH 28.5 02/23/2024 0441   MCHC 33.6 02/23/2024 0441   RDW 14.3 02/23/2024 0441   RDW 14.4 01/22/2024 1626   LYMPHSABS 2.6 02/20/2024 1511   MONOABS 0.5 02/20/2024 1511   EOSABS 0.2 02/20/2024 1511   BASOSABS 0.0 02/20/2024 1511    BMET    Component Value Date/Time   NA 138 02/23/2024 0441   NA 141 01/22/2024 1626   K 3.8 02/23/2024 0441   CL 101 02/23/2024 0441   CO2 27 02/23/2024 0441   GLUCOSE 127 (H) 02/23/2024 0441   BUN 12 02/23/2024 0441   BUN 14 01/22/2024 1626   CREATININE 0.72 02/23/2024 0441   CALCIUM  9.0 02/23/2024 0441   EGFR 101 01/22/2024 1626   GFRNONAA >60 02/23/2024 0441    IMAGING past 24 hours    Vitals:   02/23/24 2000 02/23/24 2332 02/24/24 0739 02/24/24 1129  BP: 120/85  113/61 111/62  Pulse:  75 (!) 59 70  Resp:   18 16  Temp: 98.5 F (36.9 C) 98.5 F (36.9 C) 97.7 F (36.5 C) 98.3 F (36.8 C)  TempSrc: Oral Oral Oral Oral  SpO2: 99% 98% 98% 96%  Weight:      Height:         PHYSICAL EXAM General:  Alert,  well-nourished, well-developed patient in no acute distress Psych:  Mood and affect appropriate for situation CV: Regular rate and rhythm on monitor Respiratory:  Regular, unlabored respirations on room air   NEURO:  Mental Status: AA&Ox3, patient is able to give clear and coherent history Speech/Language: speech is without dysarthria or aphasia.    Cranial Nerves:  II: PERRL. Visual fields full.  III, IV, VI: EOMI. Eyelids elevate symmetrically.  V: Sensation is intact to light touch and symmetrical to face.  VII: Face is symmetrical resting and smiling VIII: hearing intact to voice. IX, X: Phonation is normal.  KP:Dynloizm shrug 5/5. XII: tongue is midline without fasciculations. Motor: Able to move all 4 extremities against gravity, RUE 5-/5 proximal and distal. RLE 5/5 proximal and 5/5 distally. LUE and LLE 5/5 Tone: is normal and bulk is normal Sensation- Intact to light touch bilaterally but diminished in the right lower extremity Coordination: FTN intact bilaterally Gait- deferred   ASSESSMENT/PLAN  Ms. Hailey [name] is a 59 y.o. female with history of hypertension, hyperlipidemia, diabetes, TIA and migraines admitted for disorientation and right leg weakness and numbness.  She reports that she was at work  and when symptoms started, and she noted right leg twitching at the time of onset of symptoms.  Right leg twitching reoccurred while patient was in the emergency department, and she was given Keppra , Ativan  and Vimpat  with resolution of seizure.  She was placed on LTM EEG.  CT head revealed right parietal lobe ICH with subarachnoid hemorrhage.  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 Code Stroke CT head right parietal lobe ICH with volume of 4.7 mL, aspects 9 CTA head & neck nonopacification of superior sagittal sinus consistent  with superior sagittal sinus thrombosis, mild narrowing at the origin of the right vertebral artery CT venogram filling defect involving the superior sagittal sinus to the level of the torcular concerning for dural venous sinus thrombosis MRI stable hemorrhagic infarct in high left posterior parietal lobe with surrounding vasogenic edema and local sulcal effacement, superior sagittal sinus thrombosis MRV Diffuse thrombus confirmed within the superior sagittal sinus.  CT repeat x 4 stable hematoma 2D Echo EF 55 to 60% Bilateral lower extremity ultrasound no DVT LDL 140 HgbA1c 6.2 UDS benzo Hypercoagulable work up pending, ANA and cardiolipin antibodies negative VTE prophylaxis -fully anticoagulated with heparin  No antithrombotic prior to admission, now on heparin  IV.  Will transition to Eliquis  today Therapy recommendations:  CIR Disposition:, Likely home  Seizure Patient noted to have right leg twitching with onset of symptoms at work, which repeated when she was in the emergency department Symptoms resolved after administration of lorazepam , Keppra  and Vimpat  LTM EEG 1/17 to 1/18 continuous slowing in the left hemisphere and generalized intermittent slowing, suggestive of cortical dysfunction arising from left hemisphere and no seizures or epileptiform discharges Continue Keppra  1000 mg twice daily LTM EEG no seizure, d/c'ed  Headaches, intractable Improved today Fioricet  and tylenol  PRN alternating Headache cocktail x 1 Expecting improving over time Serial CT repeat stable  Hypertension Home meds: None Stable Off cleviprex  BP goal < 160 Long term BP goal normotensive  Hyperlipidemia Home meds: None LDL 140, goal < 70 Add lipitor 40 Add statin at discharge  Diabetes type II Controlled Home meds: None HgbA1c 6.2, goal < 7.0 CBGs SSI Recommend close follow-up with PCP for better DM control  Other Stroke Risk Factors Obesity, Body mass index is 41.61 kg/m., BMI >/=  30 associated with increased stroke risk, recommend weight loss, diet and exercise as appropriate  Migraines TIA  Hospital day # 4   Karna Geralds DNP, ACNPC-AG  [facility]uro[facility]  ATTENDING NOTE: I reviewed above note and agree with the assessment and plan. Pt was seen and examined.   Husband at bedside.  Patient standing in front of sink for washing, walk with walker in room, no significant difficulty.  Exam right arm strength near normal although subjective feeling some heaviness.  No headache.  Transition from IV heparin  to Eliquis  today.  Pending CIR.  For detailed assessment and plan, please refer to above as I have made changes wherever appropriate.   Ary Cummins, MD PhD Stroke Neurology 02/24/2024 6:34 PM    To contact Stroke Continuity provider, please refer to [email or web address]. After hours, contact General Neurology

## 2024-02-24 NOTE — Plan of Care (Signed)
  Problem: Education: Goal: Knowledge of disease or condition will improve Outcome: Progressing Goal: Knowledge of secondary prevention will improve (MUST DOCUMENT ALL) Outcome: Progressing Goal: Knowledge of patient specific risk factors will improve (DELETE if not current risk factor) Outcome: Progressing   Problem: Intracerebral Hemorrhage Tissue Perfusion: Goal: Complications of Intracerebral Hemorrhage will be minimized Outcome: Progressing   Problem: Coping: Goal: Will verbalize positive feelings about self Outcome: Progressing Goal: Will identify appropriate support needs Outcome: Progressing   Problem: Health Behavior/Discharge Planning: Goal: Ability to manage health-related needs will improve Outcome: Progressing Goal: Goals will be collaboratively established with patient/family Outcome: Progressing

## 2024-02-24 NOTE — Progress Notes (Signed)
 Speech Language Pathology Treatment:    Patient Details Name: Hailey [name] MRN: [id number] DOB: 09-06-1965 Today's Date: 02/24/2024 Time: 8749-8682 SLP Time Calculation (min) (ACUTE ONLY): 27 min  Assessment / Plan / Recommendation Clinical Impression  Treatment focused primarily on cognition with education re: language with pt/husband and appears to have progressed from documentation of initial evaluation. Difficulty with novel words on assessment. Today she independently recalled multiple pieces of information for episodic as well as prospective memory. Needed min assist for working memory during functional writing/calculation activity. She uses phone/tablet or  journal prior but having difficulty manipulating with right hand. Daughter to bring journal and therapist provided paper/pen in meantime. Comprehended directions and completed problem solving activity using calculations and required moderate assist, cues, repetition for simple addition using paper/pencil. Suspect mild language deficits- (possible dysgraphia) as she verbalized correct number but wrote it incorrectly without immediate awareness. Described difficulty using tablet and playing [employer] and initially suspect language although she described difficulty using right hand. 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 Reports no difficulty reading or comprehending written language. Requested she write a paragraph and review for accuracy. Plans for inpatient CIR.    HPI HPI: 59 yo female admitted 1/17 with L parietal ICH, central venous sinus thrombosis, and seizure activity. NIH 1. 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

## 2024-02-24 NOTE — Progress Notes (Signed)
 Physical Therapy Treatment Patient Details Name: Hailey [name] MRN: [id number] DOB: October 11, 1965 Today's Date: 02/24/2024   History of Present Illness 59 yo F adm 1/17 with CT (+) L parietal lobe ICH and central venous sinus thrombosis noted to have seizure activity NIH 1 PMH HTN HLD DM TIA migraines    PT Comments  Pt tolerated treatment well today. Pt able to progress ambulation in hallway with HHA Min/Mod A. Pt able to complete DGI scoring 7/24 indicating that she is a high fall risk. No change in DC/DME recs at this time. PT will continue to follow.     If plan is discharge home, recommend the following: Two people to help with walking and/or transfers;Assistance with cooking/housework;Assist for transportation;Help with stairs or ramp for entrance   Can travel by private vehicle        Equipment Recommendations  Rolling walker (2 wheels)    Recommendations for Other Services       Precautions / Restrictions Precautions Precautions: Fall Recall of Precautions/Restrictions: Intact Restrictions Weight Bearing Restrictions Per Provider Order: No     Mobility  Bed Mobility               General bed mobility comments: In recliner    Transfers Overall transfer level: Needs assistance Equipment used: 1 person hand held assist Transfers: Sit to/from Stand Sit to Stand: Min assist           General transfer comment: Light Min A to power up.    Ambulation/Gait Ambulation/Gait assistance: Min assist, Mod assist Gait Distance (Feet): 150 Feet Assistive device: 1 person hand held assist Gait Pattern/deviations: Step-through pattern, Decreased step length - right, Narrow base of support, Scissoring, Drifts right/left       General Gait Details: Started off with steady gait however once fatigued became more unsteady with occasional scissoring gait and drifting. Able to complete DGI.   Stairs Stairs: Yes Stairs assistance: Contact guard assist, Min assist Stair  Management: One rail Left, Alternating pattern, Step to pattern, Forwards Number of Stairs: 2 General stair comments: CGA going up and Min A coming down. Cued for step to pattern.   Wheelchair Mobility     Tilt Bed    Modified Rankin (Stroke Patients Only)       Balance Overall balance assessment: Needs assistance Sitting-balance support: No upper extremity supported, Feet supported Sitting balance-Leahy Scale: Good     Standing balance support: Bilateral upper extremity supported, During functional activity, Reliant on assistive device for balance Standing balance-Leahy Scale: Poor                   Standardized Balance Assessment Standardized Balance Assessment : Dynamic Gait Index   Dynamic Gait Index Level Surface: Moderate Impairment Change in Gait Speed: Severe Impairment Gait with Horizontal Head Turns: Moderate Impairment Gait with Vertical Head Turns: Moderate Impairment Gait and Pivot Turn: Moderate Impairment Step Over Obstacle: Moderate Impairment Step Around Obstacles: Moderate Impairment Steps: Moderate Impairment Total Score: 7      Communication Communication Communication: No apparent difficulties  Cognition Arousal: Alert Behavior During Therapy: WFL for tasks assessed/performed   PT - Cognitive impairments: Problem solving, Safety/Judgement                         Following commands: Intact      Cueing Cueing Techniques: Verbal cues  Exercises      General Comments General comments (skin integrity, edema, etc.): VSS  Pertinent Vitals/Pain Pain Assessment Pain Assessment: Faces Faces Pain Scale: Hurts little more Pain Location: head Pain Descriptors / Indicators: Headache Pain Intervention(s): Premedicated before session, Limited activity within patient's tolerance, Monitored during session, Repositioned    Home Living                          Prior Function            PT Goals (current goals  can now be found in the care plan section) Progress towards PT goals: Progressing toward goals    Frequency    Min 3X/week      PT Plan      Co-evaluation              AM-PAC PT 6 Clicks Mobility   Outcome Measure  Help needed turning from your back to your side while in a flat bed without using bedrails?: A Little Help needed moving from lying on your back to sitting on the side of a flat bed without using bedrails?: A Little Help needed moving to and from a bed to a chair (including a wheelchair)?: A Lot Help needed standing up from a chair using your arms (e.g., wheelchair or bedside chair)?: A Little Help needed to walk in hospital room?: A Lot Help needed climbing 3-5 steps with a railing? : Total 6 Click Score: 14    End of Session Equipment Utilized During Treatment: Gait belt Activity Tolerance: Patient tolerated treatment well Patient left: in chair;with call bell/phone within reach;with chair alarm set;with family/visitor present Nurse Communication: Mobility status PT Visit Diagnosis: Unsteadiness on feet (R26.81);Muscle weakness (generalized) (M62.81);Difficulty in walking, not elsewhere classified (R26.2);Hemiplegia and hemiparesis Hemiplegia - Right/Left: Right Hemiplegia - dominant/non-dominant: Dominant Hemiplegia - caused by: Nontraumatic intracerebral hemorrhage     Time: 8561-8547 PT Time Calculation (min) (ACUTE ONLY): 14 min  Charges:    $Gait Training: 8-22 mins PT General Charges $$ ACUTE PT VISIT: 1 Visit                     Sueellen NOVAK, PT, DPT Acute Rehab Services [phone]    Hailey [name] 02/24/2024, 3:22 PM

## 2024-02-24 NOTE — Progress Notes (Signed)
 IP rehab admissions - we have insurance approval for CIR admission, but no bed available for this patient today.  Will follow up tomorrow.  [phone]

## 2024-02-24 NOTE — Progress Notes (Signed)
 Occupational Therapy Treatment Patient Details Name: Hailey [name] MRN: [id number] DOB: 06-13-1965 Today's Date: 02/24/2024   History of present illness 59 yo F adm 1/17 with CT (+) L parietal lobe ICH and central venous sinus thrombosis noted to have seizure activity NIH 1 PMH HTN HLD DM TIA migraines   OT comments  Patient seen in order provide education and exercises with regard to RUE. Patient reports decrease in tingling, but reports sensation is still off. Patient with decreased strength and decreased coordination in [facility] and [facility] movements. Green sponge provided for grip and completing activity with medicine cups for [facility]. OT educated on other activities to work on [facility], with patient in agreement. Patient also working on functional reaching to simulate various work scenarios at end of session. OT recommendation remains approrpiate, will continue to follow acutely.       If plan is discharge home, recommend the following:  A little help with walking and/or transfers;A little help with bathing/dressing/bathroom;Assistance with cooking/housework;Assist for transportation;Help with stairs or ramp for entrance   Equipment Recommendations  BSC/3in1    Recommendations for Other Services      Precautions / Restrictions Precautions Precautions: Fall Recall of Precautions/Restrictions: Intact Restrictions Weight Bearing Restrictions Per Provider Order: No       Mobility Bed Mobility Overal bed mobility: Needs Assistance Bed Mobility: Supine to Sit     Supine to sit: Contact guard, HOB elevated          Transfers Overall transfer level: Needs assistance Equipment used: Rolling walker (2 wheels) Transfers: Sit to/from Stand Sit to Stand: Contact guard assist           General transfer comment: CGA for transfer     Balance Overall balance assessment: Needs assistance Sitting-balance support: No upper extremity supported, Feet supported Sitting balance-Leahy Scale: Good      Standing balance support: Bilateral upper extremity supported, During functional activity, Reliant on assistive device for balance Standing balance-Leahy Scale: Poor                             ADL either performed or assessed with clinical judgement   ADL Overall ADL's : Needs assistance/impaired                         Toilet Transfer: Contact guard assist;Ambulation;Rolling walker (2 wheels)             General ADL Comments: Patient seen in order provide education and exercises with regard to RUE. Patient reports decrease in tingling, but reports sensation is still off. Patient with decreased strength and decreased coordination in [facility] and [facility] movements. Green sponge provided for grip and completing activity with medicine cups for [facility]. OT educated on other activities to work on [facility], with patient in agreement. OT recommendation remains approrpiate, will continue to follow acutely.    Extremity/Trunk Assessment Upper Extremity Assessment Upper Extremity Assessment: RUE deficits/detail RUE Deficits / Details: no further tingling, but decreased sensation, grip and coordination RUE Sensation: decreased light touch RUE Coordination: decreased fine motor;decreased gross motor            Vision       Perception     Praxis     Communication Communication Communication: No apparent difficulties   Cognition Arousal: Alert Behavior During Therapy: WFL for tasks assessed/performed Cognition: No apparent impairments  Following commands: Intact        Cueing   Cueing Techniques: Verbal cues  Exercises Exercises: Other exercises Other Exercises Other Exercises: gripping and pinching green foam block Other Exercises: transferring medicine cups with R hand L to R 2 sets of 10 Other Exercises: functional reaching across x7 Other Exercises: functional reaching up x7    Shoulder Instructions       General  Comments      Pertinent Vitals/ Pain       Pain Assessment Pain Assessment: Faces Faces Pain Scale: Hurts a little bit Pain Location: head Pain Descriptors / Indicators: Headache Pain Intervention(s): Limited activity within patient's tolerance, Monitored during session, Repositioned, Premedicated before session  Home Living                                          Prior Functioning/Environment              Frequency  Min 2X/week        Progress Toward Goals  OT Goals(current goals can now be found in the care plan section)  Progress towards OT goals: Progressing toward goals  Acute Rehab OT Goals Patient Stated Goal: to get to rehab OT Goal Formulation: With patient/family Time For Goal Achievement: 03/06/24 Potential to Achieve Goals: Good  Plan      Co-evaluation                 AM-PAC OT 6 Clicks Daily Activity     Outcome Measure   Help from another person eating meals?: A Little Help from another person taking care of personal grooming?: A Little Help from another person toileting, which includes using toliet, bedpan, or urinal?: A Lot Help from another person bathing (including washing, rinsing, drying)?: A Lot Help from another person to put on and taking off regular upper body clothing?: A Little Help from another person to put on and taking off regular lower body clothing?: A Little 6 Click Score: 16    End of Session    OT Visit Diagnosis: Unsteadiness on feet (R26.81);Muscle weakness (generalized) (M62.81)   Activity Tolerance Patient tolerated treatment well   Patient Left in chair;with call bell/phone within reach;with chair alarm set   Nurse Communication Mobility status;Precautions        Time: 8661-8579 OT Time Calculation (min): 42 min  Charges: OT General Charges $OT Visit: 1 Visit OT Treatments $Therapeutic Activity: 38-52 mins  Ronal Gift E. Aloysious Vangieson, OTR/L Acute Rehabilitation  Services [phone]   Ronal Gift Salt 02/24/2024, 2:49 PM

## 2024-02-24 NOTE — Progress Notes (Addendum)
 PHARMACY - ANTICOAGULATION CONSULT NOTE  Pharmacy Consult for Heparin >>apixaban  Indication: superior sagittal sinus thrombosis  Allergies[1]  Patient Measurements: Height: 5' 1 (154.9 cm) Weight: 99.9 kg (220 lb 3.8 oz) IBW/kg (Calculated) : 47.8 HEPARIN  DW (KG): 71.8  Vital Signs: Temp: 97.7 F (36.5 C) (01/21 0739) Temp Source: Oral (01/21 0739) BP: 113/61 (01/21 0739) Pulse Rate: 59 (01/21 0739)  Labs: Recent Labs    02/22/24 0455 02/23/24 0441 02/24/24 0147  HGB 12.7 12.7  --   HCT 37.7 37.8  --   PLT 254 241  --   HEPARINUNFRC 0.31 0.44 0.46  CREATININE 0.66 0.72  --     Estimated Creatinine Clearance: 82 mL/min (by C-G formula based on SCr of 0.72 mg/dL).   Medical History: Past Medical History:  Diagnosis Date   Angina at rest    Anxiety    Chest pain    Diabetes mellitus without complication (HCC)    High cholesterol    High cholesterol    History of angina    History of TIA (transient ischemic attack) 10/11/2020   Hypercholesterolemia 10/11/2020   Hyperlipidemia    Migraine    Personal history of COVID-19 10/11/2020   Radiculopathy of cervical region    TIA (transient ischemic attack)    Transient cerebral ischemic attack    Type II diabetes mellitus (HCC) 10/11/2020     Assessment: 59 yo female presents with L Parietal hemorrhage/SAH, partially occlusive superior saggital sinus thrombosis. Pharmacy consulted to dose heparin  neuro protocol.  HL therapeutic this AM. Continue current rate until ready for PO anticoagulation.   Addendum  Ok to transition to apixaban  today.   Goal of Therapy:  Monitor platelets by anticoagulation protocol: Yes   Plan:  Dc heparin  Apixaban  5mg  PO BID  Sergio Batch, PharmD, BCIDP, AAHIVP, CPP Infectious Disease Pharmacist 02/24/2024 7:53 AM         [1]  Allergies Allergen Reactions   Hazelnut (Filbert) Anaphylaxis   Penicillins Anaphylaxis   Coconut (Cocos Nucifera)    Cocos Nucifera    Ozempic  (0.25 Or 0.5 Mg-Dose) [Semaglutide(0.25 Or 0.5mg -Dos)] Rash   Rosuvastatin  Other (See Comments)    Dizzy per pt   Tirzepatide Rash

## 2024-02-25 ENCOUNTER — Inpatient Hospital Stay (HOSPITAL_COMMUNITY)
Admission: AD | Admit: 2024-02-25 | Discharge: 2024-03-03 | DRG: 057 | Disposition: A | Source: Intra-hospital | Attending: Physical Medicine and Rehabilitation | Admitting: Physical Medicine and Rehabilitation

## 2024-02-25 ENCOUNTER — Other Ambulatory Visit: Payer: Self-pay

## 2024-02-25 ENCOUNTER — Encounter (HOSPITAL_COMMUNITY): Payer: Self-pay | Admitting: Physical Medicine and Rehabilitation

## 2024-02-25 DIAGNOSIS — E119 Type 2 diabetes mellitus without complications: Secondary | ICD-10-CM | POA: Diagnosis not present

## 2024-02-25 DIAGNOSIS — I619 Nontraumatic intracerebral hemorrhage, unspecified: Secondary | ICD-10-CM | POA: Diagnosis present

## 2024-02-25 DIAGNOSIS — I611 Nontraumatic intracerebral hemorrhage in hemisphere, cortical: Secondary | ICD-10-CM | POA: Diagnosis not present

## 2024-02-25 DIAGNOSIS — Z794 Long term (current) use of insulin: Secondary | ICD-10-CM

## 2024-02-25 DIAGNOSIS — M51369 Other intervertebral disc degeneration, lumbar region without mention of lumbar back pain or lower extremity pain: Secondary | ICD-10-CM | POA: Diagnosis present

## 2024-02-25 DIAGNOSIS — G441 Vascular headache, not elsewhere classified: Secondary | ICD-10-CM

## 2024-02-25 DIAGNOSIS — G40909 Epilepsy, unspecified, not intractable, without status epilepticus: Secondary | ICD-10-CM | POA: Diagnosis not present

## 2024-02-25 DIAGNOSIS — E7800 Pure hypercholesterolemia, unspecified: Secondary | ICD-10-CM | POA: Diagnosis present

## 2024-02-25 DIAGNOSIS — F419 Anxiety disorder, unspecified: Secondary | ICD-10-CM | POA: Diagnosis present

## 2024-02-25 DIAGNOSIS — K219 Gastro-esophageal reflux disease without esophagitis: Secondary | ICD-10-CM | POA: Diagnosis present

## 2024-02-25 DIAGNOSIS — E559 Vitamin D deficiency, unspecified: Secondary | ICD-10-CM | POA: Diagnosis present

## 2024-02-25 DIAGNOSIS — R519 Headache, unspecified: Secondary | ICD-10-CM | POA: Insufficient documentation

## 2024-02-25 DIAGNOSIS — I10 Essential (primary) hypertension: Secondary | ICD-10-CM | POA: Diagnosis present

## 2024-02-25 DIAGNOSIS — Z2989 Encounter for other specified prophylactic measures: Secondary | ICD-10-CM

## 2024-02-25 LAB — FACTOR 5 LEIDEN

## 2024-02-25 LAB — CBC
HCT: 38.8 % (ref 36.0–46.0)
Hemoglobin: 13.1 g/dL (ref 12.0–15.0)
MCH: 28.1 pg (ref 26.0–34.0)
MCHC: 33.8 g/dL (ref 30.0–36.0)
MCV: 83.1 fL (ref 80.0–100.0)
Platelets: 297 K/uL (ref 150–400)
RBC: 4.67 MIL/uL (ref 3.87–5.11)
RDW: 14.4 % (ref 11.5–15.5)
WBC: 9 K/uL (ref 4.0–10.5)
nRBC: 0 % (ref 0.0–0.2)

## 2024-02-25 LAB — BETA-2-GLYCOPROTEIN I ABS, IGG/M/A
Beta-2 Glyco I IgG: <9 GPI IgG units (ref 0–20)
Beta-2-Glycoprotein I IgA: <9 GPI IgA units (ref 0–25)
Beta-2-Glycoprotein I IgM: <9 GPI IgM units (ref 0–32)

## 2024-02-25 LAB — GLUCOSE, CAPILLARY
Glucose-Capillary: 103 mg/dL — ABNORMAL HIGH (ref 70–99)
Glucose-Capillary: 131 mg/dL — ABNORMAL HIGH (ref 70–99)
Glucose-Capillary: 137 mg/dL — ABNORMAL HIGH (ref 70–99)
Glucose-Capillary: 172 mg/dL — ABNORMAL HIGH (ref 70–99)

## 2024-02-25 LAB — PROTHROMBIN GENE MUTATION

## 2024-02-25 MED ORDER — APIXABAN 5 MG PO TABS
5.0000 mg | ORAL_TABLET | Freq: Two times a day (BID) | ORAL | Status: DC
  Administered 2024-02-25 – 2024-03-03 (×14): 5 mg via ORAL
  Filled 2024-02-25 (×15): qty 1

## 2024-02-25 MED ORDER — ACETAMINOPHEN 325 MG PO TABS
650.0000 mg | ORAL_TABLET | Freq: Four times a day (QID) | ORAL | Status: DC | PRN
  Administered 2024-02-29 – 2024-03-03 (×7): 650 mg via ORAL
  Filled 2024-02-25 (×7): qty 2

## 2024-02-25 MED ORDER — ATORVASTATIN CALCIUM 40 MG PO TABS
40.0000 mg | ORAL_TABLET | Freq: Every day | ORAL | Status: DC
  Administered 2024-02-26: 40 mg via ORAL
  Filled 2024-02-25: qty 1

## 2024-02-25 MED ORDER — BUTALBITAL-APAP-CAFFEINE 50-325-40 MG PO TABS
1.0000 | ORAL_TABLET | Freq: Four times a day (QID) | ORAL | Status: DC | PRN
  Administered 2024-02-25 – 2024-02-28 (×4): 1 via ORAL
  Filled 2024-02-25 (×4): qty 1

## 2024-02-25 MED ORDER — TOPIRAMATE 25 MG PO TABS
50.0000 mg | ORAL_TABLET | Freq: Two times a day (BID) | ORAL | Status: DC
  Administered 2024-02-25 – 2024-03-03 (×14): 50 mg via ORAL
  Filled 2024-02-25 (×15): qty 2

## 2024-02-25 MED ORDER — TOPIRAMATE 50 MG PO TABS: 50.0000 mg | ORAL_TABLET | Freq: Two times a day (BID) | ORAL | 0 refills | Status: DC

## 2024-02-25 MED ORDER — BISACODYL 10 MG RE SUPP: 10.0000 mg | Freq: Every day | RECTAL | Status: DC | PRN

## 2024-02-25 MED ORDER — GABAPENTIN 100 MG PO CAPS
100.0000 mg | ORAL_CAPSULE | Freq: Three times a day (TID) | ORAL | Status: AC
  Administered 2024-02-25 (×2): 100 mg via ORAL
  Filled 2024-02-25 (×2): qty 1

## 2024-02-25 MED ORDER — SENNOSIDES-DOCUSATE SODIUM 8.6-50 MG PO TABS
1.0000 | ORAL_TABLET | Freq: Two times a day (BID) | ORAL | Status: DC
  Administered 2024-02-26 – 2024-03-03 (×13): 1 via ORAL
  Filled 2024-02-25 (×15): qty 1

## 2024-02-25 MED ORDER — DIPHENHYDRAMINE HCL 50 MG/ML IJ SOLN
25.0000 mg | Freq: Once | INTRAMUSCULAR | Status: AC
  Administered 2024-02-25: 25 mg via INTRAVENOUS
  Filled 2024-02-25: qty 1

## 2024-02-25 MED ORDER — ORAL CARE MOUTH RINSE: 15.0000 mL | OROMUCOSAL | Status: DC | PRN

## 2024-02-25 MED ORDER — TOPIRAMATE 25 MG PO TABS
50.0000 mg | ORAL_TABLET | Freq: Two times a day (BID) | ORAL | Status: DC
  Administered 2024-02-25: 50 mg via ORAL
  Filled 2024-02-25: qty 2

## 2024-02-25 MED ORDER — ACETAMINOPHEN 325 MG PO TABS: 325.0000 mg | ORAL_TABLET | ORAL | Status: DC | PRN

## 2024-02-25 MED ORDER — METOCLOPRAMIDE HCL 5 MG/ML IJ SOLN
10.0000 mg | Freq: Once | INTRAMUSCULAR | Status: AC
  Administered 2024-02-25: 10 mg via INTRAVENOUS
  Filled 2024-02-25: qty 2

## 2024-02-25 MED ORDER — DIPHENHYDRAMINE HCL 25 MG PO CAPS: 25.0000 mg | ORAL_CAPSULE | Freq: Four times a day (QID) | ORAL | Status: DC | PRN

## 2024-02-25 MED ORDER — PROCHLORPERAZINE EDISYLATE 10 MG/2ML IJ SOLN: 5.0000 mg | Freq: Four times a day (QID) | INTRAMUSCULAR | Status: DC | PRN

## 2024-02-25 MED ORDER — LEVETIRACETAM 1000 MG PO TABS: 1000.0000 mg | ORAL_TABLET | Freq: Two times a day (BID) | ORAL | 0 refills | Status: AC

## 2024-02-25 MED ORDER — TRAZODONE HCL 50 MG PO TABS
25.0000 mg | ORAL_TABLET | Freq: Every evening | ORAL | Status: DC | PRN
  Administered 2024-03-01 – 2024-03-02 (×2): 50 mg via ORAL
  Filled 2024-02-25 (×2): qty 1

## 2024-02-25 MED ORDER — PROCHLORPERAZINE 25 MG RE SUPP: 12.5000 mg | Freq: Four times a day (QID) | RECTAL | Status: DC | PRN

## 2024-02-25 MED ORDER — INSULIN ASPART 100 UNIT/ML IJ SOLN
0.0000 [IU] | Freq: Three times a day (TID) | INTRAMUSCULAR | Status: DC
  Administered 2024-02-25 – 2024-02-26 (×3): 3 [IU] via SUBCUTANEOUS
  Administered 2024-02-28: 4 [IU] via SUBCUTANEOUS
  Administered 2024-02-29: 3 [IU] via SUBCUTANEOUS
  Filled 2024-02-25: qty 4
  Filled 2024-02-25: qty 3

## 2024-02-25 MED ORDER — VITAMIN C 500 MG PO TABS
1000.0000 mg | ORAL_TABLET | Freq: Every day | ORAL | Status: DC
  Administered 2024-02-25 – 2024-03-03 (×8): 1000 mg via ORAL
  Filled 2024-02-25 (×9): qty 2

## 2024-02-25 MED ORDER — GUAIFENESIN-DM 100-10 MG/5ML PO SYRP: 5.0000 mL | ORAL_SOLUTION | Freq: Four times a day (QID) | ORAL | Status: DC | PRN

## 2024-02-25 MED ORDER — LEVETIRACETAM 250 MG PO TABS
1000.0000 mg | ORAL_TABLET | Freq: Two times a day (BID) | ORAL | Status: DC
  Administered 2024-02-25 – 2024-03-03 (×14): 1000 mg via ORAL
  Filled 2024-02-25 (×15): qty 4

## 2024-02-25 MED ORDER — ATORVASTATIN CALCIUM 40 MG PO TABS: 40.0000 mg | ORAL_TABLET | Freq: Every day | ORAL | 0 refills | Status: AC

## 2024-02-25 MED ORDER — FLEET ENEMA RE ENEM: 1.0000 | ENEMA | Freq: Once | RECTAL | Status: DC | PRN

## 2024-02-25 MED ORDER — APIXABAN 5 MG PO TABS: 5.0000 mg | ORAL_TABLET | Freq: Two times a day (BID) | ORAL | 0 refills | Status: AC

## 2024-02-25 MED ORDER — ALUM & MAG HYDROXIDE-SIMETH 200-200-20 MG/5ML PO SUSP: 30.0000 mL | ORAL | Status: DC | PRN

## 2024-02-25 MED ORDER — PROCHLORPERAZINE MALEATE 5 MG PO TABS: 5.0000 mg | ORAL_TABLET | Freq: Four times a day (QID) | ORAL | Status: DC | PRN

## 2024-02-25 MED ORDER — INSULIN ASPART 100 UNIT/ML IJ SOLN
0.0000 [IU] | Freq: Every day | INTRAMUSCULAR | Status: DC
  Administered 2024-02-25: 4 [IU] via SUBCUTANEOUS
  Filled 2024-02-25: qty 4

## 2024-02-25 MED ORDER — DEXAMETHASONE SODIUM PHOSPHATE 4 MG/ML IJ SOLN
4.0000 mg | Freq: Once | INTRAMUSCULAR | Status: AC
  Administered 2024-02-25: 4 mg via INTRAVENOUS
  Filled 2024-02-25: qty 1

## 2024-02-25 NOTE — TOC Transition Note (Signed)
 Transition of Care [facility]) - Discharge Note   Patient Details  Name: Hailey [name] MRN: [id number] Date of Birth: 08-Aug-1965  Transition of Care [facility]) CM/SW Contact:  Andrez JULIANNA George, RN Phone Number: 02/25/2024, 9:50 AM   Clinical Narrative:     Pt is discharging to CIR today. ICM signing off.   Final next level of care: IP Rehab Facility Barriers to Discharge: No Barriers Identified   Patient Goals and CMS Choice Patient states their goals for this hospitalization and ongoing recovery are:: Rehab          Discharge Placement                       Discharge Plan and Services Additional resources added to the After Visit Summary for       Post Acute Care Choice: IP Rehab                               Social Drivers of Health (SDOH) Interventions SDOH Screenings   Food Insecurity: No Food Insecurity (02/22/2024)  Housing: Low Risk (02/22/2024)  Transportation Needs: No Transportation Needs (02/22/2024)  Utilities: Not At Risk (02/22/2024)  Tobacco Use: Low Risk (02/24/2024)     Readmission Risk Interventions     No data to display

## 2024-02-25 NOTE — Plan of Care (Signed)
" °  Problem: Consults Goal: RH BRAIN INJURY PATIENT EDUCATION Description: Description: See Patient Education module for eduction specifics Outcome: Progressing   Problem: RH SAFETY Goal: RH STG ADHERE TO SAFETY PRECAUTIONS W/ASSISTANCE/DEVICE Description: STG Adhere to Safety Precautions With cues Assistance/Device. Outcome: Progressing   "

## 2024-02-25 NOTE — Progress Notes (Addendum)
 Inpatient Rehab Admissions Coordinator:   I have a bed available for this patient to admit to CIR today.  Dr. Jerri in agreement and [facility] aware.  I will let pt/family know and make arrangements.   950: updated spouse via voicemail  Reche Lowers, George, DPT Admissions Coordinator [phone] 02/25/24 9:49 AM

## 2024-02-25 NOTE — Progress Notes (Signed)
 Inpatient Rehabilitation Admission Medication Review by a Pharmacist  A complete drug regimen review was completed for this patient to identify any potential clinically significant medication issues.  High Risk Drug Classes Is patient taking? Indication by Medication  Antipsychotic Yes Compazine  - nausea and vomiting  Anticoagulant Yes Apixaban  -venous thrombosis  Antibiotic No   Opioid No   Antiplatelet No   Hypoglycemics/insulin  Yes SSI - hyperglycemia  Vasoactive Medication No   Chemotherapy No   Other Yes Atorvastatin  - HLD Gabapentin  trial - HA Keppra  - seizure Trazadone - sleep Vit C - supplementation Senna -S - constipation     Type of Medication Issue Identified Description of Issue Recommendation(s)  Drug Interaction(s) (clinically significant)     Duplicate Therapy     Allergy     No Medication Administration End Date     Incorrect Dose     Additional Drug Therapy Needed  Fiorecet alternating with Tylenol  prn - for intractable headache Consider restarting  Significant med changes from prior encounter (inform family/care partners about these prior to discharge). Epi-Pen at home for anaphylaxis  Consider restarting on discharge  Other       Clinically significant medication issues were identified that warrant physician communication and completion of prescribed/recommended actions by midnight of the next day: No  Name of provider notified for urgent issues identified:   Provider Method of Notification:     Pharmacist comments:   Time spent performing this drug regimen review (minutes):  20   Dorothyann DELENA Alert 02/25/2024 1:54 PM

## 2024-02-25 NOTE — Telephone Encounter (Signed)
 Spoke to husband  Pt is in the inpatient rehab unit now     Will be there for 2 to 3 wks  Please make appt for her to see me at end of March

## 2024-02-25 NOTE — Discharge Instructions (Signed)

## 2024-02-25 NOTE — Progress Notes (Signed)
 "    Babs Arthea DASEN, MD  Physician Physical Medicine and Rehabilitation   PMR Pre-admission    Signed   Date of Service: 02/25/2024  9:51 AM  Related encounter: ED to Hosp-Admission (Current) from 02/20/2024 in [facility]   Signed     Expand All Collapse All  PMR Admission Coordinator Pre-Admission Assessment   Patient: Hailey [name] is an 59 y.o., female MRN: [id number] DOB: [birth year] Height: 5' 1 (154.9 cm) Weight: 99.9 kg   Insurance Information HMO:     PPO:      PCP:      IPA:      80/20:      OTHER: EPO PRIMARY: [employer] NAP      Policy#: [id number]      Subscriber: pt CM Name: Cathlyn      Phone#: [phone]     Fax#: [phone] Pre-Cert#: [id number] auth for CIR from Cathlyn with [employer] UR for admit 1/21 with next review date 1/27.  Updates due to Greg at fax listed above.        Employer: [employer] Benefits:  Phone #: [phone]     Name:  Eff. Date: 02/04/23     Deduct: $250 (met)      Out of Pocket Max: $1500 ($0 met)    Life Max:  CIR: 90%      SNF: 90% Outpatient: 90%     Co-Pay: 10% Home Health: 90%      Co-Pay: 10% DME: 90%     Co-Pay: 10% Providers:  SECONDARY:       Policy#:      Phone#:    Artist:       Phone#:    The Best Boy for patients in Inpatient Rehabilitation Facilities with attached Privacy Act Statement-Health Care Records was provided and verbally reviewed with: Patient and Family   Emergency Contact Information Contact Information       Name Relation Home Work Mobile    [place] Spouse     [phone]         Other Contacts   None on File        Current Medical History  Patient Admitting Diagnosis: ICH    History of Present Illness: Pt is a 59 y/o female with PMH of HLD, DM, TIA, and migraines who admitted to [facility] on 02/20/24 from work with RLE hemiparesis and disorientation.  Code stroke activated by EDP.  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  She was noted to have rhythmic RLE twitching concerning for focal seizure and loaded with Keppra , ativan , and vimpat  with resolution.  ICH did not seem to confine to a single vessel distribution so CTA/CTV completed which showed partially occlusive superior sagittal sinus thrombosis.  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  She had serial CTs which showed stable hematoma.  Hypercoagulable workup is pending.  LTM EEG showed no seizures.  She is on heparin  with anticoagulation plan pending.  She did require cleviprex  for BP control.  She will continue on Keppra  BID.  Therapy ongoing and pt has been recommended for CIR.    Complete NIHSS TOTAL: 2   Patient's medical record from [name] has been reviewed by the rehabilitation admission coordinator and physician.   Past Medical History      Past Medical History:  Diagnosis Date   Angina at rest     Anxiety  Chest pain     Diabetes mellitus without complication (HCC)     High cholesterol     High cholesterol     History of angina     History of TIA (transient ischemic attack) 10/11/2020   Hypercholesterolemia 10/11/2020   Hyperlipidemia     Migraine     Personal history of COVID-19 10/11/2020   Radiculopathy of cervical region     TIA (transient ischemic attack)     Transient cerebral ischemic attack     Type II diabetes mellitus (HCC) 10/11/2020          Has the patient had major surgery during 100 days prior to admission? No   Family History   family history includes Breast cancer in her maternal aunt and paternal aunt.   Current Medications [Current Medications]  [Current Medications]    Current Facility-Administered Medications:    acetaminophen  (TYLENOL ) tablet 650 mg, 650 mg, Oral, Q4H PRN, 650 mg at 02/23/24 0048 **OR** acetaminophen  (TYLENOL ) 160 MG/5ML solution 650 mg, 650 mg, Per Tube, Q4H PRN **OR** acetaminophen  (TYLENOL ) suppository  650 mg, 650 mg, Rectal, Q4H PRN, Lehner, Erin C, NP   atorvastatin  (LIPITOR) tablet 40 mg, 40 mg, Oral, Daily, Jerri Pfeiffer, MD, 40 mg at 02/23/24 1013   butalbital -acetaminophen -caffeine  (FIORICET ) 50-325-40 MG per tablet 1 tablet, 1 tablet, Oral, Q8H PRN, Jerri Pfeiffer, MD, 1 tablet at 02/23/24 0446   Chlorhexidine  Gluconate Cloth 2 % PADS 6 each, 6 each, Topical, Daily, Khaliqdina, Salman, MD, 6 each at 02/22/24 1238   gabapentin  (NEURONTIN ) capsule 100 mg, 100 mg, Oral, TID, Jerri Pfeiffer, MD, 100 mg at 02/23/24 1013   heparin  ADULT infusion 100 units/mL (25000 units/250mL), 1,250 Units/hr, Intravenous, Continuous, Merilee Linsey I, RPH, Last Rate: 12.5 mL/hr at 02/23/24 9277, 1,250 Units/hr at 02/23/24 9277   insulin  aspart (novoLOG ) injection 0-20 Units, 0-20 Units, Subcutaneous, TID WC, Khaliqdina, Salman, MD, 4 Units at 02/22/24 1140   insulin  aspart (novoLOG ) injection 0-5 Units, 0-5 Units, Subcutaneous, QHS, Khaliqdina, Salman, MD   labetalol  (NORMODYNE ) injection 20 mg, 20 mg, Intravenous, Once **AND** [DISCONTINUED] clevidipine  (CLEVIPREX ) infusion 0.5 mg/mL, 0-21 mg/hr, Intravenous, Continuous, Judithe Longs C, NP, Held at 02/20/24 1612   levETIRAcetam  (KEPPRA ) undiluted injection 1,000 mg, 1,000 mg, Intravenous, BID, 1,000 mg at 02/21/24 1015 **OR** levETIRAcetam  (KEPPRA ) tablet 1,000 mg, 1,000 mg, Oral, BID, Khaliqdina, Salman, MD, 1,000 mg at 02/23/24 1019   ondansetron  (ZOFRAN ) injection 4 mg, 4 mg, Intravenous, Q6H PRN, Jerri Pfeiffer, MD, 4 mg at 02/22/24 0746   Oral care mouth rinse, 15 mL, Mouth Rinse, PRN, Khaliqdina, Salman, MD   senna-docusate (Senokot-S) tablet 1 tablet, 1 tablet, Oral, BID, Judithe Longs C, NP, 1 tablet at 02/23/24 1013    Patients Current Diet:  Diet Order                  Diet heart healthy/carb modified Room service appropriate? Yes; Fluid consistency: Thin  Diet effective now                         Precautions /  Restrictions Precautions Precautions: Fall Precaution/Restrictions Comments: EEG running continuous Restrictions Weight Bearing Restrictions Per Provider Order: No    Has the patient had 2 or more falls or a fall with injury in the past year? 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 Independent   Indoor Mobility: Did the patient need assistance with walking from room to room (with or without device)? Independent   Stairs: Did the patient need assistance with internal or external stairs (with or without device)? Independent   Functional Cognition: Did the patient need help planning regular tasks such as shopping or remembering to take medications? Independent   Patient Information Are you of Hispanic, Latino/a,or Spanish origin?: A. No, not of Hispanic, Latino/a, or Spanish origin What is your race?: A. White Do you need or want an interpreter to communicate with a doctor or health care staff?: 0. No   Patient's Response To:  Health Literacy and Transportation Is the patient able to respond to health literacy and transportation needs?: Yes Health Literacy - How often do you need to have someone help you when you read instructions, pamphlets, or other written material from your doctor or pharmacy?: Never In the past 12 months, has lack of transportation kept you from medical appointments or from getting medications?: No In the past 12 months, has lack of transportation kept you from meetings, work, or from getting things needed for daily living?: No   Home Assistive Devices / Equipment Home Equipment: Hand held shower head   Prior Device Use: Indicate devices/aids used by the patient prior to current illness, exacerbation or injury? None of the above   Current Functional Level Cognition    Arousal/Alertness: Awake/alert Overall Cognitive Status: Impaired/Different from baseline Orientation Level: Oriented X4 Attention: Sustained Sustained Attention: Appears intact Memory: Impaired Memory Impairment: Retrieval deficit Awareness: Impaired Awareness Impairment: Emergent impairment Problem Solving: Impaired Problem Solving Impairment: Verbal basic Executive Function: Reasoning Reasoning: Appears intact    Extremity Assessment (includes Sensation/Coordination)   Upper Extremity Assessment: Right hand dominant, RUE deficits/detail RUE Deficits / Details: pt noted to have decrease grasp 3 out 5 pt with decreased fine motor coordination reports its tingling and numb RUE Sensation: decreased light touch RUE Coordination: decreased fine motor, decreased gross motor  Lower Extremity Assessment: Defer to PT evaluation RLE Deficits / Details: knee extension 4/5, ankle DF 4/5; RLE Sensation:  (tingling) RLE Coordination: decreased gross motor     ADLs   Overall ADL's : Needs assistance/impaired Eating/Feeding: Minimal assistance Grooming: Minimal assistance Upper Body Bathing: Moderate assistance Lower Body Bathing: Maximal assistance Upper Body Dressing : Moderate assistance Lower Body Dressing: Maximal assistance General ADL Comments: pt needed (A) to thread sock and then pull up especially on the R side     Mobility   Overal bed mobility: Needs Assistance Bed Mobility: Supine to Sit Supine to sit: Contact guard, HOB elevated Sit to supine: Contact guard assist General bed mobility comments: increased time and effort     Transfers   Overall transfer level: Needs assistance Equipment used: Rolling walker (2 wheels) Transfers: Sit to/from Stand Sit to Stand: Min assist General transfer comment: assist to power up. Assist with R hand placement on RW.     Ambulation / Gait / Stairs / Wheelchair Mobility   Ambulation/Gait Ambulation/Gait assistance: Mod assist Gait  Distance (Feet): 12 Feet Assistive device: Rolling walker (2 wheels) Gait Pattern/deviations: Step-through pattern, Decreased step length - right, Steppage, Narrow base of support General Gait Details: unsteady gait. Assist to maintain balance. Assist to maintain R grip on RW. Gait velocity: decreased Gait velocity interpretation: <1.8 ft/sec, indicate of risk for recurrent falls     Posture / Balance Balance Overall balance assessment: Needs assistance Sitting-balance support: No upper extremity  supported, Feet supported Sitting balance-Leahy Scale: Good Standing balance support: Bilateral upper extremity supported, During functional activity, Reliant on assistive device for balance Standing balance-Leahy Scale: Poor     Special considerations/life events  Diabetic management yes    Previous Home Environment (from acute therapy documentation) Living Arrangements: Spouse/significant other, Other (Comment) (dog (71 lb lab Herlene))  Lives With: Spouse Available Help at Discharge: Family, Available 24 hours/day Type of Home: House Home Layout: One level Home Access: Stairs to enter Entrance Stairs-Rails: Right Entrance Stairs-Number of Steps: 2 Bathroom Shower/Tub: Health Visitor: Standard (vanity beside) Additional Comments: has a Programme Researcher, Broadcasting/film/video,   Discharge Living Setting Plans for Discharge Living Setting: Patient's home, Lives with (comment) (spouse and set up per spouse report) Type of Home at Discharge: House Discharge Home Layout: One level Discharge Home Access: Stairs to enter Entrance Stairs-Rails: Left Entrance Stairs-Number of Steps: 5-6 Discharge Bathroom Shower/Tub: Walk-in shower Discharge Bathroom Toilet: Standard Discharge Bathroom Accessibility: Yes How Accessible: Accessible via walker Does the patient have any problems obtaining your medications?: No   Social/Family/Support Systems Patient Roles: Spouse Anticipated Caregiver: spouse  [place] Anticipated Industrial/product Designer Information: [phone] Ability/Limitations of Caregiver: works during the day, will take time off or arrange caregivers Caregiver Availability: 24/7 Discharge Plan Discussed with Primary Caregiver: Yes Is Caregiver In Agreement with Plan?: Yes Does Caregiver/Family have Issues with Lodging/Transportation while Pt is in Rehab?: No   Goals Patient/Family Goal for Rehab: PT/OT/SLP supervision Expected length of stay: 10-12 days Additional Information: Discharge plan: discharge to pt's home where she will have 24/7 supervision from spouse and other caregivers Pt/Family Agrees to Admission and willing to participate: Yes Program Orientation Provided & Reviewed with Pt/Caregiver Including Roles  & Responsibilities: Yes Additional Information Needs: reviewed with spouse/pt HH therapy versus non-skilled caregivers and family responsibility to arrange caregivers   Decrease burden of Care through IP rehab admission: n/a   Possible need for SNF placement upon discharge: Not anticipated.  Plan for discharge back to pt's home at supervision level.  Spouse and other caregivers to provide 24/7 supervision.    Patient Condition: I have reviewed medical records from [facility], spoken with [facility] team, and patient and spouse. I met with patient at the bedside for inpatient rehabilitation assessment.  Patient will benefit from ongoing PT, OT, and SLP, can actively participate in 3 hours of therapy a day 5 days of the week, and can make measurable gains during the admission.  Patient will also benefit from the coordinated team approach during an Inpatient Acute Rehabilitation admission.  The patient will receive intensive therapy as well as Rehabilitation physician, nursing, social worker, and care management interventions.  Due to bladder management, bowel management, safety, skin/wound care, disease management, medication administration, pain management, and patient  education the patient requires 24 hour a day rehabilitation nursing.  The patient is currently mod assist of 2 with mobility and basic ADLs.  Discharge setting and therapy post discharge at home is anticipated.  Patient has agreed to participate in the Acute Inpatient Rehabilitation Program and will admit today.   Preadmission Screen Completed By:  Reche FORBES Lowers, PT, DPT 02/23/2024 11:56 AM ______________________________________________________________________   Discussed status with Dr. Babs on 02/25/24  at 9:51 AM  and received approval for admission today.   Admission Coordinator:  Wilkie Zenon E Jaclin Finks, PT, DPT time 9:51 AM Pattricia 02/25/24     Assessment/Plan: Diagnosis: left parietal ICH Does the need for close, 24 hr/day Medical supervision in concert with  the patient's rehab needs make it unreasonable for this patient to be served in a less intensive setting? Yes Co-Morbidities requiring supervision/potential complications: HTN, seizures, anxiety, DM Due to bladder management, bowel management, safety, skin/wound care, disease management, medication administration, pain management, and patient education, does the patient require 24 hr/day rehab nursing? Yes Does the patient require coordinated care of a physician, rehab nurse, PT, OT, and SLP to address physical and functional deficits in the context of the above medical diagnosis(es)? Yes Addressing deficits in the following areas: balance, endurance, locomotion, strength, transferring, bowel/bladder control, bathing, dressing, feeding, grooming, toileting, cognition, speech, language, swallowing, and psychosocial support Can the patient actively participate in an intensive therapy program of at least 3 hrs of therapy 5 days a week? Yes The potential for patient to make measurable gains while on inpatient rehab is excellent Anticipated functional outcomes upon discharge from inpatient rehab: supervision PT, supervision OT, supervision  SLP Estimated rehab length of stay to reach the above functional goals is: 10-12 days Anticipated discharge destination: Home 10. Overall Rehab/Functional Prognosis: excellent     MD Signature: Arthea IVAR Gunther, MD, [facility] Health Physical Medicine & Rehabilitation Medical Director Rehabilitation Services 02/25/2024              Revision History  Date/Time User Provider Type Action  02/25/2024 11:25 AM Gunther Arthea DASEN, MD Physician Sign  02/25/2024  9:51 AM Butler Reche BRAVO, PT Rehab Admission Coordinator Share  02/23/2024 11:56 AM Butler Reche BRAVO, PT Rehab Admission Coordinator Share  02/23/2024 11:05 AM Butler Reche BRAVO, PT Rehab Admission Coordinator Share   View Details Report        "

## 2024-02-25 NOTE — Progress Notes (Signed)
 Patient came up with telemetry device on. We took off and tubed it back to 3W

## 2024-02-25 NOTE — H&P (Signed)
 Physical Medicine and Rehabilitation Admission H&P     Cc: Functional deficits due to debility intracranial hemorrhage    HPI: Hailey [name] is a 59 year old female with PMHx of HTN, HLD, DM2, TIA, myocardial bridge and migraines, presented via EMS to [facility] on 02/20/24 with complaints of right lower extremity hemiparesis and disorientation.  Per chart review patient was at work when symptoms started, LKW around 1330.  Code stroke was activated by EDP and CTH revealed a left parietal lobe intracranial hemorrhage measuring 4.7 ml with adjacent SAH and sulcal effacement with no midline shift.  Patient was noted to have right leg rhythmic twitching concerning for focal seizure.  She was given IV Keppra  load, Ativan , and Vimpat  with resolution.  EEG revealed continuous slowing in the left hemisphere and generalized intermittent slowing suggestive of cortical dysfunction and no seizures or elliptic form discharges.  Keppra  continued twice daily. 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  Serial CT head scans showed stable hematoma.  Hypercoagulable workup pending, ANA and cardiolipin antibodies were negative.    For blood pressure control she received Cleviprex .  2D echo EF 55 to 60%.  Bilateral lower extremity ultrasound revealed no DVT.  VTE prophylaxis heparin  with transition to Eliquis  at discharge.  She was placed on statin therapy Lipitor 40 mg daily.  Prior to arrival the patient was independent without use of DME and working at [employer].  She lives with her spouse in a 1 level home, there are 3 steps to enter.  She currently requires mod assist +2 with mobility and basic ADLs, transfers with rolling walker. Therapy evaluations completed due to patient decreased functional mobility was admitted for a comprehensive rehab program.       Review of Systems  Constitutional: Negative.   Eyes: Negative.   Respiratory:  Negative.    Cardiovascular: Negative.   Gastrointestinal:  Negative for abdominal pain and constipation.  Genitourinary: Negative.   Neurological:  Positive for weakness and headaches. Negative for sensory change.                 Past Medical History:  Diagnosis Date   Angina at rest     Anxiety     Chest pain     Diabetes mellitus without complication (HCC)     High cholesterol     High cholesterol     History of angina     History of TIA (transient ischemic attack) 10/11/2020   Hypercholesterolemia 10/11/2020   Hyperlipidemia     Migraine     Personal history of COVID-19 10/11/2020   Radiculopathy of cervical region     TIA (transient ischemic attack)     Transient cerebral ischemic attack     Type II diabetes mellitus (HCC) 10/11/2020             Past Surgical History:  Procedure Laterality Date   BUNIONECTOMY       CHOLECYSTECTOMY                 Family History  Problem Relation Age of Onset   Breast cancer Maternal Aunt     Breast cancer Paternal Aunt          Social History:  reports that she has never smoked. She has never used smokeless tobacco. She reports that she does not drink alcohol and does not use drugs. Allergies: [Allergies]  [Allergies]      Allergen Reactions  Hazelnut (Filbert) Anaphylaxis   Penicillins Anaphylaxis   Coconut (Cocos Nucifera)     Cocos Nucifera     Ozempic (0.25 Or 0.5 Mg-Dose) [Semaglutide(0.25 Or 0.5mg -Dos)] Rash   Rosuvastatin  Other (See Comments)      Dizzy per pt   Tirzepatide Rash         Medications Prior to Admission  Medication Sig Dispense Refill   EPINEPHrine 0.3 MG/0.3ML SOSY Inject 0.3 mg as directed as needed (allergic reaction).       ondansetron  (ZOFRAN ) 4 MG tablet 1 tablet Orally Once a day as needed for nausea (Patient not taking: Reported on 02/21/2024)                  Home: Home Living Family/patient expects to be discharged to:: Private residence Living Arrangements:  Spouse/significant other, Other (Comment) (dog (71 lb lab Herlene)) Available Help at Discharge: Family, Available 24 hours/day Type of Home: House Home Access: Stairs to enter [employer] of Steps: 2 Entrance Stairs-Rails: Right Home Layout: One level Bathroom Shower/Tub: Health Visitor: Standard (vanity beside) Home Equipment: Hand held shower head Additional Comments: has a Programme Researcher, Broadcasting/film/video,  Lives With: Spouse   Functional History: Prior Function Prior Level of Function : Independent/Modified Independent, Working/employed, Driving ([employer]) Mobility Comments: works at [employer] Status:  Mobility: Bed Mobility Overal bed mobility: Needs Assistance Bed Mobility: Supine to Sit Supine to sit: Contact guard, HOB elevated Sit to supine: Contact guard assist General bed mobility comments: In recliner Transfers Overall transfer level: Needs assistance Equipment used: 1 person hand held assist Transfers: Sit to/from Stand Sit to Stand: Min assist General transfer comment: Light Min A to power up. Ambulation/Gait Ambulation/Gait assistance: Min assist, Mod assist Gait Distance (Feet): 150 Feet Assistive device: 1 person hand held assist Gait Pattern/deviations: Step-through pattern, Decreased step length - right, Narrow base of support, Scissoring, Drifts right/left General Gait Details: Started off with steady gait however once fatigued became more unsteady with occasional scissoring gait and drifting. Able to complete DGI. Gait velocity: decreased Gait velocity interpretation: <1.8 ft/sec, indicate of risk for recurrent falls Stairs: Yes Stairs assistance: Contact guard assist, Min assist Stair Management: One rail Left, Alternating pattern, Step to pattern, Forwards Number of Stairs: 2 General stair comments: CGA going up and Min A coming down. Cued for step to pattern.   ADL: ADL Overall ADL's : Needs assistance/impaired Eating/Feeding:  Minimal assistance Grooming: Minimal assistance Upper Body Bathing: Moderate assistance Lower Body Bathing: Maximal assistance Upper Body Dressing : Moderate assistance Lower Body Dressing: Maximal assistance Toilet Transfer: Contact guard assist, Ambulation, Rolling walker (2 wheels) General ADL Comments: Patient seen in order provide education and exercises with regard to RUE. Patient reports decrease in tingling, but reports sensation is still off. Patient with decreased strength and decreased coordination in [facility] and [facility] movements. Green sponge provided for grip and completing activity with medicine cups for [facility]. OT educated on other activities to work on [facility], with patient in agreement. OT recommendation remains approrpiate, will continue to follow acutely.   Cognition: Cognition Overall Cognitive Status: Impaired/Different from baseline Arousal/Alertness: Awake/alert Orientation Level: Oriented X4 Attention: Sustained Sustained Attention: Appears intact Memory: Impaired Memory Impairment: Retrieval deficit Awareness: Impaired Awareness Impairment: Emergent impairment Problem Solving: Impaired Problem Solving Impairment: Verbal basic Executive Function: Reasoning Reasoning: Appears intact Cognition Arousal: Alert Behavior During Therapy: WFL for tasks assessed/performed Overall Cognitive Status: Impaired/Different from baseline   Physical Exam: Blood pressure 118/64, pulse 67, temperature 98.5  F (36.9 C), temperature source Oral, resp. rate 19, height 5' 1 (1.549 m), weight 99.9 kg, SpO2 99%. Physical Exam Constitutional:      Appearance: She is obese.     Comments: Appear sl uncomfortable d/t headache  HENT:     Head: Normocephalic and atraumatic.     Right Ear: External ear normal.     Left Ear: External ear normal.     Nose: Nose normal.     Mouth/Throat:     Mouth: Mucous membranes are moist.     Pharynx: Oropharynx is clear.  Eyes:     Extraocular Movements:  Extraocular movements intact.     Pupils: Pupils are equal, round, and reactive to light.  Cardiovascular:     Rate and Rhythm: Normal rate and regular rhythm.     Heart sounds: No murmur heard.    No gallop.  Pulmonary:     Effort: Pulmonary effort is normal. No respiratory distress.     Breath sounds: No wheezing.  Abdominal:     General: Bowel sounds are normal. There is no distension.     Tenderness: There is no abdominal tenderness.  Musculoskeletal:        General: No swelling or tenderness. Normal range of motion.     Cervical back: Normal range of motion.  Skin:    General: Skin is warm and dry.  Neurological:     Mental Status: She is alert.     Comments: Pt is alert and oriented to person, place, month, year. Follows basic commands. Demonstrates reasonable insight and awareness. Language fairly fluid but sometimes with word finding deficits. Mild right central ViI. Speech fairly clear. MMT: RUE 3 to 3+/5. RLE 3+ to 4/5 prox to distal. LUE and LLE 4+ to 5/5. Sensory 1/2 RUE and 1+ prox RLE and tr/2 foot. DTR's 1+. No abnl resting tone.   Psychiatric:        Mood and Affect: Mood normal.        Behavior: Behavior normal.       Lab Results Last 48 Hours        Results for orders placed or performed during the hospital encounter of 02/20/24 (from the past 48 hours)  Glucose, capillary     Status: Abnormal    Collection Time: 02/23/24 11:50 AM  Result Value Ref Range    Glucose-Capillary 121 (H) 70 - 99 mg/dL      Comment: Glucose reference range applies only to samples taken after fasting for at least 8 hours.  Glucose, capillary     Status: Abnormal    Collection Time: 02/23/24  4:40 PM  Result Value Ref Range    Glucose-Capillary 101 (H) 70 - 99 mg/dL      Comment: Glucose reference range applies only to samples taken after fasting for at least 8 hours.  Glucose, capillary     Status: Abnormal    Collection Time: 02/23/24  9:14 PM  Result Value Ref Range     Glucose-Capillary 195 (H) 70 - 99 mg/dL      Comment: Glucose reference range applies only to samples taken after fasting for at least 8 hours.  Heparin  level (unfractionated)     Status: None    Collection Time: 02/24/24  1:47 AM  Result Value Ref Range    Heparin  Unfractionated 0.46 0.30 - 0.70 IU/mL      Comment: (NOTE) The clinical reportable range upper limit is being lowered to >1.10 to align with the FDA  approved guidance for the current laboratory assay.   If heparin  results are below expected values, and patient dosage has  been confirmed, suggest follow up testing of antithrombin III  levels. Performed at [facility], [address], [place], [place] [address]    Glucose, capillary     Status: Abnormal    Collection Time: 02/24/24  6:25 AM  Result Value Ref Range    Glucose-Capillary 137 (H) 70 - 99 mg/dL      Comment: Glucose reference range applies only to samples taken after fasting for at least 8 hours.  Glucose, capillary     Status: Abnormal    Collection Time: 02/24/24 11:21 AM  Result Value Ref Range    Glucose-Capillary 111 (H) 70 - 99 mg/dL      Comment: Glucose reference range applies only to samples taken after fasting for at least 8 hours.  Glucose, capillary     Status: Abnormal    Collection Time: 02/24/24  4:56 PM  Result Value Ref Range    Glucose-Capillary 117 (H) 70 - 99 mg/dL      Comment: Glucose reference range applies only to samples taken after fasting for at least 8 hours.  Glucose, capillary     Status: Abnormal    Collection Time: 02/24/24  9:46 PM  Result Value Ref Range    Glucose-Capillary 126 (H) 70 - 99 mg/dL      Comment: Glucose reference range applies only to samples taken after fasting for at least 8 hours.  CBC     Status: None    Collection Time: 02/25/24  1:58 AM  Result Value Ref Range    WBC 9.0 4.0 - 10.5 K/uL    RBC 4.67 3.87 - 5.11 MIL/uL    Hemoglobin 13.1 12.0 - 15.0 g/dL    HCT 61.1 63.9 - 53.9 %    MCV 83.1  80.0 - 100.0 fL    MCH 28.1 26.0 - 34.0 pg    MCHC 33.8 30.0 - 36.0 g/dL    RDW 85.5 88.4 - 84.4 %    Platelets 297 150 - 400 K/uL    nRBC 0.0 0.0 - 0.2 %      Comment: Performed at [facility], [address] [place], [place] [address]  Glucose, capillary     Status: Abnormal    Collection Time: 02/25/24  6:03 AM  Result Value Ref Range    Glucose-Capillary 103 (H) 70 - 99 mg/dL      Comment: Glucose reference range applies only to samples taken after fasting for at least 8 hours.      Imaging Results (Last 48 hours)  CT HEAD WO CONTRAST ( ) Result Date: 02/23/2024 EXAM: CT HEAD WITHOUT CONTRAST 02/23/2024 06:11:00 PM TECHNIQUE: CT of the head was performed without the administration of intravenous contrast. Automated exposure control, iterative reconstruction, and/or weight based adjustment of the mA/kV was utilized to reduce the radiation dose to as low as reasonably achievable. COMPARISON: Head CT 02/22/2024. Head MRI and MRV 02/20/2024. CLINICAL HISTORY: Stroke, follow up; c/o worse headache FINDINGS: BRAIN AND VENTRICLES: Parenchymal hemorrhage and moderate surrounding edema in the left parietal lobe as well as a small amount of adjacent subarachnoid hemorrhage are unchanged from yesterday's CT. No new infarct, new hemorrhage, midline shift, hydrocephalus, or extra-axial fluid collection is identified. There is a persistent hyperdense appearance of the superior sagittal sinus diffusely and of some regional cortical veins consistent with previously shown venous sinus thrombosis. ORBITS: No  acute abnormality. SINUSES: No acute abnormality. SOFT TISSUES AND SKULL: No acute soft tissue abnormality. No skull fracture. IMPRESSION: 1. 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 2. No new intracranial abnormality. Electronically signed by: Dasie Hamburg MD 02/23/2024 06:46 PM EST RP Workstation: [id number]            Blood pressure  118/64, pulse 67, temperature 98.5 F (36.9 C), temperature source Oral, resp. rate 19, height 5' 1 (1.549 m), weight 99.9 kg, SpO2 99%.   Medical Problem List and Plan: 1. Functional deficits secondary to left parietal ICH             -patient may  shower             -ELOS/Goals: 10-12 days, supervision goals with  PT, OT, SLP   2.  Antithrombotics: -DVT/anticoagulation:  Mechanical:  Antiembolism stockings, knee (TED hose) Bilateral lower extremities Pharmaceutical: Eliquis              -antiplatelet therapy: n/a 3. Pain Management/headaches:   -pt has remote hx of migraines. Says these headaches are similar in quality and intensity  -begin trial of topamax  -continue Fioricet  but limit d/t potential rebound h/a - Tylenol  as needed. 4. Mood/Behavior/Sleep: LCSW to follow for evaluation and support when available.              -antipsychotic agents: N/A 5. Neuropsych/cognition: This patient is capable of making decisions on her own behalf. 6. Skin/Wound Care: Routine pressure relief measures.  7. Fluids/Electrolytes/Nutrition: Monitor I&O and weight. Follow up labs CBC/CMP  8. 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  9.  Seizure: LTM EEG no seizure, continue Keppra  1000 mg twice daily. 10.  Hypertension: No meds PTA.  Monitor BP per protocol goal < 160.  11. HLD: No meds PTA. LDL 140 goal <70. Continue Lipitor 40 mg daily.  12. DM2: HgbA1c 6.2, goal < 7.0 Monitor glucose ac/hs with SSI Novolog .  13. Obesity:  BMI 41.61 kg/m educate on diet and weight loss to promote overall health and mobility.         Hailey LOISE Satterfield, NP 02/25/2024  I have personally performed a face to face diagnostic evaluation of this patient and formulated the key components of the plan.  Additionally, I have personally reviewed laboratory data, imaging studies, as well  as relevant notes and concur with the physician assistant's documentation above.  The patient's status has not changed from the original H&P.  Any changes in documentation from the acute care chart have been noted above.  Hailey IVAR Gunther, MD, LEELLEN

## 2024-02-25 NOTE — H&P (Signed)
 "   Physical Medicine and Rehabilitation Admission H&P    Cc: Functional deficits due to debility intracranial hemorrhage   HPI: Hailey [name] is a 59 year old female with PMHx of HTN, HLD, DM2, TIA, myocardial bridge and migraines, presented via EMS to [facility] on 02/20/24 with complaints of right lower extremity hemiparesis and disorientation.  Per chart review patient was at work when symptoms started, LKW around 1330.  Code stroke was activated by EDP and CTH revealed a left parietal lobe intracranial hemorrhage measuring 4.7 ml with adjacent SAH and sulcal effacement with no midline shift.  Patient was noted to have right leg rhythmic twitching concerning for focal seizure.  She was given IV Keppra  load, Ativan , and Vimpat  with resolution.  EEG revealed continuous slowing in the left hemisphere and generalized intermittent slowing suggestive of cortical dysfunction and no seizures or elliptic form discharges.  Keppra  continued twice daily. 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  Serial CT head scans showed stable hematoma.  Hypercoagulable workup pending, ANA and cardiolipin antibodies were negative.   For blood pressure control she received Cleviprex .  2D echo EF 55 to 60%.  Bilateral lower extremity ultrasound revealed no DVT.  VTE prophylaxis heparin  with transition to Eliquis  at discharge.  She was placed on statin therapy Lipitor 40 mg daily.  Prior to arrival the patient was independent without use of DME and working at [employer].  She lives with her spouse in a 1 level home, there are 3 steps to enter.  She currently requires mod assist +2 with mobility and basic ADLs, transfers with rolling walker. Therapy evaluations completed due to patient decreased functional mobility was admitted for a comprehensive rehab program.    Review of Systems  Constitutional: Negative.   Eyes: Negative.   Respiratory:  Negative.    Cardiovascular: Negative.   Gastrointestinal:  Negative for abdominal pain and constipation.  Genitourinary: Negative.   Neurological:  Positive for weakness and headaches. Negative for sensory change.        Past Medical History:  Diagnosis Date   Angina at rest    Anxiety    Chest pain    Diabetes mellitus without complication (HCC)    High cholesterol    High cholesterol    History of angina    History of TIA (transient ischemic attack) 10/11/2020   Hypercholesterolemia 10/11/2020   Hyperlipidemia    Migraine    Personal history of COVID-19 10/11/2020   Radiculopathy of cervical region    TIA (transient ischemic attack)    Transient cerebral ischemic attack    Type II diabetes mellitus (HCC) 10/11/2020   Past Surgical History:  Procedure Laterality Date   BUNIONECTOMY     CHOLECYSTECTOMY     Family History  Problem Relation Age of Onset   Breast cancer Maternal Aunt    Breast cancer Paternal Aunt    Social History:  reports that she has never smoked. She has never used smokeless tobacco. She reports that she does not drink alcohol and does not use drugs. Allergies: Allergies[1] Medications Prior to Admission  Medication Sig Dispense Refill   EPINEPHrine 0.3 MG/0.3ML SOSY Inject 0.3 mg as directed as needed (allergic reaction).     ondansetron  (ZOFRAN ) 4 MG tablet 1 tablet Orally Once a day as needed for nausea (Patient not taking: Reported on 02/21/2024)        Home: Home Living Family/patient expects to be discharged to::  Private residence Living Arrangements: Spouse/significant other, Other (Comment) (dog (71 lb lab Herlene)) Available Help at Discharge: Family, Available 24 hours/day Type of Home: House Home Access: Stairs to enter [employer] of Steps: 2 Entrance Stairs-Rails: Right Home Layout: One level Bathroom Shower/Tub: Health Visitor: Standard (vanity beside) Home Equipment: Hand held shower  head Additional Comments: has a Programme Researcher, Broadcasting/film/video,  Lives With: Spouse   Functional History: Prior Function Prior Level of Function : Independent/Modified Independent, Working/employed, Driving ([employer]) Mobility Comments: works at [employer] Status:  Mobility: Bed Mobility Overal bed mobility: Needs Assistance Bed Mobility: Supine to Sit Supine to sit: Contact guard, HOB elevated Sit to supine: Contact guard assist General bed mobility comments: In recliner Transfers Overall transfer level: Needs assistance Equipment used: 1 person hand held assist Transfers: Sit to/from Stand Sit to Stand: Min assist General transfer comment: Light Min A to power up. Ambulation/Gait Ambulation/Gait assistance: Min assist, Mod assist Gait Distance (Feet): 150 Feet Assistive device: 1 person hand held assist Gait Pattern/deviations: Step-through pattern, Decreased step length - right, Narrow base of support, Scissoring, Drifts right/left General Gait Details: Started off with steady gait however once fatigued became more unsteady with occasional scissoring gait and drifting. Able to complete DGI. Gait velocity: decreased Gait velocity interpretation: <1.8 ft/sec, indicate of risk for recurrent falls Stairs: Yes Stairs assistance: Contact guard assist, Min assist Stair Management: One rail Left, Alternating pattern, Step to pattern, Forwards Number of Stairs: 2 General stair comments: CGA going up and Min A coming down. Cued for step to pattern.    ADL: ADL Overall ADL's : Needs assistance/impaired Eating/Feeding: Minimal assistance Grooming: Minimal assistance Upper Body Bathing: Moderate assistance Lower Body Bathing: Maximal assistance Upper Body Dressing : Moderate assistance Lower Body Dressing: Maximal assistance Toilet Transfer: Contact guard assist, Ambulation, Rolling walker (2 wheels) General ADL Comments: Patient seen in order provide education and exercises with regard  to RUE. Patient reports decrease in tingling, but reports sensation is still off. Patient with decreased strength and decreased coordination in [facility] and [facility] movements. Green sponge provided for grip and completing activity with medicine cups for [facility]. OT educated on other activities to work on [facility], with patient in agreement. OT recommendation remains approrpiate, will continue to follow acutely.  Cognition: Cognition Overall Cognitive Status: Impaired/Different from baseline Arousal/Alertness: Awake/alert Orientation Level: Oriented X4 Attention: Sustained Sustained Attention: Appears intact Memory: Impaired Memory Impairment: Retrieval deficit Awareness: Impaired Awareness Impairment: Emergent impairment Problem Solving: Impaired Problem Solving Impairment: Verbal basic Executive Function: Reasoning Reasoning: Appears intact Cognition Arousal: Alert Behavior During Therapy: WFL for tasks assessed/performed Overall Cognitive Status: Impaired/Different from baseline  Physical Exam: Blood pressure 118/64, pulse 67, temperature 98.5 F (36.9 C), temperature source Oral, resp. rate 19, height 5' 1 (1.549 m), weight 99.9 kg, SpO2 99%. Physical Exam Constitutional:      Appearance: She is obese.     Comments: Appear sl uncomfortable d/t headache  HENT:     Head: Normocephalic and atraumatic.     Right Ear: External ear normal.     Left Ear: External ear normal.     Nose: Nose normal.     Mouth/Throat:     Mouth: Mucous membranes are moist.     Pharynx: Oropharynx is clear.  Eyes:     Extraocular Movements: Extraocular movements intact.     Pupils: Pupils are equal, round, and reactive to light.  Cardiovascular:     Rate and Rhythm: Normal rate and regular rhythm.  Heart sounds: No murmur heard.    No gallop.  Pulmonary:     Effort: Pulmonary effort is normal. No respiratory distress.     Breath sounds: No wheezing.  Abdominal:     General: Bowel sounds are normal. There is  no distension.     Tenderness: There is no abdominal tenderness.  Musculoskeletal:        General: No swelling or tenderness. Normal range of motion.     Cervical back: Normal range of motion.  Skin:    General: Skin is warm and dry.  Neurological:     Mental Status: She is alert.     Comments: Pt is alert and oriented to person, place, month, year. Follows basic commands. Demonstrates reasonable insight and awareness. Language fairly fluid but sometimes with word finding deficits. Mild right central ViI. Speech fairly clear. MMT: RUE 3 to 3+/5. RLE 3+ to 4/5 prox to distal. LUE and LLE 4+ to 5/5. Sensory 1/2 RUE and 1+ prox RLE and tr/2 foot. DTR's 1+. No abnl resting tone.   Psychiatric:        Mood and Affect: Mood normal.        Behavior: Behavior normal.     Results for orders placed or performed during the hospital encounter of 02/20/24 (from the past 48 hours)  Glucose, capillary     Status: Abnormal   Collection Time: 02/23/24 11:50 AM  Result Value Ref Range   Glucose-Capillary 121 (H) 70 - 99 mg/dL    Comment: Glucose reference range applies only to samples taken after fasting for at least 8 hours.  Glucose, capillary     Status: Abnormal   Collection Time: 02/23/24  4:40 PM  Result Value Ref Range   Glucose-Capillary 101 (H) 70 - 99 mg/dL    Comment: Glucose reference range applies only to samples taken after fasting for at least 8 hours.  Glucose, capillary     Status: Abnormal   Collection Time: 02/23/24  9:14 PM  Result Value Ref Range   Glucose-Capillary 195 (H) 70 - 99 mg/dL    Comment: Glucose reference range applies only to samples taken after fasting for at least 8 hours.  Heparin  level (unfractionated)     Status: None   Collection Time: 02/24/24  1:47 AM  Result Value Ref Range   Heparin  Unfractionated 0.46 0.30 - 0.70 IU/mL    Comment: (NOTE) The clinical reportable range upper limit is being lowered to >1.10 to align with the FDA approved guidance for the  current laboratory assay.  If heparin  results are below expected values, and patient dosage has  been confirmed, suggest follow up testing of antithrombin III  levels. Performed at [facility], [address] [place], [place] [address]   Glucose, capillary     Status: Abnormal   Collection Time: 02/24/24  6:25 AM  Result Value Ref Range   Glucose-Capillary 137 (H) 70 - 99 mg/dL    Comment: Glucose reference range applies only to samples taken after fasting for at least 8 hours.  Glucose, capillary     Status: Abnormal   Collection Time: 02/24/24 11:21 AM  Result Value Ref Range   Glucose-Capillary 111 (H) 70 - 99 mg/dL    Comment: Glucose reference range applies only to samples taken after fasting for at least 8 hours.  Glucose, capillary     Status: Abnormal   Collection Time: 02/24/24  4:56 PM  Result Value Ref Range   Glucose-Capillary 117 (H)  70 - 99 mg/dL    Comment: Glucose reference range applies only to samples taken after fasting for at least 8 hours.  Glucose, capillary     Status: Abnormal   Collection Time: 02/24/24  9:46 PM  Result Value Ref Range   Glucose-Capillary 126 (H) 70 - 99 mg/dL    Comment: Glucose reference range applies only to samples taken after fasting for at least 8 hours.  CBC     Status: None   Collection Time: 02/25/24  1:58 AM  Result Value Ref Range   WBC 9.0 4.0 - 10.5 K/uL   RBC 4.67 3.87 - 5.11 MIL/uL   Hemoglobin 13.1 12.0 - 15.0 g/dL   HCT 61.1 63.9 - 53.9 %   MCV 83.1 80.0 - 100.0 fL   MCH 28.1 26.0 - 34.0 pg   MCHC 33.8 30.0 - 36.0 g/dL   RDW 85.5 88.4 - 84.4 %   Platelets 297 150 - 400 K/uL   nRBC 0.0 0.0 - 0.2 %    Comment: Performed at [facility], [address] [place], [place] [address]  Glucose, capillary     Status: Abnormal   Collection Time: 02/25/24  6:03 AM  Result Value Ref Range   Glucose-Capillary 103 (H) 70 - 99 mg/dL    Comment: Glucose reference range applies only to samples taken after fasting for  at least 8 hours.   CT HEAD WO CONTRAST ( ) Result Date: 02/23/2024 EXAM: CT HEAD WITHOUT CONTRAST 02/23/2024 06:11:00 PM TECHNIQUE: CT of the head was performed without the administration of intravenous contrast. Automated exposure control, iterative reconstruction, and/or weight based adjustment of the mA/kV was utilized to reduce the radiation dose to as low as reasonably achievable. COMPARISON: Head CT 02/22/2024. Head MRI and MRV 02/20/2024. CLINICAL HISTORY: Stroke, follow up; c/o worse headache FINDINGS: BRAIN AND VENTRICLES: Parenchymal hemorrhage and moderate surrounding edema in the left parietal lobe as well as a small amount of adjacent subarachnoid hemorrhage are unchanged from yesterday's CT. No new infarct, new hemorrhage, midline shift, hydrocephalus, or extra-axial fluid collection is identified. There is a persistent hyperdense appearance of the superior sagittal sinus diffusely and of some regional cortical veins consistent with previously shown venous sinus thrombosis. ORBITS: No acute abnormality. SINUSES: No acute abnormality. SOFT TISSUES AND SKULL: No acute soft tissue abnormality. No skull fracture. IMPRESSION: 1. 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 2. No new intracranial abnormality. Electronically signed by: Dasie Hamburg MD 02/23/2024 06:46 PM EST RP Workstation: [id number]      Blood pressure 118/64, pulse 67, temperature 98.5 F (36.9 C), temperature source Oral, resp. rate 19, height 5' 1 (1.549 m), weight 99.9 kg, SpO2 99%.  Medical Problem List and Plan: 1. Functional deficits secondary to left parietal ICH  -patient may  shower  -ELOS/Goals: 10-12 days, supervision goals with  PT, OT, SLP  2.  Antithrombotics: -DVT/anticoagulation:  Mechanical:  Antiembolism stockings, knee (TED hose) Bilateral lower extremities Pharmaceutical: Eliquis   -antiplatelet therapy: n/a 3. Pain Management/headaches:    -pt has remote hx of migraines. Says these headaches are similar in quality and intensity  -begin trial of topamax  -continue Fioricet  but limit d/t potential rebound h/a - Tylenol  as needed. 4. Mood/Behavior/Sleep: LCSW to follow for evaluation and support when available.   -antipsychotic agents: N/A 5. Neuropsych/cognition: This patient is capable of making decisions on her own behalf. 6. Skin/Wound Care: Routine pressure relief measures.  7. Fluids/Electrolytes/Nutrition: Monitor I&O and  weight. Follow up labs CBC/CMP  8. 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  9.  Seizure: LTM EEG no seizure, continue Keppra  1000 mg twice daily. 10.  Hypertension: No meds PTA.  Monitor BP per protocol goal < 160.  11. HLD: No meds PTA. LDL 140 goal <70. Continue Lipitor 40 mg daily.  12. DM2: HgbA1c 6.2, goal < 7.0 Monitor glucose ac/hs with SSI Novolog .  13. Obesity:  BMI 41.61 kg/m educate on diet and weight loss to promote overall health and mobility.          Daphne LOISE Satterfield, NP 02/25/2024      [1]  Allergies Allergen Reactions   Hazelnut (Filbert) Anaphylaxis   Penicillins Anaphylaxis   Coconut (Cocos Nucifera)    Cocos Nucifera    Ozempic (0.25 Or 0.5 Mg-Dose) [Semaglutide(0.25 Or 0.5mg -Dos)] Rash   Rosuvastatin  Other (See Comments)    Dizzy per pt   Tirzepatide Rash   "

## 2024-02-25 NOTE — Plan of Care (Signed)
 Frustrated as she still has headache. Doctor has dc the Fioricet  and added Topamax  for concern that Fioricet  was causing rebound headache, then was given headache cocktail and she is asleep right now.    Problem: Health Behavior/Discharge Planning: Goal: Goals will be collaboratively established with patient/family Outcome: Progressing   Problem: Health Behavior/Discharge Planning: Goal: Ability to manage health-related needs will improve Outcome: Progressing   Problem: Nutrition: Goal: Dietary intake will improve Outcome: Progressing   Problem: Skin Integrity: Goal: Risk for impaired skin integrity will decrease Outcome: Progressing   Problem: Nutritional: Goal: Maintenance of adequate nutrition will improve Outcome: Progressing   Problem: Safety: Goal: Ability to remain free from injury will improve Outcome: Progressing   Problem: Pain Managment: Goal: General experience of comfort will improve and/or be controlled Outcome: Progressing   Problem: Coping: Goal: Level of anxiety will decrease Outcome: Progressing

## 2024-02-25 NOTE — Progress Notes (Signed)
 Discharged to 70M 06 after report called to nurse Olam by [facility] nurse.

## 2024-02-25 NOTE — Discharge Summary (Addendum)
 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   atorvastatin  40 MG tablet Commonly known as: LIPITOR Take 1 tablet (40 mg total) by mouth daily. Start taking on: February 26, 2024   EPINEPHrine 0.3 MG/0.3ML Sosy Inject 0.3 mg as directed as needed (allergic reaction).   levETIRAcetam  1000 MG tablet Commonly known as: KEPPRA  Take 1 tablet (1,000 mg total) by mouth 2 (two) times daily.   ondansetron  4 MG tablet Commonly known as: ZOFRAN  1 tablet Orally Once a day as needed for nausea   topiramate  50 MG tablet Commonly known as: TOPAMAX  Take 1 tablet (50 mg total) by mouth 2 (two) times daily.       LABORATORY STUDIES CBC    Component Value Date/Time   WBC 9.0 02/25/2024 0158   RBC 4.67 02/25/2024 0158   HGB 13.1 02/25/2024 0158   HGB 13.7 01/22/2024 1626   HCT 38.8 02/25/2024 0158   HCT 41.9 01/22/2024 1626   PLT 297 02/25/2024 0158   PLT 302 01/22/2024 1626   MCV 83.1 02/25/2024 0158   MCV 87 01/22/2024 1626   MCH 28.1 02/25/2024 0158    MCHC 33.8 02/25/2024 0158   RDW 14.4 02/25/2024 0158   RDW 14.4 01/22/2024 1626   LYMPHSABS 2.6 02/20/2024 1511   MONOABS 0.5 02/20/2024 1511   EOSABS 0.2 02/20/2024 1511   BASOSABS 0.0 02/20/2024 1511   CMP    Component Value Date/Time   NA 138 02/23/2024 0441   NA 141 01/22/2024 1626   K 3.8 02/23/2024 0441   CL 101 02/23/2024 0441   CO2 27 02/23/2024 0441   GLUCOSE 127 (H) 02/23/2024 0441   BUN 12 02/23/2024 0441   BUN 14 01/22/2024 1626   CREATININE 0.72 02/23/2024 0441   CALCIUM  9.0 02/23/2024 0441   PROT 6.3 (L) 02/23/2024 0441   PROT 6.7 01/22/2024 1626   ALBUMIN 3.6 02/23/2024 0441   ALBUMIN 4.3 01/22/2024 1626   AST 13 (L) 02/23/2024 0441   ALT 20 02/23/2024 0441   ALKPHOS 64 02/23/2024 0441   BILITOT 0.4 02/23/2024 0441   BILITOT 0.3 01/22/2024 1626   GFRNONAA >60 02/23/2024 0441   COAGS Lab Results  Component Value Date   INR 0.9 02/20/2024   Lipid Panel    Component Value Date/Time   CHOL 233 (H) 02/21/2024 0528   TRIG 144 02/21/2024 0528   HDL 64 02/21/2024 0528   CHOLHDL 3.6 02/21/2024 0528   VLDL 29 02/21/2024 0528  LDLCALC 140 (H) 02/21/2024 0528   HgbA1C  Lab Results  Component Value Date   HGBA1C 6.2 (H) 02/20/2024   Drugs of Abuse     Component Value Date/Time   LABOPIA NEGATIVE 02/21/2024 1032   COCAINSCRNUR NEGATIVE 02/21/2024 1032   LABBENZ POSITIVE (A) 02/21/2024 1032   AMPHETMU NEGATIVE 02/21/2024 1032   THCU NEGATIVE 02/21/2024 1032   LABBARB NEGATIVE 02/21/2024 1032    Alcohol Level    Component Value Date/Time   ETH <15 02/20/2024 1511   SIGNIFICANT DIAGNOSTIC STUDIES CT HEAD WO CONTRAST ( ) Result Date: 02/23/2024 EXAM: CT HEAD WITHOUT CONTRAST 02/23/2024 06:11:00 PM TECHNIQUE: CT of the head was performed without the administration of intravenous contrast. Automated exposure control, iterative reconstruction, and/or weight based adjustment of the mA/kV was utilized to reduce the radiation dose to as low as reasonably  achievable. COMPARISON: Head CT 02/22/2024. Head MRI and MRV 02/20/2024. CLINICAL HISTORY: Stroke, follow up; c/o worse headache FINDINGS: BRAIN AND VENTRICLES: Parenchymal hemorrhage and moderate surrounding edema in the left parietal lobe as well as a small amount of adjacent subarachnoid hemorrhage are unchanged from yesterday's CT. No new infarct, new hemorrhage, midline shift, hydrocephalus, or extra-axial fluid collection is identified. There is a persistent hyperdense appearance of the superior sagittal sinus diffusely and of some regional cortical veins consistent with previously shown venous sinus thrombosis. ORBITS: No acute abnormality. SINUSES: No acute abnormality. SOFT TISSUES AND SKULL: No acute soft tissue abnormality. No skull fracture. IMPRESSION: 1. 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 2. No new intracranial abnormality. Electronically signed by: Dasie Hamburg MD 02/23/2024 06:46 PM EST RP Workstation: [id number]   VAS US  LOWER EXTREMITY VENOUS (DVT) Result Date: 02/22/2024  Lower Venous DVT Study Patient Name:  Hailey [name]  Date of Exam:   02/22/2024 Medical Rec #: [id number]      Accession #:    [id number] Date of Birth: [birth year]      Patient Gender: F Patient Age:   59 years Exam Location:  [facility] Procedure:      VAS US  LOWER EXTREMITY VENOUS (DVT) Referring Phys: ARY Caffie Sotto --------------------------------------------------------------------------------  Indications: Cerebral venous sinus thrombosis.  Comparison Study: No previous exams Performing Technologist: Jody Hill RVT, RDMS  Examination Guidelines: A complete evaluation includes B-mode imaging, spectral Doppler, color Doppler, and power Doppler as needed of all accessible portions of each vessel. Bilateral testing is considered an integral part of a complete examination. Limited examinations for reoccurring indications may be performed as noted. The reflux  portion of the exam is performed with the patient in reverse Trendelenburg.  +---------+---------------+---------+-----------+----------+--------------+ RIGHT    CompressibilityPhasicitySpontaneityPropertiesThrombus Aging +---------+---------------+---------+-----------+----------+--------------+ CFV      Full           Yes      Yes                                 +---------+---------------+---------+-----------+----------+--------------+ SFJ      Full                                                        +---------+---------------+---------+-----------+----------+--------------+ FV Prox  Full           Yes      Yes                                 +---------+---------------+---------+-----------+----------+--------------+  FV Mid   Full           Yes      Yes                                 +---------+---------------+---------+-----------+----------+--------------+ FV DistalFull           Yes      Yes                                 +---------+---------------+---------+-----------+----------+--------------+ PFV      Full                                                        +---------+---------------+---------+-----------+----------+--------------+ POP      Full           Yes      Yes                                 +---------+---------------+---------+-----------+----------+--------------+ PTV      Full                                                        +---------+---------------+---------+-----------+----------+--------------+   +---------+---------------+---------+-----------+----------+--------------+ LEFT     CompressibilityPhasicitySpontaneityPropertiesThrombus Aging +---------+---------------+---------+-----------+----------+--------------+ CFV      Full           Yes      Yes                                 +---------+---------------+---------+-----------+----------+--------------+ SFJ      Full                                                         +---------+---------------+---------+-----------+----------+--------------+ FV Prox  Full           Yes      Yes                                 +---------+---------------+---------+-----------+----------+--------------+ FV Mid   Full           Yes      Yes                                 +---------+---------------+---------+-----------+----------+--------------+ FV DistalFull           Yes                                          +---------+---------------+---------+-----------+----------+--------------+ PFV      Full                                                        +---------+---------------+---------+-----------+----------+--------------+  POP      Full           Yes      Yes                                 +---------+---------------+---------+-----------+----------+--------------+ PTV      Full                                                        +---------+---------------+---------+-----------+----------+--------------+ PERO     Full                                                        +---------+---------------+---------+-----------+----------+--------------+     Summary: BILATERAL: - No evidence of deep vein thrombosis seen in the lower extremities, bilaterally. -No evidence of popliteal cyst, bilaterally.   *See table(s) above for measurements and observations. Electronically signed by Penne Colorado MD on 02/22/2024 at 4:10:11 PM.    Final    ECHOCARDIOGRAM COMPLETE Result Date: 02/22/2024    ECHOCARDIOGRAM REPORT   Patient Name:   Hailey [name] Date of Exam: 02/22/2024 Medical Rec #:  [id number]     Height:       61.0 in Accession #:    [id number]    Weight:       220.2 lb Date of Birth:  04/19/1965     BSA:          1.968 m Patient Age:    58 years      BP:           115/84 mmHg Patient Gender: F             HR:           67 bpm. Exam Location:  Inpatient Procedure: 2D Echo, Cardiac Doppler, Color Doppler, Strain Analysis  and Saline            Contrast Bubble Study (Both Spectral and Color Flow Doppler were            utilized during procedure). Indications:    Stroke  History:        Patient has no prior history of Echocardiogram examinations.                 Risk Factors:Diabetes.  Sonographer:    Philomena Daring Referring Phys: ERIN C LEHNER  Sonographer Comments: Global longitudinal strain was attempted. IMPRESSIONS  1. Left ventricular ejection fraction, by estimation, is 55 to 60%. The left ventricle has normal function. The left ventricle has no regional wall motion abnormalities. Left ventricular diastolic parameters were normal. The average left ventricular global longitudinal strain is -13.4 %. The global longitudinal strain is abnormal.  2. Right ventricular systolic function is normal. The right ventricular size is normal.  3. The mitral valve is abnormal. Trivial mitral valve regurgitation. No evidence of mitral stenosis.  4. The aortic valve is tricuspid. There is mild calcification of the aortic valve. Aortic valve regurgitation is not visualized. Aortic valve sclerosis is present, with no evidence of aortic valve stenosis.  5. The inferior vena cava is normal in size  with greater than 50% respiratory variability, suggesting right atrial pressure of 3 mmHg. FINDINGS  Left Ventricle: Left ventricular ejection fraction, by estimation, is 55 to 60%. The left ventricle has normal function. The left ventricle has no regional wall motion abnormalities. The average left ventricular global longitudinal strain is -13.4 %. Strain was performed and the global longitudinal strain is abnormal. The left ventricular internal cavity size was normal in size. There is no left ventricular hypertrophy. Left ventricular diastolic parameters were normal. Right Ventricle: The right ventricular size is normal. No increase in right ventricular wall thickness. Right ventricular systolic function is normal. Left Atrium: Left atrial size was normal  in size. Right Atrium: Right atrial size was normal in size. Pericardium: There is no evidence of pericardial effusion. Mitral Valve: The mitral valve is abnormal. There is mild thickening of the mitral valve leaflet(s). Trivial mitral valve regurgitation. No evidence of mitral valve stenosis. Tricuspid Valve: The tricuspid valve is normal in structure. Tricuspid valve regurgitation is trivial. No evidence of tricuspid stenosis. Aortic Valve: The aortic valve is tricuspid. There is mild calcification of the aortic valve. Aortic valve regurgitation is not visualized. Aortic valve sclerosis is present, with no evidence of aortic valve stenosis. Pulmonic Valve: The pulmonic valve was normal in structure. Pulmonic valve regurgitation is trivial. No evidence of pulmonic stenosis. Aorta: The aortic root is normal in size and structure. Venous: The inferior vena cava is normal in size with greater than 50% respiratory variability, suggesting right atrial pressure of 3 mmHg. IAS/Shunts: No atrial level shunt detected by color flow Doppler. Agitated saline contrast was given intravenously to evaluate for intracardiac shunting. Additional Comments: 3D was performed not requiring image post processing on an independent workstation and was indeterminate.  LEFT VENTRICLE PLAX 2D LVIDd:         5.00 cm     Diastology LVIDs:         3.20 cm     LV e' medial:    8.16 cm/s LV PW:         0.80 cm     LV E/e' medial:  10.9 LV IVS:        0.90 cm     LV e' lateral:   11.40 cm/s LVOT diam:     2.00 cm     LV E/e' lateral: 7.8 LV SV:         60 LV SV Index:   31          2D Longitudinal Strain LVOT Area:     3.14 cm    2D Strain GLS Avg:     -13.4 % LV IVRT:       56 msec  LV Volumes (MOD) LV vol d, MOD A2C: 69.2 ml LV vol d, MOD A4C: 74.7 ml LV vol s, MOD A2C: 22.2 ml LV vol s, MOD A4C: 30.1 ml LV SV MOD A2C:     47.0 ml LV SV MOD A4C:     74.7 ml LV SV MOD BP:      45.2 ml RIGHT VENTRICLE             IVC RV Basal diam:  3.30 cm      IVC diam: 1.90 cm RV Mid diam:    2.40 cm RV S prime:     10.90 cm/s TAPSE (M-mode): 2.0 cm LEFT ATRIUM             Index        RIGHT ATRIUM  Index LA diam:        3.40 cm 1.73 cm/m   RA Area:     14.60 cm LA Vol (A2C):   38.3 ml 19.46 ml/m  RA Volume:   36.40 ml  18.50 ml/m LA Vol (A4C):   28.0 ml 14.23 ml/m LA Biplane Vol: 33.0 ml 16.77 ml/m  AORTIC VALVE LVOT Vmax:   93.30 cm/s LVOT Vmean:  62.400 cm/s LVOT VTI:    0.192 m  AORTA Ao Root diam: 2.50 cm MITRAL VALVE MV Area (PHT): 4.71 cm    SHUNTS MV Decel Time: 161 msec    Systemic VTI:  0.19 m MV E velocity: 89.10 cm/s  Systemic Diam: 2.00 cm MV A velocity: 75.50 cm/s MV E/A ratio:  1.18 Maude Emmer MD Electronically signed by Maude Emmer MD Signature Date/Time: 02/22/2024/2:22:56 PM    Final    CT HEAD WO CONTRAST ( ) Result Date: 02/22/2024 EXAM: CT HEAD WITHOUT CONTRAST 02/22/2024 09:25:28 AM TECHNIQUE: CT of the head was performed without the administration of intravenous contrast. Automated exposure control, iterative reconstruction, and/or weight based adjustment of the mA/kV was utilized to reduce the radiation dose to as low as reasonably achievable. COMPARISON: CT of the head dated 02/20/2024 and 02/21/2024. CLINICAL HISTORY: Stroke, hemorrhagic. FINDINGS: BRAIN AND VENTRICLES: There is no significant interval change in appearance of intraparenchymal hemorrhage within the left parietal lobe and adjacent subarachnoid hemorrhage within the parietal sulci. There is a moderate amount of surrounding vasogenic edema. There is no mass effect or midline shift. There continues to be increased density within the superior sagittal sinus posteriorly and superiorly, compatible with thrombosis. No evidence of acute infarct. No hydrocephalus. No extra-axial collection. ORBITS: No acute abnormality. SINUSES: No acute abnormality. SOFT TISSUES AND SKULL: No acute soft tissue abnormality. No skull fracture. IMPRESSION: 1. No significant interval  change in appearance of intraparenchymal hemorrhage within the left parietal lobe and adjacent subarachnoid hemorrhage within the parietal sulci, with moderate surrounding vasogenic edema. No midline shift. 2. Increased density within the superior sagittal sinus posteriorly and superiorly, compatible with thrombosis. Electronically signed by: Evalene Coho MD 02/22/2024 09:34 AM EST RP Workstation: [id number]   Overnight EEG with video Result Date: 02/21/2024 Shelton Arlin KIDD, MD     02/22/2024  8:56 AM Patient Name: Hailey [name] MRN: [id number] Epilepsy Attending: Arlin KIDD Shelton Referring Physician/Provider: Judithe Rocky BROCKS, NP Duration: 02/20/2024 1758 to 02/21/2024 1758 Patient history: 59 y.o. female p/w headache x 2-3 days, who had R leg tingling/numbness and twitching at work. EEG to evaluate for seizure Level of alertness: Awake, asleep AEDs during EEG study: LEV Technical aspects: This EEG study was done with scalp electrodes positioned according to the 10-20 International system of electrode placement. Electrical activity was reviewed with band pass filter of 1-70Hz , sensitivity of 7 uV/mm, display speed of 49mm/sec with a 60Hz  notched filter applied as appropriate. EEG data were recorded continuously and digitally stored.  Video monitoring was available and reviewed as appropriate. Description: The posterior dominant rhythm consists of 9 Hz activity of moderate voltage (25-35 uV) seen predominantly in posterior head regions, symmetric and reactive to eye opening and eye closing. Sleep was characterized by vertex waves, sleep spindles (12 to 14 Hz), maximal frontocentral region. There is continuous 3 to 6 Hz theta-delta slowing in left hemisphere. Intermittent generalized 3-6h thetas-delta slowing was also noted,  admixed with 13-15hz  beta activity. Hyperventilation and photic stimulation were not performed.   ABNORMALITY - Continuous slow, left hemisphere - Intermittent slow,  generalized IMPRESSION:  This study is suggestive of cortical dysfunction arising from left hemisphere likely secondary to underlying structural abnormality. Additionally there is mild diffuse encephalopathy. No seizures or epileptiform discharges were seen throughout the recording. Hailey [name]   CT HEAD POST STROKE FOLLOWUP/TIMED/STAT READ Result Date: 02/21/2024 EXAM: CT HEAD WITHOUT CONTRAST 02/21/2024 05:07:48 AM TECHNIQUE: CT of the head was performed without the administration of intravenous contrast. Automated exposure control, iterative reconstruction, and/or weight based adjustment of the mA/kV was utilized to reduce the radiation dose to as low as reasonably achievable. COMPARISON: 02/20/2024 CLINICAL HISTORY: Neuro deficit, acute, stroke suspected. FINDINGS: BRAIN AND VENTRICLES: Redemonstrated left parietal hemorrhage with surrounding cytotoxic edema. Unchanged adjacent subarachnoid hemorrhage. Regional mass effect. The superior sagittal sinus remains unusually dense posteriorly consistent with thrombosis. No evidence of acute infarct. No hydrocephalus. No midline shift. ORBITS: No acute abnormality. SINUSES: No acute abnormality. SOFT TISSUES AND SKULL: No acute soft tissue abnormality. No skull fracture. IMPRESSION: 1. Redemonstrated left parietal hemorrhage with surrounding cytotoxic edema, unchanged adjacent subarachnoid hemorrhage, and regional mass effect. 2. Superior sagittal sinus remains unusually dense posteriorly, consistent with thrombosis. Electronically signed by: Evalene Coho MD 02/21/2024 05:30 AM EST RP Workstation: [id number]   CT HEAD POST STROKE FOLLOWUP/TIMED/STAT READ Result Date: 02/20/2024 EXAM: CT HEAD WITHOUT CONTRAST 02/20/2024 08:34:30 PM TECHNIQUE: CT of the head was performed without the administration of intravenous contrast. Automated exposure control, iterative reconstruction, and/or weight based adjustment of the mA/kV was utilized to reduce the radiation dose to as low as  reasonably achievable. COMPARISON: 02/20/2024 CLINICAL HISTORY: Neuro deficit, acute, stroke suspected. FINDINGS: BRAIN AND VENTRICLES: Stable intraparenchymal hemorrhage in left parietal lobe. Stable adjacent subarachnoid hemorrhage. Stable edema. Local mass effect without midline shift. No evidence of acute infarct. No hydrocephalus. No extra-axial collection. ORBITS: No acute abnormality. SINUSES: No acute abnormality. SOFT TISSUES AND SKULL: No acute soft tissue abnormality. No skull fracture. IMPRESSION: 1. Stable intraparenchymal hemorrhage in the left parietal lobe with adjacent subarachnoid hemorrhage, edema, and local mass effect without midline shift. Electronically signed by: Franky Stanford MD 02/20/2024 08:44 PM EST RP Workstation: [id number]   MR MRV HEAD W WO CONTRAST Result Date: 02/20/2024 EXAM: MRV BRAIN 02/20/2024 06:03:33 PM TECHNIQUE: Multiplanar multisequence MRV of the head was performed with and without the administration of intravenous contrast. 7 mL of gadobutrol  (GADAVIST ) 1 MMOL/ML injection was administered. Multiplanar 2D and 3D reformatted images are provided for review. COMPARISON: None available. 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 Hemorrhagic infarct of the high left parietal lobe. FINDINGS: Diffuse thrombus is demonstrated within the superior sagittal sinus. Flow is present below the torcular and in cortical veins over the convexity that drain inferiorly. IMPRESSION: 1. Diffuse thrombus confirmed within the superior sagittal sinus. Electronically signed by: Lonni Necessary MD 02/20/2024 06:30 PM EST RP Workstation: [id number]   MR BRAIN W WO CONTRAST Result Date: 02/20/2024 EXAM: MRI BRAIN WITH AND WITHOUT CONTRAST 02/20/2024 06:01:51 PM TECHNIQUE: Multiplanar multisequence MRI of the head/brain was performed with and without the administration of 7 mL gadobutrol  (GADAVIST ) 1 MMOL/ML intravenous contrast. COMPARISON: CT head without contrast, CT angio  head and neck and CT venogram 17:26. CLINICAL HISTORY: Neuro deficit, acute, stroke suspected; Stroke, follow up; Transient ischemic attack (TIA). Acute onset of right lower extremity weakness. FINDINGS: BRAIN AND VENTRICLES: Hemorrhagic infarct of the high posterior left parietal lobe, stable compared to the prior CT. Surrounding vasogenic edema is present, effacing the local sulci without midline shift. Superior sagittal sinus thrombosis is noted. Susceptibility  is noted about the thrombus. T1 shortening is present within the superior sagittal sinus thrombus on the noncontrast T1 axial images. Postcontrast images demonstrate luxury perfusion to the infarct area. Enhancement is noted along the margin of the superior sagittal sinus. No acute intracranial hemorrhage. No mass effect or midline shift. No hydrocephalus. The sella is unremarkable. Normal flow voids are present, except for the superior sagittal sinus. ORBITS: No significant abnormality. SINUSES: No significant abnormality. BONES AND SOFT TISSUES: Normal bone marrow signal. No soft tissue abnormality. IMPRESSION: 1. Stable hemorrhagic infarct in the high posterior left parietal lobe with surrounding vasogenic edema and local sulcal effacement, without midline shift. 2. Superior sagittal sinus thrombosis. Vanessa findings were discussed with Dr. Vanessa at 5:19 pm Electronically signed by: Lonni Necessary MD 02/20/2024 06:27 PM EST RP Workstation: [id number]   CT HEAD CODE STROKE WO CONTRAST Addendum Date: 02/20/2024 ADDENDUM #1 ADDENDUM: The hemorrhagic infarct is in the left parietal lobe, not the right. ---------------------------------------------------- Electronically signed by: Lonni Necessary MD 02/20/2024 05:31 PM EST RP Workstation: [id number]   Result Date: 02/20/2024  ORIGINAL REPORT EXAM: CT HEAD WITHOUT CONTRAST 02/20/2024 03:01:04 PM TECHNIQUE: CT of the head was performed without the administration of intravenous contrast.  Automated exposure control, iterative reconstruction, and/or weight based adjustment of the mA/kV was utilized to reduce the radiation dose to as low as reasonably achievable. COMPARISON: CT head without contrast 07/26/20. CLINICAL HISTORY: Acute onset of right leg weakness. Neuro deficit, acute, stroke suspected. FINDINGS: BRAIN AND VENTRICLES: A hemorrhagic infarct in the right parietal lobe measures 2.1 x 1.6 x 2.8 cm (volume approximately 9.4 ml). Adjacent subarachnoid hemorrhage is present. Sulcal effacement is present without midline shift. [place] stroke program early CT score (ASPECTS) Ganglionic (caudate, IC, lentiform nucleus, insula, M1-M3): 7 Supraganglionic (M4-M6): 2 Total: 9 ORBITS: No acute abnormality. SINUSES: No acute abnormality. SOFT TISSUES AND SKULL: No acute soft tissue abnormality. No skull fracture. IMPRESSION: 1. Hemorrhagic infarct in the right parietal lobe measuring 2.1 x 1.6 x 2.8 cm (volume 4.7 mL), with adjacent subarachnoid hemorrhage and sulcal effacement without midline shift. 2. ASPECTS 9. 3. these results were communicated to Dr. Vanessa at 3:07 pm on 02/20/2024 by secure text page via the [facility] messaging system. Electronically signed by: Lonni Necessary MD 02/20/2024 03:08 PM EST RP Workstation: [id number]   CT VENOGRAM HEAD Result Date: 02/20/2024 EXAM: CT VENOGRAM WITH CONTRAST 02/20/2024 03:23:11 PM TECHNIQUE: CT venogram of the head/brain was performed with the administration of intravenous contrast. 75 mL of iohexol  (OMNIPAQUE ) 350 MG/ML injection was administered. Multiplanar reformatted images are provided for review. MIP images are provided for review. Automated exposure control, iterative reconstruction, and/or weight based adjustment of the mA/kV was utilized to reduce the radiation dose to as low as reasonably achievable. COMPARISON: None available. CLINICAL HISTORY: Stroke, hemorrhagic. FINDINGS: CT VENOGRAM: Enhancement is present at the periphery of the  superior sagittal sinus. A filling defect is present in the superior sagittal sinus to the level of the trochlear. The right transverse sinus is dominant. IMPRESSION: 1. 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 Electronically signed by: Lonni Necessary MD 02/20/2024 03:41 PM EST RP Workstation: [id number]   CT ANGIO HEAD NECK W WO CM (CODE STROKE) Result Date: 02/20/2024 EXAM: CTA HEAD AND NECK WITH AND WITHOUT 02/20/2024 03:23:11 PM TECHNIQUE: CTA of the head and neck was performed with and without the administration of 75 mL of iohexol  (OMNIPAQUE ) 350 MG/ML injection. Multiplanar  2D and/or 3D reformatted images are provided for review. Automated exposure control, iterative reconstruction, and/or weight based adjustment of the mA/kV was utilized to reduce the radiation dose to as low as reasonably achievable. Stenosis of the internal carotid arteries measured using NASCET criteria. COMPARISON: CT head without contrast 12/21/2023. CLINICAL HISTORY: Neuro deficit, acute, stroke suspected. 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 FINDINGS: CTA NECK: AORTIC ARCH AND ARCH VESSELS: Common origin of the left common carotid artery and the innominate artery is present. No dissection or arterial injury. No significant stenosis of the brachiocephalic or subclavian arteries. CERVICAL CAROTID ARTERIES: No dissection, arterial injury, or hemodynamically significant stenosis by NASCET criteria. CERVICAL VERTEBRAL ARTERIES: Mild narrowing is present at the origin of the right vertebral artery. No dissection or arterial injury. LUNGS AND MEDIASTINUM: Unremarkable. SOFT TISSUES: No acute abnormality. BONES: No acute abnormality. CTA HEAD: ANTERIOR CIRCULATION: No significant stenosis of the internal carotid arteries. 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 Both anterior cerebral arteries filled from the left. No significant stenosis of the  middle cerebral arteries. No aneurysm. POSTERIOR CIRCULATION: No significant stenosis of the posterior cerebral arteries. No significant stenosis of the basilar artery. No significant stenosis of the vertebral arteries. No aneurysm. OTHER: The superior sagittal sinus is not opacified above the torcular. Prominent cortical veins are present bilaterally. No dural venous sinus thrombosis on this non-dedicated study. IMPRESSION: 1. 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 2. Nonopacification of the superior sagittal sinus above the torcular with prominent cortical veins, consistent with for superior sagittal sinus thrombosis. 3. Mild narrowing at the origin of the right vertebral artery. Critical findings were discussed with Dr. Vanessa at 3:22 pm. Electronically signed by: Lonni Necessary MD 02/20/2024 03:39 PM EST RP Workstation: [id number]    HISTORY OF PRESENT ILLNESS 59 y.o. patient with history of hypertension, hyperlipidemia, type 2 diabetes mellitus, TIA, and migraines was admitted with disorientation and right leg weakness and numbness with right leg twitching at onset of symptoms.  Right leg twitching resolved after Keppra , Ativan , and Vimpat  consistent with seizure activity.  CT head revealed right parietal lobe ICH with subarachnoid hemorrhage and CTV revealed cerebral venous sinus thrombosis for which she was placed on heparin .  NIH on admission is 1.  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 Code Stroke CT head right parietal lobe ICH with volume of 4.7 mL, aspects 9 CTA head & neck nonopacification of superior sagittal sinus consistent with superior sagittal sinus thrombosis, mild narrowing at the origin of the right vertebral artery CT venogram filling defect involving the superior sagittal sinus to the level of the torcular concerning for dural venous sinus thrombosis MRI stable hemorrhagic infarct in high left posterior parietal lobe  with surrounding vasogenic edema and local sulcal effacement, superior sagittal sinus thrombosis MRV Diffuse thrombus confirmed within the superior sagittal sinus.  CT repeat x 4 stable hematoma 2D Echo EF 55 to 60% Bilateral lower extremity ultrasound no DVT LDL 140 HgbA1c 6.2 UDS benzo Hypercoagulable work up negative VTE prophylaxis -fully anticoagulated with Eliquis  No antithrombotic prior to admission heparin  IV transitioned to Eliquis  on 02/24/2024 Therapy recommendations:  CIR Disposition: Discharge to CIR   Seizure Patient noted to have right leg twitching with onset of symptoms at work, which repeated when she was in the emergency department Symptoms resolved after administration of lorazepam , Keppra  and Vimpat  LTM EEG 1/17 to 1/18 continuous slowing in the left hemisphere and generalized intermittent slowing, suggestive of cortical dysfunction  arising from left hemisphere and no seizures or epileptiform discharges Continue Keppra  1000 mg twice daily LTM EEG no seizure, d/c'ed   Headaches, intractable Complains of right frontal headache on exam this morning Fioricet  and tylenol  PRN alternating Headache cocktail x 1 on 02/23/24 with improvement, repeated today Concern for Fioricet  rebound headache, will limit further use of Fioricet  and start Topamax  Expecting improving over time Serial CT repeat stable   Hypertension Home meds: None Stable Off cleviprex  BP goal < 160 Long term BP goal normotensive   Hyperlipidemia Home meds: None LDL 140, goal < 70 on lipitor 40 Continue statin at discharge   Diabetes type II Controlled Home meds: None HgbA1c 6.2, goal < 7.0 CBGs SSI Recommend close follow-up with PCP for better DM control   Other Stroke Risk Factors Obesity, Body mass index is 41.61 kg/m., BMI >/= 30 associated with increased stroke risk, recommend weight loss, diet and exercise as appropriate  Migraines  DISCHARGE EXAM  PHYSICAL EXAM General:  Alert,  well-nourished, well-developed patient in no acute distress Psych:  Mood and affect appropriate for situation CV: Regular rate and rhythm on monitor Respiratory:  Regular, unlabored respirations on room air GI: Abdomen soft and nontender  NEURO:  Mental Status: AA&Ox3  Speech/Language: speech is without dysarthria or aphasia.  Naming, repetition, fluency, and comprehension intact.  Cranial Nerves:  II: PERRL. Visual fields full.  III, IV, VI: EOMI. Eyelids elevate symmetrically.  V: Sensation is intact to light touch and symmetrical to face.  VII: Smile is symmetrical.  VIII: hearing intact to voice. IX, X: Palate elevates symmetrically. Phonation is normal.  KP:Dynloizm shrug 5/5. XII: tongue is midline without fasciculations. Motor: 5/5 strength to all muscle groups tested.  Tone: is normal and bulk is normal Sensation- Intact to light touch bilaterally. Extinction absent to light touch to DSS. Coordination: FTN intact bilaterally, HKS: no ataxia in BLE.No drift.  Gait- deferred  Discharge Diet       Diet   Diet heart healthy/carb modified Room service appropriate? Yes; Fluid consistency: Thin   liquids  DISCHARGE PLAN Disposition: Rehab Eliquis  (apixaban ) daily for secondary stroke prevention  Ongoing stroke risk factor control by Primary Care Physician at time of discharge Follow-up PCP Kip Righter, MD in 2 weeks. Follow-up in [facility] in 8 weeks, office to schedule an appointment.   35 minutes were spent preparing discharge.  Stevi Toberman, AGACNP-BC [facility] Pager: [phone]  ATTENDING NOTE: I reviewed above note and agree with the assessment and plan. Pt was seen and examined.   Husband at the bedside. Pt lying in bed, still complaining of HA, more at R frontal, received tylenol  and fioricet . Per record, she is on fioricet  every 8 h for the last 3 days, concerning for rebound headache. Will d/c fioricet   and put on Topamax . Continue eliuqis and statin, neuro stable. Medically ready for CIR, follow up at [facility] and also follow up with hematology.   For detailed assessment and plan, please refer to above as I have made changes wherever appropriate.   Ary Cummins, MD PhD Stroke Neurology 02/25/2024 9:43 PM

## 2024-02-26 DIAGNOSIS — I611 Nontraumatic intracerebral hemorrhage in hemisphere, cortical: Secondary | ICD-10-CM | POA: Diagnosis not present

## 2024-02-26 LAB — CBC WITH DIFFERENTIAL/PLATELET
Abs Immature Granulocytes: 0.11 K/uL — ABNORMAL HIGH (ref 0.00–0.07)
Basophils Absolute: 0 K/uL (ref 0.0–0.1)
Basophils Relative: 0 %
Eosinophils Absolute: 0.1 K/uL (ref 0.0–0.5)
Eosinophils Relative: 1 %
HCT: 40.4 % (ref 36.0–46.0)
Hemoglobin: 13.5 g/dL (ref 12.0–15.0)
Immature Granulocytes: 1 %
Lymphocytes Relative: 35 %
Lymphs Abs: 3.7 K/uL (ref 0.7–4.0)
MCH: 28.3 pg (ref 26.0–34.0)
MCHC: 33.4 g/dL (ref 30.0–36.0)
MCV: 84.7 fL (ref 80.0–100.0)
Monocytes Absolute: 0.6 K/uL (ref 0.1–1.0)
Monocytes Relative: 5 %
Neutro Abs: 6 K/uL (ref 1.7–7.7)
Neutrophils Relative %: 58 %
Platelets: 292 K/uL (ref 150–400)
RBC: 4.77 MIL/uL (ref 3.87–5.11)
RDW: 14.3 % (ref 11.5–15.5)
WBC: 10.6 K/uL — ABNORMAL HIGH (ref 4.0–10.5)
nRBC: 0 % (ref 0.0–0.2)

## 2024-02-26 LAB — GLUCOSE, CAPILLARY
Glucose-Capillary: 117 mg/dL — ABNORMAL HIGH (ref 70–99)
Glucose-Capillary: 140 mg/dL — ABNORMAL HIGH (ref 70–99)
Glucose-Capillary: 127 mg/dL — ABNORMAL HIGH (ref 70–99)
Glucose-Capillary: 111 mg/dL — ABNORMAL HIGH (ref 70–99)

## 2024-02-26 LAB — COMPREHENSIVE METABOLIC PANEL WITH GFR
ALT: 130 U/L — ABNORMAL HIGH (ref 0–44)
AST: 59 U/L — ABNORMAL HIGH (ref 15–41)
Albumin: 4 g/dL (ref 3.5–5.0)
Alkaline Phosphatase: 91 U/L (ref 38–126)
Anion gap: 11 (ref 5–15)
BUN: 17 mg/dL (ref 6–20)
CO2: 23 mmol/L (ref 22–32)
Calcium: 9.3 mg/dL (ref 8.9–10.3)
Chloride: 103 mmol/L (ref 98–111)
Creatinine, Ser: 0.67 mg/dL (ref 0.44–1.00)
GFR, Estimated: >60 mL/min
Glucose, Bld: 113 mg/dL — ABNORMAL HIGH (ref 70–99)
Potassium: 3.9 mmol/L (ref 3.5–5.1)
Sodium: 136 mmol/L (ref 135–145)
Total Bilirubin: 0.3 mg/dL (ref 0.0–1.2)
Total Protein: 7 g/dL (ref 6.5–8.1)

## 2024-02-26 NOTE — Evaluation (Signed)
 Speech Language Pathology Assessment and Plan  Patient Details  Name: Hailey [name] MRN: [id number] Date of Birth: December 01, 1965  SLP Diagnosis: Cognitive Impairments;Other (comment) (dyslexia/dysgraphia)  Rehab Potential: Good ELOS: 2 weeks    Today's Date: 02/26/2024 SLP Individual Time: 1100-1200 SLP Individual Time Calculation (min): 60 min   Hospital Problem: Principal Problem:   ICH (intracerebral hemorrhage) (HCC)  Past Medical History:  Past Medical History:  Diagnosis Date   Angina at rest    Anxiety    Chest pain    Diabetes mellitus without complication (HCC)    High cholesterol    High cholesterol    History of angina    History of TIA (transient ischemic attack) 10/11/2020   Hypercholesterolemia 10/11/2020   Hyperlipidemia    Migraine    Personal history of COVID-19 10/11/2020   Radiculopathy of cervical region    TIA (transient ischemic attack)    Transient cerebral ischemic attack    Type II diabetes mellitus (HCC) 10/11/2020   Past Surgical History:  Past Surgical History:  Procedure Laterality Date   BUNIONECTOMY     CHOLECYSTECTOMY      Assessment / Plan / Recommendation Clinical Impression   Pt is a 59 y/o female with PMH of HLD, DM, TIA, and migraines who admitted to [facility] on 02/20/24 from work with RLE hemiparesis and disorientation.  Code stroke activated by EDP.  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 Therapy ongoing and pt has been recommended for CIR.    Cognitive-Linguistic Evaluation: Patient presents with moderate deficits in the areas of problem solving, processing speed, delayed recall, sustained attention, and mild deficits in the areas of reading fluency and writing (dyslexia/dysgraphia). These deficits are influenced heavily by difficulty progressing visual information d/t stroke sequelae. Patient  demonstrates no deficits in the areas of verbal expression or auditory comprehension, however breakdowns in the written modality are stark. During basic problem solving tasks, patient does eventually achieve answer, at times independently, however processing speed is significantly slowed compared to baseline. No motor speech deficits appreciated. Patient would benefit from ongoing SLP services targeting aforementioned deficits. Patient was left in chair with alarm set and call bell within reach. SLP will continue with POC.   Skilled Therapeutic Interventions          SLP conducted skilled evaluation session to assess cognitive-linguistic function. Utilized cognistat, reading sample, writing sample, and patient and/or family interview. Full results above.   SLP Assessment  Patient will need skilled Speech Lanaguage Pathology Services during CIR admission    Recommendations  Oral Care Recommendations: Oral care BID Recommendations for Other Services: Neuropsych consult Patient destination: Home Follow up Recommendations: Outpatient SLP Equipment Recommended: None recommended by SLP    SLP Frequency 3 to 5 out of 7 days   SLP Duration  SLP Intensity  SLP Treatment/Interventions 2 weeks  Minumum of 1-2 x/day, 30 to 90 minutes  Cognitive remediation/compensation;Cueing hierarchy;Functional tasks;Therapeutic Activities;Internal/external aids;Patient/family education    Pain Pain Assessment Pain Scale: Faces Faces Pain Scale: No hurt  Prior Functioning Cognitive/Linguistic Baseline: Within functional limits Type of Home: House  Lives With: Spouse Available Help at Discharge: Family;Available 24 hours/day Vocation: Full time employment  SLP Evaluation Cognition Overall Cognitive Status: Impaired/Different from baseline Arousal/Alertness: Awake/alert Orientation Level: Oriented to place;Oriented to person;Oriented to situation Year: 2026 Day of Week: Correct Attention:  Sustained Sustained Attention: Impaired Sustained  Attention Impairment: Verbal basic;Functional basic Memory: Impaired Memory Impairment: Storage deficit;Retrieval deficit Awareness: Impaired Awareness Impairment: Intellectual impairment;Emergent impairment;Anticipatory impairment Problem Solving: Impaired Problem Solving Impairment: Verbal basic;Functional basic Executive Function: Sequencing;Organizing Reasoning: Appears intact Sequencing: Impaired Sequencing Impairment: Verbal basic;Functional basic Organizing: Impaired Organizing Impairment: Verbal basic;Functional basic Safety/Judgment: Appears intact  Comprehension Auditory Comprehension Overall Auditory Comprehension: Appears within functional limits for tasks assessed Visual Recognition/Discrimination Discrimination: Exceptions to [facility] Common Objects: Able for objects in room Reading Comprehension Reading Status: Impaired Word level: Within functional limits Sentence Level: Impaired Interfering Components: Processing time Expression Expression Primary Mode of Expression: Verbal Verbal Expression Overall Verbal Expression: Appears within functional limits for tasks assessed Common Objects: Able for objects in room Written Expression Dominant Hand: Right Written Expression: Exceptions to [facility] Self Formulation Ability: Word Effective Techniques: Effective monitoring and self-correction;Verbal/Language cues Oral Motor Oral Motor/Sensory Function Overall Oral Motor/Sensory Function: Within functional limits Motor Speech Overall Motor Speech: Appears within functional limits for tasks assessed  Care Tool Care Tool Cognition Ability to hear (with hearing aid or hearing appliances if normally used Ability to hear (with hearing aid or hearing appliances if normally used): 0. Adequate - no difficulty in normal conservation, social interaction, listening to TV   Expression of Ideas and Wants Expression of Ideas and Wants:  4. Without difficulty (complex and basic) - expresses complex messages without difficulty and with speech that is clear and easy to understand   Understanding Verbal and Non-Verbal Content Understanding Verbal and Non-Verbal Content: 4. Understands (complex and basic) - clear comprehension without cues or repetitions  Memory/Recall Ability Memory/Recall Ability : Current season;That he or she is in a hospital/hospital unit   Short Term Goals: Week 1: SLP Short Term Goal 1 (Week 1): Patient will read mildly complex sentences with increased processing speed with min assist. SLP Short Term Goal 2 (Week 1): Patient will solve basic visually presented problems with 80% accuracy given min assist. SLP Short Term Goal 3 (Week 1): Patient will write answers to basic problems with min assist for spelling accuracy and language fluency. SLP Short Term Goal 4 (Week 1): Patient will sustain attention to functional tasks for 20 minutes with min assist. SLP Short Term Goal 5 (Week 1): Patient will recall events from daily interactions with 80% accuracy given min assist.  Refer to Care Plan for Long Term Goals  Recommendations for other services: None   Discharge Criteria: Patient will be discharged from SLP if patient refuses treatment 3 consecutive times without medical reason, if treatment goals not met, if there is a change in medical status, if patient makes no progress towards goals or if patient is discharged from hospital.  The above assessment, treatment plan, treatment alternatives and goals were discussed and mutually agreed upon: by patient  Hailey [name], M.A., CCC-SLP  Hailey [name] 02/26/2024, 12:52 PM

## 2024-02-26 NOTE — Plan of Care (Signed)
 " Problem: RH Balance Goal: LTG Patient will maintain dynamic standing with ADLs (OT) Description: LTG:  Patient will maintain dynamic standing balance with assist during activities of daily living (OT)  Flowsheets (Taken 02/26/2024 1335) LTG: Pt will maintain dynamic standing balance during ADLs with: Independent   Problem: Sit to Stand Goal: LTG:  Patient will perform sit to stand in prep for activites of daily living with assistance level (OT) Description: LTG:  Patient will perform sit to stand in prep for activites of daily living with assistance level (OT) Flowsheets (Taken 02/26/2024 1335) LTG: PT will perform sit to stand in prep for activites of daily living with assistance level: Independent   Problem: RH Bathing Goal: LTG Patient will bathe all body parts with assist levels (OT) Description: LTG: Patient will bathe all body parts with assist levels (OT) Flowsheets (Taken 02/26/2024 1335) LTG: Pt will perform bathing with assistance level/cueing: Independent LTG: Position pt will perform bathing: Shower   Problem: RH Dressing Goal: LTG Patient will perform upper body dressing (OT) Description: LTG Patient will perform upper body dressing with assist, with/without cues (OT). Flowsheets (Taken 02/26/2024 1335) LTG: Pt will perform upper body dressing with assistance level of: Independent Goal: LTG Patient will perform lower body dressing w/assist (OT) Description: LTG: Patient will perform lower body dressing with assist, with/without cues in positioning using equipment (OT) Flowsheets (Taken 02/26/2024 1335) LTG: Pt will perform lower body dressing with assistance level of: Independent   Problem: RH Toileting Goal: LTG Patient will perform toileting task (3/3 steps) with assistance level (OT) Description: LTG: Patient will perform toileting task (3/3 steps) with assistance level (OT)  Flowsheets (Taken 02/26/2024 1335) LTG: Pt will perform toileting task (3/3 steps) with  assistance level: Independent   Problem: RH Vision Goal: RH LTG Vision Consulting Civil Engineer) Flowsheets (Taken 02/26/2024 1335) LTG: Vision Goals: Patient will visually compensate for right field of vision when walking in crowded environment to avoid obstacles without cueing.   Problem: RH Functional Use of Upper Extremity Goal: LTG Patient will use RT/LT upper extremity as a (OT) Description: LTG: Patient will use right/left upper extremity as a stabilizer/gross assist/diminished/nondominant/dominant level with assist, with/without cues during functional activity (OT) Flowsheets (Taken 02/26/2024 1335) LTG: Use of upper extremity in functional activities: RUE as diminished level LTG: Pt will use upper extremity in functional activity with assistance level of: Supervision/Verbal cueing   Problem: RH Toilet Transfers Goal: LTG Patient will perform toilet transfers w/assist (OT) Description: LTG: Patient will perform toilet transfers with assist, with/without cues using equipment (OT) Flowsheets (Taken 02/26/2024 1335) LTG: Pt will perform toilet transfers with assistance level of: Independent   Problem: RH Tub/Shower Transfers Goal: LTG Patient will perform tub/shower transfers w/assist (OT) Description: LTG: Patient will perform tub/shower transfers with assist, with/without cues using equipment (OT) Flowsheets (Taken 02/26/2024 1335) LTG: Pt will perform tub/shower stall transfers with assistance level of: Independent LTG: Pt will perform tub/shower transfers from: Walk in shower   Problem: RH Memory Goal: LTG Patient will demonstrate ability for day to day recall/carry over during activities of daily living with assistance level (OT) Description: LTG:  Patient will demonstrate ability for day to day recall/carry over during activities of daily living with assistance level (OT). Flowsheets (Taken 02/26/2024 1335) LTG:  Patient will demonstrate ability for day to day recall/carry over during activities  of daily living with assistance level (OT): Supervision   Problem: RH Attention Goal: LTG Patient will demonstrate this level of attention during functional activites (OT) Description:  LTG:  Patient will demonstrate this level of attention during functional activites  (OT) Flowsheets (Taken 02/26/2024 1335) Patient will demonstrate this level of attention during functional activites: Selective LTG: Patient will demonstrate this level of attention during functional activites (OT): Minimal Assistance - Patient > 75%   Problem: RH Awareness Goal: LTG: Patient will demonstrate awareness during functional activites type of (OT) Description: LTG: Patient will demonstrate awareness during functional activites type of (OT) Flowsheets (Taken 02/26/2024 1335) Patient will demonstrate awareness during functional activites type of: Anticipatory LTG: Patient will demonstrate awareness during functional activites type of (OT): Minimal Assistance - Patient > 75%   "

## 2024-02-26 NOTE — Evaluation (Signed)
 Occupational Therapy Assessment and Plan  Patient Details  Name: Hailey [name] MRN: [id number] Date of Birth: 1965/07/14  OT Diagnosis: abnormal posture, acute pain, cognitive deficits, and disturbance of vision Rehab Potential: Rehab Potential (ACUTE ONLY): Excellent ELOS: 7-8 days   Today's Date: 02/26/2024 OT Individual Time: 9152-9054 OT Individual Time Calculation (min): 58 min     Hospital Problem: Principal Problem:   ICH (intracerebral hemorrhage) (HCC)   Past Medical History:  Past Medical History:  Diagnosis Date   Angina at rest    Anxiety    Chest pain    Diabetes mellitus without complication (HCC)    High cholesterol    High cholesterol    History of angina    History of TIA (transient ischemic attack) 10/11/2020   Hypercholesterolemia 10/11/2020   Hyperlipidemia    Migraine    Personal history of COVID-19 10/11/2020   Radiculopathy of cervical region    TIA (transient ischemic attack)    Transient cerebral ischemic attack    Type II diabetes mellitus (HCC) 10/11/2020   Past Surgical History:  Past Surgical History:  Procedure Laterality Date   BUNIONECTOMY     CHOLECYSTECTOMY      Assessment & Plan Clinical Impression: Hailey [name] is a 59 year old female with PMHx of HTN, HLD, DM2, TIA, myocardial bridge and migraines, presented via EMS to [facility] on 02/20/24 with complaints of right lower extremity hemiparesis and disorientation.  Per chart review patient was at work when symptoms started, LKW around 1330.  Code stroke was activated by EDP and CTH revealed a left parietal lobe intracranial hemorrhage measuring 4.7 ml with adjacent SAH and sulcal effacement with no midline shift.  Patient was noted to have right leg rhythmic twitching concerning for focal seizure.  She was given IV Keppra  load, Ativan , and Vimpat  with resolution.  EEG revealed continuous slowing in the left hemisphere and generalized intermittent slowing suggestive of cortical  dysfunction and no seizures or elliptic form dischargesPatient transferred to CIR on 02/25/2024 .    Patient currently requires min with basic self-care skills secondary to unbalanced muscle activation, decreased coordination, and decreased motor planning, decreased visual perceptual skills and field cut, decreased attention to right, decreased initiation, decreased attention, decreased awareness, decreased problem solving, decreased safety awareness, decreased memory, and delayed processing, and decreased standing balance, decreased postural control, hemiplegia, and decreased balance strategies.  Prior to hospitalization, patient could complete BADL/IADL with independent .  Patient will benefit from skilled intervention to decrease level of assist with basic self-care skills and increase independence with basic self-care skills prior to discharge home with care partner.  Anticipate patient will require intermittent supervision and follow up outpatient.  OT - End of Session Activity Tolerance: Decreased this session Endurance Deficit: Yes Endurance Deficit Description: reports physical fatigue after getting dressed OT Assessment Rehab Potential (ACUTE ONLY): Excellent OT Patient demonstrates impairments in the following area(s): Balance;Motor;Sensory;Behavior;Cognition;Vision;Perception;Endurance;Safety OT Basic ADL's Functional Problem(s): Bathing;Dressing;Toileting OT Transfers Functional Problem(s): Toilet;Tub/Shower OT Additional Impairment(s): Fuctional Use of Upper Extremity OT Plan OT Intensity: Minimum of 1-2 x/day, 45 to 90 minutes OT Frequency: 5 out of 7 days OT Duration/Estimated Length of Stay: 7-8 days OT Treatment/Interventions: Balance/vestibular training;Community reintegration;Neuromuscular re-education;Patient/family education;Self Care/advanced ADL retraining;Therapeutic Exercise;Splinting/orthotics;UE/LE Coordination activities;Discharge planning;Cognitive  remediation/compensation;DME/adaptive equipment instruction;Pain management;Therapeutic Activities;UE/LE Strength taining/ROM;Visual/perceptual remediation/compensation OT Self Feeding Anticipated Outcome(s): Independent OT Basic Self-Care Anticipated Outcome(s): Mod I OT Toileting Anticipated Outcome(s): Mod I OT Bathroom Transfers Anticipated Outcome(s): Mod I OT Recommendation Recommendations for Other Services:  Neuropsych consult Patient destination: Home Follow Up Recommendations: Outpatient OT Equipment Recommended: To be determined   OT Evaluation Precautions/Restrictions  Precautions Precautions: Fall Restrictions Weight Bearing Restrictions Per Provider Order: No   Pain Pain Assessment Pain Scale: Faces Pain Score: 0-No pain Faces Pain Scale: No hurt Home Living/Prior Functioning Home Living Family/patient expects to be discharged to:: Private residence Living Arrangements: Spouse/significant other Available Help at Discharge: Family, Available 24 hours/day Type of Home: House Home Access: Stairs to enter Secretary/administrator of Steps: 2 Entrance Stairs-Rails: Right Home Layout: One level Bathroom Shower/Tub: Health Visitor: Standard  Lives With: Spouse IADL History Homemaking Responsibilities: Yes Meal Prep Responsibility: Primary Current License: Yes Mode of Transportation: Car Occupation: Full time employment Type of Occupation: Works at [employer] and Hobbies: crafts Prior Function Level of Independence: Independent with basic ADLs, Independent with gait  Able to Take Stairs?: Yes Driving: Yes Vocation: Full time employment Vocation Requirements: lifting, breaking down pallet/ boxes, loading Leisure: Hobbies-yes (Comment) (arts and crafts) Vision Baseline Vision/History: 1 Wears glasses Ability to See in Adequate Light: 1 Impaired Patient Visual Report: Eye fatigue/eye pain/headache;Peripheral vision impairment Vision  Assessment?: Vision impaired- to be further tested in functional context Perception  Perception: Impaired Praxis Praxis: WFL Cognition Cognition Overall Cognitive Status: Impaired/Different from baseline Arousal/Alertness: Awake/alert Orientation Level: Person;Place;Situation Person: Oriented Place: Oriented Situation: Oriented Memory: Impaired Memory Impairment: Retrieval deficit;Storage deficit Attention: Sustained Sustained Attention: Impaired Sustained Attention Impairment: Functional basic Awareness: Impaired Awareness Impairment: Emergent impairment Problem Solving: Impaired Problem Solving Impairment: Functional basic Executive Function: Organizing;Initiating Reasoning: Impaired Reasoning Impairment: Functional basic Sequencing: Impaired Sequencing Impairment: Functional basic Organizing: Impaired Organizing Impairment: Functional basic Initiating: Impaired Initiating Impairment: Functional basic Safety/Judgment: Impaired Brief Interview for Mental Status (BIMS) Repetition of Three Words (First Attempt): 3 Temporal Orientation: Year: Correct Temporal Orientation: Month: Accurate within 5 days Temporal Orientation: Day: Incorrect Recall: Sock: Yes, no cue required Recall: Blue: Yes, no cue required Recall: Bed: Yes, no cue required BIMS Summary Score: 14 Sensation Sensation Light Touch: Impaired by gross assessment Hot/Cold: Impaired by gross assessment Proprioception: Impaired by gross assessment Stereognosis: Impaired by gross assessment Coordination Gross Motor Movements are Fluid and Coordinated: No Fine Motor Movements are Fluid and Coordinated: No Coordination and Movement Description: dense hemisensory loss Finger Nose Finger Test: significant undershooting 9 Hole Peg Test: Box and Blocks 42 left (non dominant) & 24 right Motor  Motor Motor: Motor impersistence Motor - Skilled Clinical Observations: decreased sustained activation/ attention  to RUE  Trunk/Postural Assessment  Cervical Assessment Cervical Assessment: Exceptions to [facility] (Head turned right in bed) Thoracic Assessment Thoracic Assessment: Within Functional Limits Lumbar Assessment Lumbar Assessment: Within Functional Limits Postural Control Postural Control: Deficits on evaluation Protective Responses: slight posterior bias initially when upright  Balance Balance Balance Assessed: Yes Static Sitting Balance Static Sitting - Balance Support: No upper extremity supported;Feet supported Static Sitting - Level of Assistance: 7: Independent Dynamic Sitting Balance Dynamic Sitting - Balance Support: No upper extremity supported;Feet supported;During functional activity Dynamic Sitting - Level of Assistance: 5: Stand by assistance Static Standing Balance Static Standing - Balance Support: No upper extremity supported;During functional activity Static Standing - Level of Assistance: 5: Stand by assistance Dynamic Standing Balance Dynamic Standing - Balance Support: No upper extremity supported;During functional activity Dynamic Standing - Level of Assistance: 4: Min assist Extremity/Trunk Assessment RUE Assessment RUE Assessment: Exceptions to [facility] Active Range of Motion (AROM) Comments: Has full active movement but poorly graded control (decreased proprioceptive/ kinesthetic sense)  RUE Body System: Neuro Brunstrum levels for arm and hand: Arm;Hand Brunstrum level for arm: Stage V Relative Independence from Synergy Brunstrum level for hand: Stage VI Isolated joint movements LUE Assessment LUE Assessment: Within Functional Limits  Care Tool Care Tool Self Care Eating Eating activity did not occur: Refused      Oral Care    Oral Care Assist Level: Minimal Assistance - Patient > 75%    Bathing   Body parts bathed by patient: Right arm;Chest;Abdomen;Front perineal area;Buttocks;Right upper leg;Left upper leg;Left lower leg;Face;Right lower leg     Assist  Level: Minimal Assistance - Patient > 75%    Upper Body Dressing(including orthotics)   What is the patient wearing?: Pull over shirt   Assist Level: Set up assist    Lower Body Dressing (excluding footwear)   What is the patient wearing?: Underwear/pull up;Pants Assist for lower body dressing: Supervision/Verbal cueing    Putting on/Taking off footwear   What is the patient wearing?: Socks;Shoes Assist for footwear: Supervision/Verbal cueing       Care Tool Toileting Toileting activity   Assist for toileting: Minimal Assistance - Patient > 75%     Care Tool Bed Mobility Roll left and right activity   Roll left and right assist level: Supervision/Verbal cueing    Sit to lying activity   Sit to lying assist level: Contact Guard/Touching assist    Lying to sitting on side of bed activity   Lying to sitting on side of bed assist level: the ability to move from lying on the back to sitting on the side of the bed with no back support.: Contact Guard/Touching assist     Care Tool Transfers Sit to stand transfer   Sit to stand assist level: Contact Guard/Touching assist    Chair/bed transfer   Chair/bed transfer assist level: Minimal Assistance - Patient > 75%     Toilet transfer   Assist Level: Minimal Assistance - Patient > 75%     Care Tool Cognition  Expression of Ideas and Wants Expression of Ideas and Wants: 4. Without difficulty (complex and basic) - expresses complex messages without difficulty and with speech that is clear and easy to understand  Understanding Verbal and Non-Verbal Content Understanding Verbal and Non-Verbal Content: 4. Understands (complex and basic) - clear comprehension without cues or repetitions   Memory/Recall Ability Memory/Recall Ability : Current season;That he or she is in a hospital/hospital unit   Refer to Care Plan for Long Term Goals  SHORT TERM GOAL WEEK 1 OT Short Term Goal 1 (Week 1): STG=LTG due to LOS  Recommendations for  other services: Neuropsych   Skilled Therapeutic Intervention ADL ADL Eating: Unable to assess Grooming: Setup;Supervision/safety Where Assessed-Grooming: Edge of bed Upper Body Bathing: Supervision/safety Where Assessed-Upper Body Bathing: Shower Lower Body Bathing: Supervision/safety Where Assessed-Lower Body Bathing: Shower Upper Body Dressing: Setup Where Assessed-Upper Body Dressing: Edge of bed Lower Body Dressing: Setup;Supervision/safety Where Assessed-Lower Body Dressing: Edge of bed Toileting: Minimal assistance Where Assessed-Toileting: Teacher, Adult Education: Curator Method: Insurance Claims Handler: Unable to assess Tub/Shower Transfer Method: Unable to assess Film/video Editor: Close supervision Film/video Editor Method: Ambulating Mobility  Bed Mobility Bed Mobility: Rolling Right;Right Sidelying to Sit Rolling Right: Supervision/verbal cueing Right Sidelying to Sit: Supervision/Verbal cueing Transfers Sit to Stand: Contact Guard/Touching assist Stand to Sit: Contact Guard/Touching assist   Discharge Criteria: Patient will be discharged from OT if patient refuses treatment 3 consecutive times without medical reason, if treatment  goals not met, if there is a change in medical status, if patient makes no progress towards goals or if patient is discharged from hospital.  The above assessment, treatment plan, treatment alternatives and goals were discussed and mutually agreed upon: by patient and by family  Emersen Mascari M 02/26/2024, 1:30 PM

## 2024-02-26 NOTE — Plan of Care (Signed)
" °  Problem: RH Problem Solving Goal: LTG Patient will demonstrate problem solving for (SLP) Description: LTG:  Patient will demonstrate problem solving for basic/complex daily situations with cues  (SLP) Flowsheets (Taken 02/26/2024 1244) LTG: Patient will demonstrate problem solving for (SLP): Basic daily situations LTG Patient will demonstrate problem solving for: Supervision   Problem: RH Memory Goal: LTG Patient will use memory compensatory aids to (SLP) Description: LTG:  Patient will use memory compensatory aids to recall biographical/new, daily complex information with cues (SLP) Flowsheets (Taken 02/26/2024 1244) LTG: Patient will use memory compensatory aids to (SLP): Supervision   Problem: RH Attention Goal: LTG Patient will demonstrate this level of attention during functional activites (SLP) Description: LTG:  Patient will will demonstrate this level of attention during functional activites (SLP) Flowsheets (Taken 02/26/2024 1244) Patient will demonstrate during cognitive/linguistic activities the attention type of: Sustained Patient will demonstrate this level of attention during cognitive/linguistic activities in: Controlled LTG: Patient will demonstrate this level of attention during cognitive/linguistic activities with assistance of (SLP): Supervision Number of minutes patient will demonstrate attention during cognitive/linguistic activities: 20   Problem: RH Pre-functional/Other (Specify) Goal: RH LTG SLP (Specify) 1 Description: RH LTG SLP (Specify) 1 Flowsheets (Taken 02/26/2024 1244) LTG: Other SLP (Specify) 1: Patient will read functional paragraph-level information with supervision assist   "

## 2024-02-26 NOTE — Progress Notes (Signed)
 Inpatient Rehabilitation  Patient information reviewed and entered into eRehab system by [facility]. Karen Kays., CCC/SLP, PPS Coordinator.  Information including medical coding, functional ability and quality indicators will be reviewed and updated through discharge.

## 2024-02-26 NOTE — Progress Notes (Signed)
 Inpatient Rehabilitation Center Individual Statement of Services  Patient Name:  Hailey [name]  Date:  02/26/2024  Welcome to the Inpatient Rehabilitation Center.  Our goal is to provide you with an individualized program based on your diagnosis and situation, designed to meet your specific needs.  With this comprehensive rehabilitation program, you will be expected to participate in at least 3 hours of rehabilitation therapies Monday-Friday, with modified therapy programming on the weekends.  Your rehabilitation program will include the following services:  Physical Therapy (PT), Occupational Therapy (OT), Speech Therapy (ST), 24 hour per day rehabilitation nursing, Therapeutic Recreaction (TR), Care Coordinator, Rehabilitation Medicine, Nutrition Services, and Pharmacy Services  Weekly team conferences will be held on Wednesday to discuss your progress.  Your Inpatient Rehabilitation Care Coordinator will talk with you frequently to get your input and to update you on team discussions.  Team conferences with you and your family in attendance may also be held.  Expected length of stay: 10-12 days  Overall anticipated outcome: Supervision  Depending on your progress and recovery, your program may change. Your Inpatient Rehabilitation Care Coordinator will coordinate services and will keep you informed of any changes. Your Inpatient Rehabilitation Care Coordinator's name and contact numbers are listed  below.  The following services may also be recommended but are not provided by the Inpatient Rehabilitation Center:  Driving Evaluations Home Health Rehabiltiation Services Outpatient Rehabilitation Services Vocational Rehabilitation   Arrangements will be made to provide these services after discharge if needed.  Arrangements include referral to agencies that provide these services.  Your insurance has been verified to be: HULAN / [employer]ETNA NAP  Your primary doctor is:  Kip Righter, MD    Pertinent information will be shared with your doctor and your insurance company.  Inpatient Rehabilitation Care Coordinator:  Di'Asia Loreli SIERRAS [phone] or ELIGAH BRINKS  Information discussed with and copy given to patient by: Waverly Loreli, 02/26/2024, 9:58 AM

## 2024-02-26 NOTE — Plan of Care (Signed)
" °  Problem: RH Balance Goal: LTG Patient will maintain dynamic standing balance (PT) Description: LTG:  Patient will maintain dynamic standing balance with assistance during mobility activities (PT) Flowsheets (Taken 02/26/2024 1554) LTG: Pt will maintain dynamic standing balance during mobility activities with:: Independent with assistive device    Problem: Sit to Stand Goal: LTG:  Patient will perform sit to stand with assistance level (PT) Description: LTG:  Patient will perform sit to stand with assistance level (PT) Flowsheets (Taken 02/26/2024 1554) LTG: PT will perform sit to stand in preparation for functional mobility with assistance level: Independent with assistive device   Problem: RH Bed Mobility Goal: LTG Patient will perform bed mobility with assist (PT) Description: LTG: Patient will perform bed mobility with assistance, with/without cues (PT). Flowsheets (Taken 02/26/2024 1554) LTG: Pt will perform bed mobility with assistance level of: Independent with assistive device    Problem: RH Bed to Chair Transfers Goal: LTG Patient will perform bed/chair transfers w/assist (PT) Description: LTG: Patient will perform bed to chair transfers with assistance (PT). Flowsheets (Taken 02/26/2024 1554) LTG: Pt will perform Bed to Chair Transfers with assistance level: Independent with assistive device    Problem: RH Car Transfers Goal: LTG Patient will perform car transfers with assist (PT) Description: LTG: Patient will perform car transfers with assistance (PT). Flowsheets (Taken 02/26/2024 1554) LTG: Pt will perform car transfers with assist:: Independent with assistive device    Problem: RH Ambulation Goal: LTG Patient will ambulate in controlled environment (PT) Description: LTG: Patient will ambulate in a controlled environment, # of feet with assistance (PT). Flowsheets (Taken 02/26/2024 1554) LTG: Pt will ambulate in controlled environ  assist needed:: Independent with assistive  device LTG: Ambulation distance in controlled environment: 150' Goal: LTG Patient will ambulate in home environment (PT) Description: LTG: Patient will ambulate in home environment, # of feet with assistance (PT). Flowsheets (Taken 02/26/2024 1554) LTG: Pt will ambulate in home environ  assist needed:: Independent with assistive device LTG: Ambulation distance in home environment: 50'   Problem: RH Stairs Goal: LTG Patient will ambulate up and down stairs w/assist (PT) Description: LTG: Patient will ambulate up and down # of stairs with assistance (PT) Flowsheets (Taken 02/26/2024 1554) LTG: Pt will ambulate up/down stairs assist needed:: Independent with assistive device LTG: Pt will  ambulate up and down number of stairs: at least 12 with LRAD   "

## 2024-02-26 NOTE — Progress Notes (Signed)
 "                                                        PROGRESS NOTE   Subjective/Complaints: She is feeling better today Headache is currently improved, though was present last night. She would like to keep Fioricet  as needed  ROS: +intermittent headaches   Objective:   No results found. Recent Labs    02/25/24 0158 02/26/24 0637  WBC 9.0 10.6*  HGB 13.1 13.5  HCT 38.8 40.4  PLT 297 292   Recent Labs    02/26/24 0637  NA 136  K 3.9  CL 103  CO2 23  GLUCOSE 113*  BUN 17  CREATININE 0.67  CALCIUM  9.3    Intake/Output Summary (Last 24 hours) at 02/26/2024 0954 Last data filed at 02/26/2024 0800 Gross per 24 hour  Intake 720 ml  Output --  Net 720 ml        Physical Exam: Vital Signs Blood pressure (!) 111/58, pulse 73, temperature 98.2 F (36.8 C), temperature source Oral, resp. rate 18, height 5' 1 (1.549 m), weight 96.4 kg, SpO2 96%. Gen: no distress, normal appearing HEENT: oral mucosa pink and moist, NCAT Cardio: Reg rate Chest: normal effort, normal rate of breathing Abd: soft, non-distended Ext: no edema Psych: pleasant, normal affect Skin: intact Neuro: Alert and oriented x3 Musculoskeletal: RYE 3/5, RLE 4/5, LUE and LLE 5/5. Sensation is decreased in right side.    Assessment/Plan: 1. Functional deficits which require 3+ hours per day of interdisciplinary therapy in a comprehensive inpatient rehab setting. Physiatrist is providing close team supervision and 24 hour management of active medical problems listed below. Physiatrist and rehab team continue to assess barriers to discharge/monitor patient progress toward functional and medical goals  Care Tool:  Bathing              Bathing assist       Upper Body Dressing/Undressing Upper body dressing        Upper body assist      Lower Body Dressing/Undressing Lower body dressing            Lower body assist       Toileting Toileting    Toileting assist        Transfers Chair/bed transfer  Transfers assist           Locomotion Ambulation   Ambulation assist              Walk 10 feet activity   Assist           Walk 50 feet activity   Assist           Walk 150 feet activity   Assist           Walk 10 feet on uneven surface  activity   Assist           Wheelchair     Assist               Wheelchair 50 feet with 2 turns activity    Assist            Wheelchair 150 feet activity     Assist          Blood pressure (!) 111/58, pulse 73, temperature 98.2 F (36.8 C), temperature source Oral,  resp. rate 18, height 5' 1 (1.549 m), weight 96.4 kg, SpO2 96%.  Medical Problem List and Plan: 1. Functional deficits secondary to left parietal ICH             -patient may  shower             -ELOS/Goals: 10-12 days, supervision goals with  PT, OT, SLP  This patient is capable of making decisions on her own behalf.   2.  Antithrombotics: -DVT/anticoagulation:  Mechanical:  Antiembolism stockings, knee (TED hose) Bilateral lower extremities Pharmaceutical: Eliquis              -antiplatelet therapy: n/a  3. Pain Management/headaches:   -pt has remote hx of migraines. Says these headaches are similar in quality and intensity  -begin trial of topamax  -continue Fioricet  but limit d/t potential rebound h/a - Tylenol  as needed. -recommended requesting ice packs for headaches  4. Transaminitis: repeat CMP ordered for tomorrow  5. 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   6.  Seizure: LTM EEG no seizure, continue Keppra  1000 mg twice daily.  7.  Hypotension: continue to monitor BP TID  8. HLD: No meds PTA. LDL 140 goal <70. Hold statin given elevated LFTs  12. DM2: HgbA1c 6.2, goal < 7.0 Monitor glucose ac/hs with SSI Novolog .   13.  Class 3 obesity:  BMI 41.61 kg/m educate on diet and weight loss to promote overall health and mobility. Check magnesium  and vitamin D  levels tomorrow  14. Leukocytosis: repeat CBC tomorrow to trend  LOS: 1 days A FACE TO FACE EVALUATION WAS PERFORMED  Sven SQUIBB Lyndia Bury 02/26/2024, 9:54 AM     "

## 2024-02-26 NOTE — Progress Notes (Signed)
 Physical Therapy Assessment and Plan  Patient Details  Name: Hailey [name] MRN: [id number] Date of Birth: [birth year]  PT Diagnosis: Difficulty walking, Hemiparesis dominant, and Impaired sensation Rehab Potential: Good ELOS: 5-7 days   Today's Date: 02/26/2024 PT Individual Time: 8497-8386 PT Individual Time Calculation (min): 71 min    Hospital Problem: Principal Problem:   ICH (intracerebral hemorrhage) (HCC)   Past Medical History:  Past Medical History:  Diagnosis Date   Angina at rest    Anxiety    Chest pain    Diabetes mellitus without complication (HCC)    High cholesterol    High cholesterol    History of angina    History of TIA (transient ischemic attack) 10/11/2020   Hypercholesterolemia 10/11/2020   Hyperlipidemia    Migraine    Personal history of COVID-19 10/11/2020   Radiculopathy of cervical region    TIA (transient ischemic attack)    Transient cerebral ischemic attack    Type II diabetes mellitus (HCC) 10/11/2020   Past Surgical History:  Past Surgical History:  Procedure Laterality Date   BUNIONECTOMY     CHOLECYSTECTOMY      Assessment & Plan Clinical Impression: Patient is a 59 year old female with PMHx of HTN, HLD, DM2, TIA, myocardial bridge and migraines, presented via EMS to [facility] on 02/20/24 with complaints of right lower extremity hemiparesis and disorientation.  Per chart review patient was at work when symptoms started, LKW around 1330.  Code stroke was activated by EDP and CTH revealed a left parietal lobe intracranial hemorrhage measuring 4.7 ml with adjacent SAH and sulcal effacement with no midline shift.  Patient was noted to have right leg rhythmic twitching concerning for focal seizure.  She was given IV Keppra  load, Ativan , and Vimpat  with resolution.  EEG revealed continuous slowing in the left hemisphere and generalized intermittent slowing suggestive of cortical dysfunction and no seizures or elliptic form discharges.   Keppra  continued twice daily. 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  Serial CT head scans showed stable hematoma.  Hypercoagulable workup pending, ANA and cardiolipin antibodies were negative.    For blood pressure control she received Cleviprex .  2D echo EF 55 to 60%.  Bilateral lower extremity ultrasound revealed no DVT.  VTE prophylaxis heparin  with transition to Eliquis  at discharge.  She was placed on statin therapy Lipitor 40 mg daily.  Prior to arrival the patient was independent without use of DME and working at [employer].  She lives with her spouse in a 1 level home, there are 3 steps to enter.  She currently requires mod assist +2 with mobility and basic ADLs, transfers with rolling walker. Therapy evaluations completed due to patient decreased functional mobility was admitted for a comprehensive rehab program.Patient transferred to CIR on 02/25/2024 .   Patient currently requires min with mobility secondary to muscle weakness, unbalanced muscle activation and decreased coordination, and decreased standing balance and decreased balance strategies.  Prior to hospitalization, patient was independent  with mobility and lived with Spouse in a House home.  Home access is 5-6Stairs to enter.  Patient will benefit from skilled PT intervention to maximize safe functional mobility, minimize fall risk, and decrease caregiver burden for planned discharge home with intermittent assist.  Anticipate patient will benefit from follow up OP at discharge.  PT - End of Session Activity Tolerance: Tolerates 10 - 20 min activity with multiple rests Endurance Deficit: Yes Endurance Deficit Description:  reports physical fatigue after getting dressed PT Assessment Rehab Potential (ACUTE/IP ONLY): Good PT Barriers to Discharge: Home environment access/layout;Lack of/limited family support;Insurance for SNF coverage PT Patient demonstrates  impairments in the following area(s): Balance;Endurance;Motor;Sensory;Safety;Pain PT Transfers Functional Problem(s): Bed Mobility;Car;Bed to Chair PT Locomotion Functional Problem(s): Ambulation;Stairs PT Plan PT Intensity: Minimum of 1-2 x/day ,45 to 90 minutes PT Frequency: 5 out of 7 days PT Duration Estimated Length of Stay: 5-7 days PT Treatment/Interventions: Ambulation/gait training;Cognitive remediation/compensation;Discharge planning;DME/adaptive equipment instruction;Functional mobility training;Pain management;Psychosocial support;Splinting/orthotics;Therapeutic Activities;Visual/perceptual remediation/compensation;UE/LE Strength taining/ROM;UE/LE Coordination activities;Therapeutic Exercise;Stair training;Skin care/wound management;Patient/family education;Neuromuscular re-education;Functional electrical stimulation;Disease management/prevention;Community reintegration;Balance/vestibular training PT Transfers Anticipated Outcome(s): mod I PT Locomotion Anticipated Outcome(s): mod I PT Recommendation Recommendations for Other Services: Neuropsych consult Follow Up Recommendations: Outpatient PT Patient destination: Home Equipment Recommended: To be determined   PT Evaluation Precautions/Restrictions Precautions Precautions: Fall Restrictions Weight Bearing Restrictions Per Provider Order: No Pain Interference Pain Interference Pain Effect on Sleep: 4. Almost constantly Pain Interference with Therapy Activities: 4. Almost constantly Pain Interference with Day-to-Day Activities: 4. Almost constantly Home Living/Prior Functioning Home Living Living Arrangements: Spouse/significant other Available Help at Discharge: Family;Available 24 hours/day Type of Home: House Home Access: Stairs to enter [employer] of Steps: 5-6 Entrance Stairs-Rails: Left Home Layout: Two level;Able to live on main level with bedroom/bathroom Bathroom Shower/Tub: Architectural Technologist: Standard  Lives With: Spouse Prior Function Level of Independence: Independent with basic ADLs;Independent with gait;Independent with transfers  Able to Take Stairs?: Yes Driving: Yes Vocation: Full time employment Vocation Requirements: lifting, breaking down pallet/ boxes, loading Leisure: Hobbies-yes (Comment) (arts and crafts) Vision/Perception  Vision - History Ability to See in Adequate Light: 1 Impaired Perception Perception: Impaired Preception Impairment Details: Inattention/Neglect;Spatial orientation Praxis Praxis: WFL  Cognition Overall Cognitive Status: Impaired/Different from baseline Arousal/Alertness: Awake/alert Orientation Level: Oriented to place;Oriented to person;Oriented to situation Year: 2026 Day of Week: Correct Attention: Sustained Sustained Attention: Impaired Sustained Attention Impairment: Functional basic Memory: Impaired Memory Impairment: Retrieval deficit;Storage deficit Awareness: Impaired Awareness Impairment: Emergent impairment Problem Solving: Impaired Problem Solving Impairment: Functional basic Executive Function: Organizing;Initiating Reasoning: Impaired Reasoning Impairment: Functional basic Sequencing: Impaired Sequencing Impairment: Functional basic Organizing: Impaired Organizing Impairment: Functional basic Initiating: Impaired Initiating Impairment: Functional basic Safety/Judgment: Impaired Sensation Sensation Light Touch: Impaired by gross assessment Hot/Cold: Impaired by gross assessment Proprioception: Impaired by gross assessment Stereognosis: Impaired by gross assessment Coordination Gross Motor Movements are Fluid and Coordinated: No Fine Motor Movements are Fluid and Coordinated: No Coordination and Movement Description: dense hemisensory loss Finger Nose Finger Test: significant undershooting 9 Hole Peg Test: Box and Blocks 42 left (non dominant) & 24 right Motor  Motor Motor:  Motor impersistence Motor - Skilled Clinical Observations: decreased sustained activation/ attention to RUE   Trunk/Postural Assessment  Cervical Assessment Cervical Assessment: Exceptions to [facility] (Head turned right in bed) Thoracic Assessment Thoracic Assessment: Within Functional Limits Lumbar Assessment Lumbar Assessment: Within Functional Limits Postural Control Postural Control: Deficits on evaluation Protective Responses: slight posterior bias initially when upright  Balance Balance Balance Assessed: Yes Standardized Balance Assessment Standardized Balance Assessment: Berg Balance Test Berg Balance Test Sit to Stand: Able to stand without using hands and stabilize independently Standing Unsupported: Able to stand safely 2 minutes Sitting with Back Unsupported but Feet Supported on Floor or Stool: Able to sit safely and securely 2 minutes Stand to Sit: Sits safely with minimal use of hands Transfers: Able to transfer safely, definite need of hands Standing Unsupported with Eyes Closed: Able to stand  10 seconds safely Standing Ubsupported with Feet Together: Able to place feet together independently and stand 1 minute safely From Standing, Reach Forward with Outstretched Arm: Can reach forward >12 cm safely (5) From Standing Position, Pick up Object from Floor: Able to pick up shoe, needs supervision From Standing Position, Turn to Look Behind Over each Shoulder: Looks behind one side only/other side shows less weight shift Turn 360 Degrees: Able to turn 360 degrees safely but slowly Standing Unsupported, Alternately Place Feet on Step/Stool: Able to complete >2 steps/needs minimal assist Standing Unsupported, One Foot in Front: Able to take small step independently and hold 30 seconds Standing on One Leg: Able to lift leg independently and hold equal to or more than 3 seconds Total Score: 43 Static Sitting Balance Static Sitting - Balance Support: No upper extremity  supported;Feet supported Static Sitting - Level of Assistance: 7: Independent Dynamic Sitting Balance Dynamic Sitting - Balance Support: No upper extremity supported;Feet supported;During functional activity Dynamic Sitting - Level of Assistance: 5: Stand by assistance Static Standing Balance Static Standing - Balance Support: No upper extremity supported;During functional activity Static Standing - Level of Assistance: 5: Stand by assistance Dynamic Standing Balance Dynamic Standing - Balance Support: No upper extremity supported;During functional activity Dynamic Standing - Level of Assistance: 4: Min assist Extremity Assessment  RUE Assessment RUE Assessment: Exceptions to [facility] Active Range of Motion (AROM) Comments: Has full active movement but poorly graded control (decreased proprioceptive/ kinesthetic sense) RUE Body System: Neuro Brunstrum levels for arm and hand: Arm;Hand Brunstrum level for arm: Stage V Relative Independence from Synergy Brunstrum level for hand: Stage VI Isolated joint movements LUE Assessment LUE Assessment: Within Functional Limits      Care Tool Care Tool Bed Mobility Roll left and right activity   Roll left and right assist level: Supervision/Verbal cueing    Sit to lying activity   Sit to lying assist level: Supervision/Verbal cueing    Lying to sitting on side of bed activity   Lying to sitting on side of bed assist level: the ability to move from lying on the back to sitting on the side of the bed with no back support.: Supervision/Verbal cueing     Care Tool Transfers Sit to stand transfer   Sit to stand assist level: Supervision/Verbal cueing    Chair/bed transfer   Chair/bed transfer assist level: Contact Guard/Touching assist    Car transfer   Car transfer assist level: Contact Guard/Touching assist      Care Tool Locomotion Ambulation   Assist level: Contact Guard/Touching assist Assistive device: No Device Max distance: 200'   Walk 10 feet activity   Assist level: Contact Guard/Touching assist Assistive device: No Device   Walk 50 feet with 2 turns activity   Assist level: Contact Guard/Touching assist Assistive device: No Device  Walk 150 feet activity   Assist level: Contact Guard/Touching assist Assistive device: No Device  Walk 10 feet on uneven surfaces activity   Assist level: Contact Guard/Touching assist    Stairs   Assist level: Minimal Assistance - Patient > 75% Stairs assistive device: 1 hand rail Max number of stairs: 8  Walk up/down 1 step activity   Walk up/down 1 step (curb) assist level: Minimal Assistance - Patient > 75% Walk up/down 1 step or curb assistive device: 1 hand rail  Walk up/down 4 steps activity   Walk up/down 4 steps assist level: Minimal Assistance - Patient > 75% Walk up/down 4 steps assistive device: 1 hand  rail  Walk up/down 12 steps activity Walk up/down 12 steps activity did not occur: Safety/medical concerns (fatigue)      Pick up small objects from floor   Pick up small object from the floor assist level: Minimal Assistance - Patient > 75%    Wheelchair Is the patient using a wheelchair?: No          Wheel 50 feet with 2 turns activity      Wheel 150 feet activity        Refer to Care Plan for Long Term Goals  SHORT TERM GOAL WEEK 1 PT Short Term Goal 1 (Week 1): STG = LTG due to ELOS  Recommendations for other services: Neuropsych  Skilled Therapeutic Intervention Mobility Bed Mobility Bed Mobility: Rolling Right;Right Sidelying to Sit Rolling Right: Supervision/verbal cueing Right Sidelying to Sit: Supervision/Verbal cueing Transfers Transfers: Sit to Stand;Stand to Sit;Stand Pivot Transfers Sit to Stand: Contact Guard/Touching assist Stand to Sit: Contact Guard/Touching assist Stand Pivot Transfers: Minimal Assistance - Patient > 75% Stand Pivot Transfer Details: Tactile cues for posture;Tactile cues for weight shifting Transfer  (Assistive device): None Locomotion  Gait Ambulation: Yes Gait Assistance: Contact Guard/Touching assist Gait Distance (Feet): 200 Feet Assistive device: None Gait Gait: Yes Gait Pattern: Within Functional Limits Stairs / Additional Locomotion Stairs: Yes Stairs Assistance: Contact Guard/Touching assist Stair Management Technique: One rail Left;Step to pattern;Forwards Number of Stairs: 8 Height of Stairs: 6 Wheelchair Mobility Wheelchair Mobility: No    Treatment: Pt lying in bed to start, her husband at the bedside. Pt in agreement to therapy evaluation. Pt reports headache 7/10 that has been present since CVA - RN is aware who provides medication at start of session. Pt completed functional mobility as outlined above, performing at a CGA to supervision level without the use of an AD. Her BERG score was 43/56, indicating an increased falls risk. She presents with R sided weakness ( UE > LE) with sensory involvement. She also has questionable R sided vision impairment from her CVA as she reports deficits in peripheral vision which warrents further evaluation. She finished session on the Nustep for 8 minutes at L5 resistance using BUE/BLE for general cardiovascular endurance and strengthening. Ended session in her room, needs met.   Instructed pt in results of PT evaluation as detailed above, PT POC, rehab potential, rehab goals, and discharge recommendations. Additionally discussed CIR's policies regarding fall safety and use of chair alarm and/or quick release belt. Pt verbalized understanding and in agreement. Will update pt's family members as they become available.   Discharge Criteria: Patient will be discharged from PT if patient refuses treatment 3 consecutive times without medical reason, if treatment goals not met, if there is a change in medical status, if patient makes no progress towards goals or if patient is discharged from hospital.  The above assessment, treatment plan,  treatment alternatives and goals were discussed and mutually agreed upon: by patient and by family  [name]  PT, DPT, CSRS 02/26/2024, 4:02 PM

## 2024-02-26 NOTE — IPOC Note (Signed)
 Overall Plan of Care [facility]) Patient Details Name: Hailey [name] MRN: [id number] DOB: 1965/05/27  Admitting Diagnosis: ICH (intracerebral hemorrhage) [facility])  Hospital Problems: Principal Problem:   ICH (intracerebral hemorrhage) (HCC)     Functional Problem List: Nursing Endurance, Medication Management, Pain, Safety  PT Balance, Endurance, Motor, Sensory, Safety, Pain  OT Balance, Motor, Sensory, Behavior, Cognition, Vision, Perception, Endurance, Safety  SLP Cognition  TR         Basic ADLs: OT Bathing, Dressing, Toileting     Advanced  ADLs: OT       Transfers: PT Bed Mobility, Car, Bed to Chair  OT Toilet, Tub/Shower     Locomotion: PT Ambulation, Stairs     Additional Impairments: OT Fuctional Use of Upper Extremity  SLP Social Cognition   Problem Solving, Memory, Attention, Awareness  TR      Anticipated Outcomes Item Anticipated Outcome  Self Feeding Independent  Swallowing      Basic self-care  Mod I  Toileting  Mod I   Bathroom Transfers Mod I  Bowel/Bladder  n/a  Transfers  mod I  Locomotion  mod I  Communication  supervision  Cognition  supervision  Pain  manage pain < 4 with prns  Safety/Judgment  manage safety w cues   Therapy Plan: PT Intensity: Minimum of 1-2 x/day ,45 to 90 minutes PT Frequency: 5 out of 7 days PT Duration Estimated Length of Stay: 5-7 days OT Intensity: Minimum of 1-2 x/day, 45 to 90 minutes OT Frequency: 5 out of 7 days OT Duration/Estimated Length of Stay: 7-8 days SLP Intensity: Minumum of 1-2 x/day, 30 to 90 minutes SLP Frequency: 3 to 5 out of 7 days SLP Duration/Estimated Length of Stay: 2 weeks   Team Interventions: Nursing Interventions Patient/Family Education, Disease Management/Prevention, Pain Management, Discharge Planning, Medication Management  PT interventions Ambulation/gait training, Cognitive remediation/compensation, Discharge planning, DME/adaptive equipment instruction, Functional  mobility training, Pain management, Psychosocial support, Splinting/orthotics, Therapeutic Activities, Visual/perceptual remediation/compensation, UE/LE Strength taining/ROM, UE/LE Coordination activities, Therapeutic Exercise, Stair training, Skin care/wound management, Patient/family education, Neuromuscular re-education, Functional electrical stimulation, Disease management/prevention, Firefighter, Warden/ranger  OT Interventions Warden/ranger, Firefighter, Chief of staff, Equities Trader education, Self Care/advanced ADL retraining, Therapeutic Exercise, Splinting/orthotics, UE/LE Coordination activities, Discharge planning, Cognitive remediation/compensation, DME/adaptive equipment instruction, Pain management, Therapeutic Activities, UE/LE Strength taining/ROM, Visual/perceptual remediation/compensation  SLP Interventions Cognitive remediation/compensation, Cueing hierarchy, Functional tasks, Therapeutic Activities, Internal/external aids, Patient/family education  TR Interventions    SW/CM Interventions Discharge Planning, Psychosocial Support, Patient/Family Education, Disease Management/Prevention   Barriers to Discharge MD  Medical stability  Nursing Home environment access/layout, Decreased caregiver support 1 level 4 ste left rail w spouse; working FT PTA  PT Home environment best boy, Lack of/limited family support, Community Education Officer for SNF coverage    OT      SLP      SW       Team Discharge Planning: Destination: PT-Home ,OT- Home , SLP-Home Projected Follow-up: PT-Outpatient PT, OT-  Outpatient OT, SLP-Outpatient SLP Projected Equipment Needs: PT-To be determined, OT- To be determined, SLP-None recommended by SLP Equipment Details: PT- , OT-  Patient/family involved in discharge planning: PT- Family member/caregiver,  OT-Patient, Family member/caregiver, SLP-Patient, Family member/caregiver  MD ELOS: 10-12  days Medical Rehab Prognosis:  Excellent Assessment: The patient has been admitted for CIR therapies with the diagnosis of left parietal ICH. The team will be addressing functional mobility, strength, stamina, balance, safety, adaptive techniques and equipment, self-care, bowel and bladder mgt, patient and caregiver education. Goals  have been set at supervision. Anticipated discharge destination is home.        See Team Conference Notes for weekly updates to the plan of care

## 2024-02-26 NOTE — Progress Notes (Signed)
 " Inpatient Rehabilitation Care Coordinator Assessment and Plan Patient Details  Name: Hailey [name] MRN: [id number] Date of Birth: 01/10/1966  Today's Date: 02/26/2024  Hospital Problems: Principal Problem:   ICH (intracerebral hemorrhage) (HCC)  Past Medical History:  Past Medical History:  Diagnosis Date   Angina at rest    Anxiety    Chest pain    Diabetes mellitus without complication (HCC)    High cholesterol    High cholesterol    History of angina    History of TIA (transient ischemic attack) 10/11/2020   Hypercholesterolemia 10/11/2020   Hyperlipidemia    Migraine    Personal history of COVID-19 10/11/2020   Radiculopathy of cervical region    TIA (transient ischemic attack)    Transient cerebral ischemic attack    Type II diabetes mellitus (HCC) 10/11/2020   Past Surgical History:  Past Surgical History:  Procedure Laterality Date   BUNIONECTOMY     CHOLECYSTECTOMY     Social History:  reports that she has never smoked. She has never used smokeless tobacco. She reports that she does not drink alcohol and does not use drugs.  Family / Support Systems Marital Status: Married Patient Roles: Spouse Spouse/Significant Other: Geologist, Engineering Children: Daughter Other Supports: Family friend Anticipated Caregiver: Jeffery Ability/Limitations of Caregiver: Works during the day, will take time off or arrange caregivers Caregiver Availability: 24/7 Family Dynamics: Great family support  Social History Preferred language: English Religion:  Education: High school Health Literacy - How often do you need to have someone help you when you read instructions, pamphlets, or other written material from your doctor or pharmacy?: Never Writes: Yes Employment Status: Employed Name of Employer: [employer] Return to Work Plans: Would like to return to work when Community Education Officer Issues: N/a Guardian/Conservator: N/a   Abuse/Neglect Abuse/Neglect Assessment Can  Be Completed: Yes Physical Abuse: Denies Verbal Abuse: Denies Sexual Abuse: Denies Exploitation of patient/patient's resources: Denies Self-Neglect: Denies  Patient response to: Social Isolation - How often do you feel lonely or isolated from those around you?: Never  Emotional Status Pt's affect, behavior and adjustment status: Adjusting very well to therapy Recent Psychosocial Issues: None Psychiatric History: None Substance Abuse History: None  Patient / Family Perceptions, Expectations & Goals Pt/Family understanding of illness & functional limitations: Patient/Family understanding of illness & functional limitations Premorbid pt/family roles/activities: Active in the community, independently living Anticipated changes in roles/activities/participation: None 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, Trade Union Agencies: None Premorbid Home Care/DME Agencies: None Transportation available at discharge: Yes, Jefferey Is the patient able to respond to transportation needs?: Yes In the past 12 months, has lack of transportation kept you from medical appointments or from getting medications?: No In the past 12 months, has lack of transportation kept you from meetings, work, or from getting things needed for daily living?: No  Discharge Planning Living Arrangements: Spouse/significant other Support Systems: Spouse/significant other, Children, Other relatives Type of Residence: Private residence Insurance Resources: Media Planner (specify) (AETNA / AETNA NAP) Financial Resources: Employment Surveyor, Quantity Screen Referred: Yes Living Expenses: Banker Management: Patient Does the patient have any problems obtaining your medications?: No Home Management: Patient manages own home Patient/Family Preliminary Plans: Plans to return home with spouse and support from family and friends as needed Care Coordinator Anticipated Follow Up Needs:  HH/OP DC Planning Additional Notes/Comments: Has disability with [employer] Expected length of stay: 10-12 days  Clinical Impression CSW met with patient/family to introduce herself and complete initial assessment. Patient presented to  [facility] following ICH. Patient is able to make needs known. Her spouse, [name], daughter and family friend are present in the room during the assessment. Bill is currently employed with [employer] and does plan to return back to work when appropriate. Understands that she will not be able to drive for at least 6 months. She has been working with [employer] to establish short-term disability. Will assist as needed.   There were no further needs or concerns at present. CSW will follow up with family and continue to follow. Will provide patient/family with an update as soon as one becomes available.   Di'Asia  Loreli 02/26/2024, 1:37 PM    "

## 2024-02-26 NOTE — Progress Notes (Signed)
 Patient ID: Hailey [name], female   DOB: February 13, 1965, 59 y.o.   MRN: [id number]  Statement of service delivered.

## 2024-02-26 NOTE — Plan of Care (Signed)
" °  Problem: Consults Goal: RH BRAIN INJURY PATIENT EDUCATION Description: Description: See Patient Education module for eduction specifics Outcome: Progressing Goal: Nutrition Consult-if indicated Outcome: Progressing Goal: Diabetes Guidelines if Diabetic/Glucose > 140 Description: If diabetic or lab glucose is > 140 mg/dl - Initiate Diabetes/Hyperglycemia Guidelines & Document Interventions  Outcome: Progressing   Problem: RH SAFETY Goal: RH STG ADHERE TO SAFETY PRECAUTIONS W/ASSISTANCE/DEVICE Description: STG Adhere to Safety Precautions With cues Assistance/Device. Outcome: Progressing   Problem: RH PAIN MANAGEMENT Goal: RH STG PAIN MANAGED AT OR BELOW PT'S PAIN GOAL Description: Pain < 4 with prns Outcome: Progressing   Problem: RH KNOWLEDGE DEFICIT BRAIN INJURY Goal: RH STG INCREASE KNOWLEDGE OF SELF CARE AFTER BRAIN INJURY Description: Patient and spouse will be able to manage care using medications and dietary modification independently Outcome: Progressing   Problem: RH KNOWLEDGE DEFICIT Goal: RH STG INCREASE KNOWLEDGE OF DIABETES Description: Patient and spouse will be able to manage DM using medications and dietary modification independently Outcome: Progressing Goal: RH STG INCREASE KNOWLEDGE OF HYPERTENSION Description: Patient and spouse will be able to manage HTN using medications and dietary modification independently Outcome: Progressing Goal: RH STG INCREASE KNOWLEGDE OF HYPERLIPIDEMIA Description: Patient and spouse will be able to manage HLD using medications and dietary modification independently Outcome: Progressing   "

## 2024-02-27 DIAGNOSIS — I959 Hypotension, unspecified: Secondary | ICD-10-CM | POA: Diagnosis not present

## 2024-02-27 DIAGNOSIS — R519 Headache, unspecified: Secondary | ICD-10-CM

## 2024-02-27 DIAGNOSIS — R7401 Elevation of levels of liver transaminase levels: Secondary | ICD-10-CM | POA: Diagnosis not present

## 2024-02-27 DIAGNOSIS — D72829 Elevated white blood cell count, unspecified: Secondary | ICD-10-CM | POA: Diagnosis not present

## 2024-02-27 DIAGNOSIS — E1169 Type 2 diabetes mellitus with other specified complication: Secondary | ICD-10-CM | POA: Diagnosis not present

## 2024-02-27 DIAGNOSIS — Z794 Long term (current) use of insulin: Secondary | ICD-10-CM | POA: Diagnosis not present

## 2024-02-27 DIAGNOSIS — R7989 Other specified abnormal findings of blood chemistry: Secondary | ICD-10-CM

## 2024-02-27 DIAGNOSIS — I611 Nontraumatic intracerebral hemorrhage in hemisphere, cortical: Secondary | ICD-10-CM | POA: Diagnosis not present

## 2024-02-27 LAB — CBC WITH DIFFERENTIAL/PLATELET
Abs Immature Granulocytes: 0.08 10*3/uL — ABNORMAL HIGH (ref 0.00–0.07)
Basophils Absolute: 0.1 10*3/uL (ref 0.0–0.1)
Basophils Relative: 1 %
Eosinophils Absolute: 0.3 10*3/uL (ref 0.0–0.5)
Eosinophils Relative: 3 %
HCT: 43.8 % (ref 36.0–46.0)
Hemoglobin: 14.6 g/dL (ref 12.0–15.0)
Immature Granulocytes: 1 %
Lymphocytes Relative: 39 %
Lymphs Abs: 3.8 10*3/uL (ref 0.7–4.0)
MCH: 28.1 pg (ref 26.0–34.0)
MCHC: 33.3 g/dL (ref 30.0–36.0)
MCV: 84.2 fL (ref 80.0–100.0)
Monocytes Absolute: 0.4 10*3/uL (ref 0.1–1.0)
Monocytes Relative: 4 %
Neutro Abs: 5 10*3/uL (ref 1.7–7.7)
Neutrophils Relative %: 52 %
Platelets: 324 10*3/uL (ref 150–400)
RBC: 5.2 MIL/uL — ABNORMAL HIGH (ref 3.87–5.11)
RDW: 14.6 % (ref 11.5–15.5)
WBC: 9.6 10*3/uL (ref 4.0–10.5)
nRBC: 0 % (ref 0.0–0.2)

## 2024-02-27 LAB — VITAMIN D 25 HYDROXY (VIT D DEFICIENCY, FRACTURES): Vit D, 25-Hydroxy: 14.2 ng/mL — ABNORMAL LOW (ref 30–100)

## 2024-02-27 LAB — COMPREHENSIVE METABOLIC PANEL WITH GFR
ALT: 147 U/L — ABNORMAL HIGH (ref 0–44)
AST: 53 U/L — ABNORMAL HIGH (ref 15–41)
Albumin: 4.2 g/dL (ref 3.5–5.0)
Alkaline Phosphatase: 99 U/L (ref 38–126)
Anion gap: 12 (ref 5–15)
BUN: 18 mg/dL (ref 6–20)
CO2: 22 mmol/L (ref 22–32)
Calcium: 9.7 mg/dL (ref 8.9–10.3)
Chloride: 103 mmol/L (ref 98–111)
Creatinine, Ser: 0.71 mg/dL (ref 0.44–1.00)
GFR, Estimated: >60 mL/min
Glucose, Bld: 109 mg/dL — ABNORMAL HIGH (ref 70–99)
Potassium: 4 mmol/L (ref 3.5–5.1)
Sodium: 137 mmol/L (ref 135–145)
Total Bilirubin: 0.3 mg/dL (ref 0.0–1.2)
Total Protein: 7.3 g/dL (ref 6.5–8.1)

## 2024-02-27 LAB — GLUCOSE, CAPILLARY
Glucose-Capillary: 111 mg/dL — ABNORMAL HIGH (ref 70–99)
Glucose-Capillary: 110 mg/dL — ABNORMAL HIGH (ref 70–99)
Glucose-Capillary: 115 mg/dL — ABNORMAL HIGH (ref 70–99)
Glucose-Capillary: 154 mg/dL — ABNORMAL HIGH (ref 70–99)

## 2024-02-27 LAB — MAGNESIUM: Magnesium: 2 mg/dL (ref 1.7–2.4)

## 2024-02-27 MED ORDER — VITAMIN D 25 MCG (1000 UNIT) PO TABS
1000.0000 [IU] | ORAL_TABLET | Freq: Every day | ORAL | Status: DC
  Administered 2024-02-27 – 2024-03-03 (×6): 1000 [IU] via ORAL
  Filled 2024-02-27 (×7): qty 1

## 2024-02-27 NOTE — Progress Notes (Signed)
 "                                                        PROGRESS NOTE   Subjective/Complaints: Reports occasional mild headache.  She does not feel like it is severe enough that she wants medication for this.  Reports bowel movement today.  No additional concerns.  ROS: +intermittent headaches Denies chest pain, shortness of breath, abdominal pain   Objective:   No results found. Recent Labs    02/26/24 0637 02/27/24 0718  WBC 10.6* 9.6  HGB 13.5 14.6  HCT 40.4 43.8  PLT 292 324   Recent Labs    02/26/24 0637 02/27/24 0718  NA 136 137  K 3.9 4.0  CL 103 103  CO2 23 22  GLUCOSE 113* 109*  BUN 17 18  CREATININE 0.67 0.71  CALCIUM  9.3 9.7    Intake/Output Summary (Last 24 hours) at 02/27/2024 1607 Last data filed at 02/27/2024 1358 Gross per 24 hour  Intake 637 ml  Output --  Net 637 ml        Physical Exam: Vital Signs Blood pressure 128/67, pulse 87, temperature 97.9 F (36.6 C), resp. rate 18, height 5' 1 (1.549 m), weight 96.4 kg, SpO2 98%. Gen: no distress, normal appearing HEENT: oral mucosa pink and moist, NCAT Cardio: Reg rate Chest: CTAB, normal effort, normal rate of breathing Abd: soft, non-distended, positive bowel sounds Ext: no edema Psych: pleasant, normal affect Skin: intact Neuro: Alert and oriented x3 Musculoskeletal: RYE 3/5, RLE 4/5, LUE and LLE 5/5. Sensation is decreased in right side.    Assessment/Plan: 1. Functional deficits which require 3+ hours per day of interdisciplinary therapy in a comprehensive inpatient rehab setting. Physiatrist is providing close team supervision and 24 hour management of active medical problems listed below. Physiatrist and rehab team continue to assess barriers to discharge/monitor patient progress toward functional and medical goals  Care Tool:  Bathing    Body parts bathed by patient: Right arm, Chest, Abdomen, Front perineal area, Buttocks, Right upper leg, Left upper leg, Left lower leg,  Face, Right lower leg, Left arm         Bathing assist Assist Level: Contact Guard/Touching assist     Upper Body Dressing/Undressing Upper body dressing   What is the patient wearing?: Pull over shirt, Bra    Upper body assist Assist Level: Minimal Assistance - Patient > 75%    Lower Body Dressing/Undressing Lower body dressing      What is the patient wearing?: Underwear/pull up, Pants     Lower body assist Assist for lower body dressing: Supervision/Verbal cueing     Toileting Toileting    Toileting assist Assist for toileting: Minimal Assistance - Patient > 75%     Transfers Chair/bed transfer  Transfers assist     Chair/bed transfer assist level: Contact Guard/Touching assist     Locomotion Ambulation   Ambulation assist      Assist level: Contact Guard/Touching assist Assistive device: No Device Max distance: 200'   Walk 10 feet activity   Assist     Assist level: Contact Guard/Touching assist Assistive device: No Device   Walk 50 feet activity   Assist    Assist level: Contact Guard/Touching assist Assistive device: No Device    Walk 150 feet activity  Assist    Assist level: Contact Guard/Touching assist Assistive device: No Device    Walk 10 feet on uneven surface  activity   Assist     Assist level: Contact Guard/Touching assist     Wheelchair     Assist Is the patient using a wheelchair?: No             Wheelchair 50 feet with 2 turns activity    Assist            Wheelchair 150 feet activity     Assist          Blood pressure 128/67, pulse 87, temperature 97.9 F (36.6 C), resp. rate 18, height 5' 1 (1.549 m), weight 96.4 kg, SpO2 98%.  Medical Problem List and Plan: 1. Functional deficits secondary to left parietal ICH             -patient may  shower             -ELOS/Goals: 10-12 days, supervision goals with  PT, OT, SLP  This patient is capable of making decisions on her  own behalf   2.  Antithrombotics: -DVT/anticoagulation:  Mechanical:  Antiembolism stockings, knee (TED hose) Bilateral lower extremities Pharmaceutical: Eliquis              -antiplatelet therapy: n/a  3. Pain Management/headaches:   -pt has remote hx of migraines. Says these headaches are similar in quality and intensity  -begin trial of topamax  -continue Fioricet  but limit d/t potential rebound h/a - Tylenol  as needed. -recommended requesting ice packs for headaches  4. Transaminitis: repeat CMP ordered for tomorrow  5. 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   6.  Seizure: LTM EEG no seizure, continue Keppra  1000 mg twice daily.  7.  Hypotension: continue to monitor BP TID  - 1/24 was lower in the morning improved in the afternoon.  Continue to monitor trend    02/27/2024    2:23 PM 02/27/2024    5:29 AM 02/26/2024    7:37 PM  Vitals with BMI  Systolic 128 95 92  Diastolic 67 46 53  Pulse 87 80 86     8. HLD: No meds PTA. LDL 140 goal <70. Hold statin given elevated LFTs  12. DM2: HgbA1c 6.2, goal < 7.0 Monitor glucose ac/hs with SSI Novolog .   CBG (last 3)  Recent Labs    02/26/24 2051 02/27/24 0530 02/27/24 1105  GLUCAP 111* 111* 110*   -1/24 Controlled continue current regimen and monitor   13. Class 3 obesity:  BMI 41.61 kg/m educate on diet and weight loss to promote overall health and mobility. Check magnesium  and vitamin D  levels tomorrow  14. Leukocytosis: repeat CBC tomorrow to trend  -1/24 WBC within normal limits today   15. Low vitamin D  level  -start daily replacement  16.  Transaminitis   -Does not appear to be getting frequent Tylenol , continue to monitor trend  LOS: 2 days A FACE TO FACE EVALUATION WAS PERFORMED  Murray Collier 02/27/2024, 4:07 PM     "

## 2024-02-27 NOTE — Progress Notes (Signed)
 Physical Therapy Session Note  Patient Details  Name: Hailey [name] MRN: [id number] Date of Birth: 06/23/1965  Today's Date: 02/27/2024 PT Individual Time: 1100-1156 PT Individual Time Calculation (min): 56 min   Short Term Goals: Week 1:  PT Short Term Goal 1 (Week 1): STG = LTG due to ELOS  Skilled Therapeutic Interventions/Progress Updates:      Pt presents sitting in recliner with her husband present for duration of session. Pt denies any pain but reports having a rough day. She reports that she's having difficulty with short term memory, remembering passwords, and feeling overall foggy. Spent time educating her on CVA recovery in the nature of an ICH CVA.   Sit<>stand from recliner with no AD at supervision level. She ambulates with supervision and no AD or UE support from her room to the ortho gym, ~150'. Cues for wide turns to avoid door frame on her R side.   Pt completed various activities with the BITS system, in sitting to reduce dual-cog load.   Maze Test: *challenging attention, visuoconstructional ability and executive functions of planning and foresight. 3 minutes and 7 seconds, x13 errors Trail Making #2 (1/A, 2/B, 3/C, etc): *challenging visual search speed, scanning, processing speed, mental flexbility, and executive functioning. 11 minutes and 45 seconds, x311 errors, x87 interrupts. Max cues needed for completion. Letter Chart Puzzle: *challenging visual and mental demand while alternating reading stimuli from side to side. 7 minutes and 20 seconds, 49 second response time. Memory up to 10 sequence with 10 sec flash time: Pt able to achieve up to 7 words before errors were made. Geoboard Duplication without memory component. *Challenging abstract cognition and executive functioning.   Pt returned to her room and ended treatment sitting in recliner, husband at bedside. All needs met.   Therapy Documentation Precautions:  Precautions Precautions:  Fall Restrictions Weight Bearing Restrictions Per Provider Order: No General:    Therapy/Group: Individual Therapy  [name] 02/27/2024, 6:51 AM

## 2024-02-27 NOTE — Progress Notes (Signed)
 Speech Language Pathology Daily Session Note  Patient Details  Name: Hailey [name] MRN: [id number] Date of Birth: 13-Mar-1965  Today's Date: 02/27/2024 SLP Individual Time: 1300-1405 SLP Individual Time Calculation (min): 65 min  Short Term Goals: Week 1: SLP Short Term Goal 1 (Week 1): Patient will read mildly complex sentences with increased processing speed with min assist. SLP Short Term Goal 2 (Week 1): Patient will solve basic visually presented problems with 80% accuracy given min assist. SLP Short Term Goal 3 (Week 1): Patient will write answers to basic problems with min assist for spelling accuracy and language fluency. SLP Short Term Goal 4 (Week 1): Patient will sustain attention to functional tasks for 20 minutes with min assist. SLP Short Term Goal 5 (Week 1): Patient will recall events from daily interactions with 80% accuracy given min assist.  Skilled Therapeutic Interventions:  Pt seen for ST targeting cognitive re-training goals. Denied pain. Pt demonstrated great awareness of cognitive changes since her stroke. Pt reports difficulty recalling tablet password and was agreeable to using this task with spaced retrieval technique. She recalled her pw up to 10 minutes with 100% acc IND and 80% acc up to 25 minutes when provided min A. Encouraged her to set timers on her phone to review this information using SRT as the day progresses. Pt had 100% acc IND for reading phrases and matching them from a fo4 to pictures. She had 100% acc IND for sentence completion reading tasks. SLP then challenged pt in problem solving/working memory task with pt writing keywords to improve recall when read 2-3 sentences. Occasional semantic paraphasias in writing and letter reversals noted. She benefited from a visual representation of information to increase problem solving but was able to do so with 100% acc when provided visual from SLP. Using CART, she was able to spell her son's names correctly when  provided min-mod A. She benefits from segmenting tasks and covering items to reduce overwhelm of visual stimuli and improve attention.   Physical Therapy reports pt enjoys Mahjong game. Unable to utilize this activity in today's session but encouraged pt to continue trying it in smaller chunks of time to build up her tolerance as she reports it is difficult to attend to it currently. Pt also reports difficulty adjusting to cognitive changes and loss of independent post-CVA. This is also a difficult time of year for her due to death anniversaries of loved ones. SLP provided active listening, validation, and encouragement. Discussed availability of counseling services during CIR stay and pt is interested. SLP informed NSG. Pt left in chair with call bell in reach and husband present. Continue ST POC.  Pain Pain Assessment Pain Scale: 0-10 Pain Score: 0-No pain Faces Pain Scale: No hurt  Therapy/Group: Individual Therapy  Waddell JONETTA Novak, MA CCC-SLP 02/27/2024, 3:26 PM

## 2024-02-27 NOTE — Progress Notes (Signed)
 Occupational Therapy Session Note  Patient Details  Name: Hailey [name] MRN: [id number] Date of Birth: [birth year]  Today's Date: 02/27/2024 OT Individual Time: 9154-9044 OT Individual Time Calculation (min): 70 min    Short Term Goals: Week 1:  OT Short Term Goal 1 (Week 1): STG=LTG due to LOS   Skilled Therapeutic Interventions/Progress Updates:    Pt sitting EOB at time of session, no pain. Discussion with pt regarding stoke visual changes and providing education as able. Focus of session on ADL retraining with pt ambulating to/from bathroom, shower level bathing - standing throughout entire shower (note OT retrieved shower seat but did not use), drying off, ambulating back to bed level, UB/LB dressing tasks with MIN A for bra clasp and Supervision for all LB dressing for underwear, pants, socks, and shoes. Ambulated throughout room and bathroom with no AD with CGA all transfers. Standing at sink level for face washing and oral care. Setup in recliner, focused briefly on RUE FMC/GMC tasks for writing familiar names and provided with high resistance sponge for grip and digit strengthening. Set up with spouse in room, all needs met call bell in reach.   Therapy Documentation Precautions:  Precautions Precautions: Fall Restrictions Weight Bearing Restrictions Per Provider Order: No    Therapy/Group: Individual Therapy  Hailey [name] 02/27/2024, 7:22 AM

## 2024-02-28 DIAGNOSIS — I611 Nontraumatic intracerebral hemorrhage in hemisphere, cortical: Secondary | ICD-10-CM | POA: Diagnosis not present

## 2024-02-28 DIAGNOSIS — R7989 Other specified abnormal findings of blood chemistry: Secondary | ICD-10-CM | POA: Diagnosis not present

## 2024-02-28 DIAGNOSIS — R519 Headache, unspecified: Secondary | ICD-10-CM | POA: Diagnosis not present

## 2024-02-28 DIAGNOSIS — R7401 Elevation of levels of liver transaminase levels: Secondary | ICD-10-CM

## 2024-02-28 DIAGNOSIS — I959 Hypotension, unspecified: Secondary | ICD-10-CM | POA: Diagnosis not present

## 2024-02-28 LAB — GLUCOSE, CAPILLARY
Glucose-Capillary: 115 mg/dL — ABNORMAL HIGH (ref 70–99)
Glucose-Capillary: 109 mg/dL — ABNORMAL HIGH (ref 70–99)
Glucose-Capillary: 155 mg/dL — ABNORMAL HIGH (ref 70–99)
Glucose-Capillary: 118 mg/dL — ABNORMAL HIGH (ref 70–99)

## 2024-02-28 NOTE — Progress Notes (Signed)
..  SPIRITUAL CARE AND COUNSELING CONSULT NOTE   VISIT SUMMARY Chaplain offered consult to patient and patient refused AD, chaplain remains available upon request    SPIRITUAL ENCOUNTER                                                                                                                                                                      Type of Visit: Initial Care provided to:: Patient Referral source: Family, Patient request Reason for visit: Grief/loss OnCall Visit: Yes   SPIRITUAL FRAMEWORK      GOALS       INTERVENTIONS        INTERVENTION OUTCOMES      SPIRITUAL CARE PLAN        If immediate needs arise, please contact [facility] 24 hour on call [phone]   Jerona James  02/28/2024 8:45 PM

## 2024-02-28 NOTE — Progress Notes (Signed)
 "                                                        PROGRESS NOTE   Subjective/Complaints: Headache overall controlled with medications. Reports BM today  ROS: +intermittent headaches Denies chest pain, shortness of breath, abdominal pain, constipation   Objective:   No results found. Recent Labs    02/26/24 0637 02/27/24 0718  WBC 10.6* 9.6  HGB 13.5 14.6  HCT 40.4 43.8  PLT 292 324   Recent Labs    02/26/24 0637 02/27/24 0718  NA 136 137  K 3.9 4.0  CL 103 103  CO2 23 22  GLUCOSE 113* 109*  BUN 17 18  CREATININE 0.67 0.71  CALCIUM  9.3 9.7    Intake/Output Summary (Last 24 hours) at 02/28/2024 1051 Last data filed at 02/27/2024 1358 Gross per 24 hour  Intake 237 ml  Output --  Net 237 ml        Physical Exam: Vital Signs Blood pressure (!) 108/57, pulse 78, temperature 98.5 F (36.9 C), temperature source Oral, resp. rate 16, height 5' 1 (1.549 m), weight 96.4 kg, SpO2 96%. Gen: no distress, normal appearing, laying in bed-dark room HEENT: oral mucosa pink and moist, NCAT Cardio: RRR Chest: CTAB, normal effort, normal rate of breathing Abd: soft, non-distended, positive bowel sounds Ext: no edema Psych: pleasant, normal affect Skin: intact Neuro: Alert and oriented x3 Musculoskeletal: RYE 3/5, RLE 4/5, LUE and LLE 5/5. Sensation is decreased in right side.    Assessment/Plan: 1. Functional deficits which require 3+ hours per day of interdisciplinary therapy in a comprehensive inpatient rehab setting. Physiatrist is providing close team supervision and 24 hour management of active medical problems listed below. Physiatrist and rehab team continue to assess barriers to discharge/monitor patient progress toward functional and medical goals  Care Tool:  Bathing    Body parts bathed by patient: Right arm, Chest, Abdomen, Front perineal area, Buttocks, Right upper leg, Left upper leg, Left lower leg, Face, Right lower leg, Left arm          Bathing assist Assist Level: Contact Guard/Touching assist     Upper Body Dressing/Undressing Upper body dressing   What is the patient wearing?: Pull over shirt, Bra    Upper body assist Assist Level: Minimal Assistance - Patient > 75%    Lower Body Dressing/Undressing Lower body dressing      What is the patient wearing?: Underwear/pull up, Pants     Lower body assist Assist for lower body dressing: Supervision/Verbal cueing     Toileting Toileting    Toileting assist Assist for toileting: Minimal Assistance - Patient > 75%     Transfers Chair/bed transfer  Transfers assist     Chair/bed transfer assist level: Contact Guard/Touching assist     Locomotion Ambulation   Ambulation assist      Assist level: Contact Guard/Touching assist Assistive device: No Device Max distance: 200'   Walk 10 feet activity   Assist     Assist level: Contact Guard/Touching assist Assistive device: No Device   Walk 50 feet activity   Assist    Assist level: Contact Guard/Touching assist Assistive device: No Device    Walk 150 feet activity   Assist    Assist level: Contact Guard/Touching assist Assistive device: No Device  Walk 10 feet on uneven surface  activity   Assist     Assist level: Contact Guard/Touching assist     Wheelchair     Assist Is the patient using a wheelchair?: No             Wheelchair 50 feet with 2 turns activity    Assist            Wheelchair 150 feet activity     Assist          Blood pressure (!) 108/57, pulse 78, temperature 98.5 F (36.9 C), temperature source Oral, resp. rate 16, height 5' 1 (1.549 m), weight 96.4 kg, SpO2 96%.  Medical Problem List and Plan: 1. Functional deficits secondary to left parietal ICH             -patient may  shower             -ELOS/Goals: 10-12 days, supervision goals with  PT, OT, SLP  This patient is capable of making decisions on her own behalf    2.  Antithrombotics: -DVT/anticoagulation:  Mechanical:  Antiembolism stockings, knee (TED hose) Bilateral lower extremities Pharmaceutical: Eliquis              -antiplatelet therapy: n/a  3. Pain Management/headaches:   -pt has remote hx of migraines. Says these headaches are similar in quality and intensity  -begin trial of topamax  -continue Fioricet  but limit d/t potential rebound h/a - Tylenol  as needed. -recommended requesting ice packs for headaches  4. Transaminitis: repeat CMP ordered for tomorrow  5. 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   6.  Seizure: LTM EEG no seizure, continue Keppra  1000 mg twice daily.  7.  Hypotension: continue to monitor BP TID  - 1/25 BP intermittently a little soft, continue to monitor trend.  Monitor for symptoms    02/28/2024    4:13 AM 02/27/2024    7:00 PM 02/27/2024    2:23 PM  Vitals with BMI  Systolic 108 108 871  Diastolic 57 55 67  Pulse 78 90 87     8. HLD: No meds PTA. LDL 140 goal <70. Hold statin given elevated LFTs  12. DM2: HgbA1c 6.2, goal < 7.0 Monitor glucose ac/hs with SSI Novolog .   CBG (last 3)  Recent Labs    02/27/24 1630 02/27/24 2033 02/28/24 0537  GLUCAP 115* 154* 115*   -1/24-25 Controlled continue current regimen and monitor   13. Class 3 obesity:  BMI 41.61 kg/m educate on diet and weight loss to promote overall health and mobility. Check magnesium  and vitamin D  levels tomorrow  14. Leukocytosis: repeat CBC tomorrow to trend  -1/24 WBC within normal limits today   15. Low vitamin D  level  -start daily replacement, discussed level with patient.  She reports she has taken vitamin D  supplementation in the past.  16.  Transaminitis   -Does not appear to be getting frequent Tylenol , recheck tomorrow  LOS: 3 days A FACE TO FACE EVALUATION WAS PERFORMED  Murray Collier 02/28/2024, 10:51 AM     "

## 2024-02-28 NOTE — Plan of Care (Signed)
" °  Problem: Consults Goal: RH BRAIN INJURY PATIENT EDUCATION Description: Description: See Patient Education module for eduction specifics Outcome: Progressing Goal: Nutrition Consult-if indicated Outcome: Progressing Goal: Diabetes Guidelines if Diabetic/Glucose > 140 Description: If diabetic or lab glucose is > 140 mg/dl - Initiate Diabetes/Hyperglycemia Guidelines & Document Interventions  Outcome: Progressing   Problem: RH SAFETY Goal: RH STG ADHERE TO SAFETY PRECAUTIONS W/ASSISTANCE/DEVICE Description: STG Adhere to Safety Precautions With cues Assistance/Device. Outcome: Progressing   Problem: RH PAIN MANAGEMENT Goal: RH STG PAIN MANAGED AT OR BELOW PT'S PAIN GOAL Description: Pain < 4 with prns Outcome: Progressing   Problem: RH KNOWLEDGE DEFICIT BRAIN INJURY Goal: RH STG INCREASE KNOWLEDGE OF SELF CARE AFTER BRAIN INJURY Description: Patient and spouse will be able to manage care using medications and dietary modification independently Outcome: Progressing   Problem: RH KNOWLEDGE DEFICIT Goal: RH STG INCREASE KNOWLEDGE OF DIABETES Description: Patient and spouse will be able to manage DM using medications and dietary modification independently Outcome: Progressing Goal: RH STG INCREASE KNOWLEDGE OF HYPERTENSION Description: Patient and spouse will be able to manage HTN using medications and dietary modification independently Outcome: Progressing Goal: RH STG INCREASE KNOWLEGDE OF HYPERLIPIDEMIA Description: Patient and spouse will be able to manage HLD using medications and dietary modification independently Outcome: Progressing   Problem: RH Vision Goal: RH LTG Vision (Specify) Outcome: Progressing   "

## 2024-02-29 DIAGNOSIS — I611 Nontraumatic intracerebral hemorrhage in hemisphere, cortical: Secondary | ICD-10-CM | POA: Diagnosis not present

## 2024-02-29 LAB — BASIC METABOLIC PANEL WITH GFR
Anion gap: 13 (ref 5–15)
BUN: 19 mg/dL (ref 6–20)
CO2: 20 mmol/L — ABNORMAL LOW (ref 22–32)
Calcium: 9.4 mg/dL (ref 8.9–10.3)
Chloride: 105 mmol/L (ref 98–111)
Creatinine, Ser: 0.73 mg/dL (ref 0.44–1.00)
GFR, Estimated: >60 mL/min
Glucose, Bld: 110 mg/dL — ABNORMAL HIGH (ref 70–99)
Potassium: 4 mmol/L (ref 3.5–5.1)
Sodium: 138 mmol/L (ref 135–145)

## 2024-02-29 LAB — HEPATIC FUNCTION PANEL
ALT: 73 U/L — ABNORMAL HIGH (ref 0–44)
AST: 19 U/L (ref 15–41)
Albumin: 3.9 g/dL (ref 3.5–5.0)
Alkaline Phosphatase: 86 U/L (ref 38–126)
Bilirubin, Direct: <0.1 mg/dL (ref 0.0–0.2)
Total Bilirubin: 0.3 mg/dL (ref 0.0–1.2)
Total Protein: 6.8 g/dL (ref 6.5–8.1)

## 2024-02-29 LAB — CBC
HCT: 42.2 % (ref 36.0–46.0)
Hemoglobin: 14.1 g/dL (ref 12.0–15.0)
MCH: 28.1 pg (ref 26.0–34.0)
MCHC: 33.4 g/dL (ref 30.0–36.0)
MCV: 84.1 fL (ref 80.0–100.0)
Platelets: 260 10*3/uL (ref 150–400)
RBC: 5.02 MIL/uL (ref 3.87–5.11)
RDW: 14.6 % (ref 11.5–15.5)
WBC: 9.2 10*3/uL (ref 4.0–10.5)
nRBC: 0 % (ref 0.0–0.2)

## 2024-02-29 LAB — GLUCOSE, CAPILLARY
Glucose-Capillary: 117 mg/dL — ABNORMAL HIGH (ref 70–99)
Glucose-Capillary: 100 mg/dL — ABNORMAL HIGH (ref 70–99)
Glucose-Capillary: 138 mg/dL — ABNORMAL HIGH (ref 70–99)
Glucose-Capillary: 135 mg/dL — ABNORMAL HIGH (ref 70–99)

## 2024-02-29 NOTE — Progress Notes (Signed)
 Physical Therapy Note  Patient Details  Name: Hailey [name] MRN: [id number] Date of Birth: 1965/03/21 Today's Date: 02/29/2024    Due to the [place]  state of emergency, patients may not receive 3.0 hours of therapy services.    Jenniger Figiel 02/29/2024, 10:55 AM

## 2024-02-29 NOTE — Progress Notes (Signed)
 Occupational Therapy Session Note  Patient Details  Name: Hailey [name] MRN: [id number] Date of Birth: 30-Sep-1965  Today's Date: 02/29/2024 OT Individual Time: 0902-1002 OT Individual Time Calculation (min): 60 min    Short Term Goals: Week 1:  OT Short Term Goal 1 (Week 1): STG=LTG due to LOS  Skilled Therapeutic Interventions/Progress Updates:   Patient received sitting on edge of bed.  Patient expressing frustration/ irritation at not being allowed to walk around in her room.  Discussed that patient is allowed to have husband help her move about her room, but that she has not yet been cleared to move around on her own.   Patient eager to take a shower and instructed to gather her own clothing and needed items.   Patient moving slowly and cautiously - although not colliding with door frame on right side.  Patient functionally using RUE throughout this session with min cueing.  Patient showing perceptual deficits more clearly this session - mild apraxia, and spatial relations.  Patient needed increased time and overt cueing to motor plan donning her bra.  Patient unable to hold conversation while donning shirt.  Discussed motor planning issues with patient and husband.   Patient walked to gym - working on stride length, speed of walking and turning without slowing.  Worked on functional use of RUE in sitting, standing and while walking.   Patient sat up in recliner at end of session with husband at bedside.    Therapy Documentation Precautions:  Precautions Precautions: Fall Restrictions Weight Bearing Restrictions Per Provider Order: No   Pain:  Denies pain   Therapy/Group: Individual Therapy  Keegan Bensch M 02/29/2024, 12:33 PM

## 2024-02-29 NOTE — Progress Notes (Signed)
 Occupational Therapy Session Note  Patient Details  Name: Hailey [name] MRN: [id number] Date of Birth: 1965-07-31  {CHL IP REHAB OT TIME CALCULATIONS:[id number]}   Short Term Goals: Week 1:  OT Short Term Goal 1 (Week 1): STG=LTG due to LOS  Skilled Therapeutic Interventions/Progress Updates:  Pt greeted *** for skilled OT session with focus on ***.   Pain: Pt reported ***/10 pain, stating *** in reference to ***. OT offering intermediate rest breaks and positioning suggestions throughout session to address pain/fatigue and maximize participation/safety in session.   Functional Transfers:  Self Care Tasks: Pt completes the following self care tasks with levels of assistance noted below, UB: LB:   Therapeutic Activities:  Therapeutic Exercise:   Education:  Pt remained *** with 4Ps assessed and immediate needs met. Pt continues to be appropriate for skilled OT intervention to promote further functional independence in ADLs/IADLs.    Therapy Documentation Precautions:  Precautions Precautions: Fall Restrictions Weight Bearing Restrictions Per Provider Order: No   Therapy/Group: Individual Therapy  Nereida Habermann, OTR/L, MSOT  02/29/2024, 9:07 PM

## 2024-02-29 NOTE — Progress Notes (Signed)
 Speech Language Pathology Daily Session Note  Patient Details  Name: Cheryel Kyte MRN: [id number] Date of Birth: November 19, 1965  Today's Date: 02/29/2024 SLP Individual Time: 1400-1456 SLP Individual Time Calculation (min): 56 min  Short Term Goals: Week 1: SLP Short Term Goal 1 (Week 1): Patient will read mildly complex sentences with increased processing speed with min assist. SLP Short Term Goal 2 (Week 1): Patient will solve basic visually presented problems with 80% accuracy given min assist. SLP Short Term Goal 3 (Week 1): Patient will write answers to basic problems with min assist for spelling accuracy and language fluency. SLP Short Term Goal 4 (Week 1): Patient will sustain attention to functional tasks for 20 minutes with min assist. SLP Short Term Goal 5 (Week 1): Patient will recall events from daily interactions with 80% accuracy given min assist.  Skilled Therapeutic Interventions: SLP conducted skilled therapy session targeting cognitive-communication goals. SLP presented patient with basic to mildly complex problems in written modality to challenge sustained attention, problem solving, reading comprehension. Patient benefited from +++processing time, min assist for question breakdown, and mod assist for answer generation and accuracy. SLP then facilitated word categorization task where patient placed written words into pre-designated categories, benefiting from min assist for spelling error identification and remediation. Patient was left in room with call bell in reach and alarm set. SLP will continue to target goals per plan of care.        Pain  None  Therapy/Group: Individual Therapy  Elias Bordner, M.A., CCC-SLP  Sherle Mello A Colbin Jovel 02/29/2024, 2:58 PM

## 2024-02-29 NOTE — Progress Notes (Signed)
 "                                                        PROGRESS NOTE   Subjective/Complaints: No new complaints this morning She continues to have cognitive deficits and headaches. Headaches are worst at night. She has not yet tried cold packs.   ROS: +intermittent headaches, +impaired cognition Denies chest pain, shortness of breath, abdominal pain, constipation   Objective:   No results found. Recent Labs    02/27/24 0718 02/29/24 0420  WBC 9.6 9.2  HGB 14.6 14.1  HCT 43.8 42.2  PLT 324 260   Recent Labs    02/27/24 0718 02/29/24 0420  NA 137 138  K 4.0 4.0  CL 103 105  CO2 22 20*  GLUCOSE 109* 110*  BUN 18 19  CREATININE 0.71 0.73  CALCIUM  9.7 9.4    Intake/Output Summary (Last 24 hours) at 02/29/2024 1032 Last data filed at 02/29/2024 0747 Gross per 24 hour  Intake 480 ml  Output --  Net 480 ml        Physical Exam: Vital Signs Blood pressure 126/62, pulse 73, temperature 97.6 F (36.4 C), resp. rate 18, height 5' 1 (1.549 m), weight 96.4 kg, SpO2 96%. Gen: no distress, normal appearing, laying in bed-dark room HEENT: oral mucosa pink and moist, NCAT Cardio: RRR Chest: CTAB, normal effort, normal rate of breathing Abd: soft, non-distended, positive bowel sounds Ext: no edema Psych: pleasant, normal affect Skin: intact Neuro: Alert and oriented x3 Musculoskeletal: RYE 3/5, RLE 4/5, LUE and LLE 5/5. Sensation is decreased in right side. Higher level cognitive deficits   Assessment/Plan: 1. Functional deficits which require 3+ hours per day of interdisciplinary therapy in a comprehensive inpatient rehab setting. Physiatrist is providing close team supervision and 24 hour management of active medical problems listed below. Physiatrist and rehab team continue to assess barriers to discharge/monitor patient progress toward functional and medical goals  Care Tool:  Bathing    Body parts bathed by patient: Right arm, Chest, Abdomen, Front perineal  area, Buttocks, Right upper leg, Left upper leg, Left lower leg, Face, Right lower leg, Left arm         Bathing assist Assist Level: Contact Guard/Touching assist     Upper Body Dressing/Undressing Upper body dressing   What is the patient wearing?: Pull over shirt, Bra    Upper body assist Assist Level: Minimal Assistance - Patient > 75%    Lower Body Dressing/Undressing Lower body dressing      What is the patient wearing?: Underwear/pull up, Pants     Lower body assist Assist for lower body dressing: Supervision/Verbal cueing     Toileting Toileting    Toileting assist Assist for toileting: Minimal Assistance - Patient > 75%     Transfers Chair/bed transfer  Transfers assist     Chair/bed transfer assist level: Contact Guard/Touching assist     Locomotion Ambulation   Ambulation assist      Assist level: Contact Guard/Touching assist Assistive device: No Device Max distance: 200'   Walk 10 feet activity   Assist     Assist level: Contact Guard/Touching assist Assistive device: No Device   Walk 50 feet activity   Assist    Assist level: Contact Guard/Touching assist Assistive device: No Device  Walk 150 feet activity   Assist    Assist level: Contact Guard/Touching assist Assistive device: No Device    Walk 10 feet on uneven surface  activity   Assist     Assist level: Contact Guard/Touching assist     Wheelchair     Assist Is the patient using a wheelchair?: No             Wheelchair 50 feet with 2 turns activity    Assist            Wheelchair 150 feet activity     Assist          Blood pressure 126/62, pulse 73, temperature 97.6 F (36.4 C), resp. rate 18, height 5' 1 (1.549 m), weight 96.4 kg, SpO2 96%.  Medical Problem List and Plan: 1. Functional deficits secondary to left parietal ICH             -patient may  shower             -ELOS/Goals: 10-12 days, supervision goals with   PT, OT, SLP  This patient is capable of making decisions on her own behalf  Continue CIR   2.  Antithrombotics: -DVT/anticoagulation:  Mechanical:  Antiembolism stockings, knee (TED hose) Bilateral lower extremities Pharmaceutical: continue Eliquis              -antiplatelet therapy: n/a  3. Pain Management/headaches:   -pt has remote hx of migraines. Says these headaches are similar in quality and intensity  -begin trial of topamax  -continue Fioricet  but limit d/t potential rebound h/a - Tylenol  as needed. -recommended requesting ice packs for headaches  4. Transaminitis: repeat CMP ordered for tomorrow  5. 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   6.  Seizure: LTM EEG no seizure, continue Keppra  1000 mg twice daily.  7.  Hypotension: continue to monitor BP TID  8. HLD: No meds PTA. LDL 140 goal <70. Hold statin given elevated LFTs  12. DM2: HgbA1c 6.2, goal < 7.0 Monitor glucose ac/hs with SSI Novolog .   CBG (last 3)  Recent Labs    02/28/24 1702 02/28/24 2129 02/29/24 0702  GLUCAP 155* 118* 117*   -1/24-25 Controlled continue current regimen and monitor   13. Class 3 obesity:  BMI 41.61 kg/m educate on diet and weight loss to promote overall health and mobility. Check magnesium  and vitamin D  levels tomorrow  14. Leukocytosis: repeat CBC tomorrow to trend  -1/24 WBC within normal limits today   15. Low vitamin D  level  -continue daily replacement, discussed level with patient.  She reports she has taken vitamin D  supplementation in the past.  16.  Transaminitis   -Does not appear to be getting frequent Tylenol , recheck tomorrow  LOS: 4 days A FACE TO FACE EVALUATION WAS PERFORMED  Sven SQUIBB Armistead Sult 02/29/2024, 10:32 AM     "

## 2024-02-29 NOTE — Discharge Instructions (Signed)
 Inpatient Rehab Discharge Instructions  Hailey [name]  Discharge date and time: 03/03/24  3:20 PM   Activities/Precautions/ Functional Status:  Activity: no lifting, driving, or strenuous exercise for until cleared by provider.  Diet: diabetic diet  Wound Care: none needed   Functional status:  ___ No restrictions     ___ Walk up steps independently ___ 24/7 supervision/assistance   ___ Walk up steps with assistance _X__ Intermittent supervision/assistance  ___ Bathe/dress independently ___ Walk with walker     ___ Bathe/dress with assistance _X__ Walk Independently    ___ Shower independently ___ Walk with assistance    ___ Shower with assistance __X_ No alcohol     ___ Return to work/school ________  Special Instructions:  COMMUNITY REFERRALS UPON DISCHARGE:    Outpatient: PT     OT    ST                 Agency: [facility] Phone: [phone]              Appointment Date/Time: *Please expect follow-up within 7-10 business days to schedule your appointment. If you have not received follow-up, be sure to contact the site directly.*   My questions have been answered and I understand these instructions. I will adhere to these goals and the provided educational materials after my discharge from the hospital.  Patient/Caregiver Signature _______________________________ Date __________  Clinician Signature _______________________________________ Date __________  Please bring this form and your medication list with you to all your follow-up doctor's appointments.     STROKE/TIA DISCHARGE INSTRUCTIONS SMOKING Cigarette smoking nearly doubles your risk of having a stroke & is the single most alterable risk factor  If you smoke or have smoked in the last 12 months, you are advised to quit smoking for your health. Most of the excess cardiovascular risk related to smoking disappears within a year of stopping. Ask you doctor about anti-smoking medications [place]  Quit Line: 1-800-QUIT NOW Free Smoking Cessation Classes [phone]  CHOLESTEROL Know your levels; limit fat & cholesterol in your diet  Lipid Panel     Component Value Date/Time   CHOL 233 (H) 02/21/2024 0528   TRIG 144 02/21/2024 0528   HDL 64 02/21/2024 0528   CHOLHDL 3.6 02/21/2024 0528   VLDL 29 02/21/2024 0528   LDLCALC 140 (H) 02/21/2024 0528     Many patients benefit from treatment even if their cholesterol is at goal. Goal: Total Cholesterol (CHOL) less than 160 Goal:  Triglycerides (TRIG) less than 150 Goal:  HDL greater than 40 Goal:  LDL (LDLCALC) less than 100   BLOOD PRESSURE American Stroke Association blood pressure target is less that 120/80 mm/Hg  Your discharge blood pressure is:  BP: 118/63 Monitor your blood pressure Limit your salt and alcohol intake Many individuals will require more than one medication for high blood pressure  DIABETES (A1c is a blood sugar average for last 3 months) Goal HGBA1c is under 7% (HBGA1c is blood sugar average for last 3 months)  Diabetes:   Lab Results  Component Value Date   HGBA1C 6.2 (H) 02/20/2024    Your HGBA1c can be lowered with medications, healthy diet, and exercise. Check your blood sugar as directed by your physician Call your physician if you experience unexplained or low blood sugars.  PHYSICAL ACTIVITY/REHABILITATION Goal is 30 minutes at least 4 days per week  Activity: Increase activity slowly, and No driving, Therapies: Physical Therapy: Outpatient, Occupational Therapy: Outpatient, and  Speech Therapy: Outpatient Return to work: n/a Activity decreases your risk of heart attack and stroke and makes your heart stronger.  It helps control your weight and blood pressure; helps you relax and can improve your mood. Participate in a regular exercise program. Talk with your doctor about the best form of exercise for you (dancing, walking, swimming, cycling).  DIET/WEIGHT Goal is to maintain a healthy weight   Your discharge diet is:  Diet Order             Diet heart healthy/carb modified Room service appropriate? Yes; Fluid consistency: Thin  Diet effective now                   Your height is:  Height: 5' 1 (154.9 cm) Your current weight is: Weight: 96.4 kg Your Body Mass Index (BMI) is:  BMI (Calculated): 40.18 Following the type of diet specifically designed for you will help prevent another stroke. Your goal Body Mass Index (BMI) is 19-24. Healthy food habits can help reduce 3 risk factors for stroke:  High cholesterol, hypertension, and excess weight.  RESOURCES Stroke/Support Group:  Call [phone]   STROKE EDUCATION PROVIDED/REVIEWED AND GIVEN TO PATIENT Stroke warning signs and symptoms How to activate emergency medical system (call 911). Medications prescribed at discharge. Need for follow-up after discharge. Personal risk factors for stroke. Pneumonia vaccine given: No Flu vaccine given: No My questions have been answered, the writing is legible, and I understand these instructions.  I will adhere to these goals & educational materials that have been provided to me after my discharge from the hospital.

## 2024-03-01 ENCOUNTER — Inpatient Hospital Stay (HOSPITAL_COMMUNITY)

## 2024-03-01 DIAGNOSIS — I611 Nontraumatic intracerebral hemorrhage in hemisphere, cortical: Secondary | ICD-10-CM | POA: Diagnosis not present

## 2024-03-01 LAB — GLUCOSE, CAPILLARY
Glucose-Capillary: 121 mg/dL — ABNORMAL HIGH (ref 70–99)
Glucose-Capillary: 110 mg/dL — ABNORMAL HIGH (ref 70–99)

## 2024-03-01 LAB — PROTEIN C, TOTAL: Protein C, Total: 95 % (ref 60–150)

## 2024-03-01 MED ORDER — MAGNESIUM GLUCONATE 500 (27 MG) MG PO TABS
250.0000 mg | ORAL_TABLET | Freq: Every day | ORAL | Status: DC
  Administered 2024-03-02 (×2): 250 mg via ORAL
  Filled 2024-03-01 (×2): qty 1

## 2024-03-01 NOTE — Progress Notes (Signed)
 Physical Therapy Session Note  Patient Details  Name: Hailey [name] MRN: [id number] Date of Birth: 08/15/1965  Today's Date: 03/01/2024 PT Individual Time: 0850-1003 PT Individual Time Calculation (min): 73 min   Short Term Goals: Week 1:  PT Short Term Goal 1 (Week 1): STG = LTG due to ELOS  Skilled Therapeutic Interventions/Progress Updates:  Patient seated upright on EOB on entrance to room. Patient alert and agreeable to PT session. However does relate increased pain in R knee. Explains that she did not sleep well d/t discomfort from LE pain. Attempted different positions in bed as well as in recliner but could not sleep. Pain in LE is persistent this morning despite tylenol  provided overnight. Pt relates pain feeling like RLS.   Therapeutic Activity: Transfers: Pt performed sit<>stand and stand pivot transfers throughout session with Mod I for extra time and BUE support d/t pain. No cueing required. While seated EOB, pt requires ModA to don R shoe. CGA for L shoe.   Gait Training:  Pt ambulated ~130' x1 using no AD with supervision. Demonstrated low but adequate step height. Mild antalgic gait pattern for knee. No cueing required for technique.  Pt guided in continuous reciprocation of BUE and BLE using NuStep L2 x with focus on maintaining pace within comfort levels for knee pain. Initially requires slow movements with UE assist that improves throughout. In general she is able to maintain consistent pace throughout completing 550 steps over distance of 0.3 mi. Averages 1.4 METs during bout.   During NuStep activity, discussed pt's eventual return to work. Encouraged pt to lighten physical burden of job and work with company to find a suitable position. She demos respect for company and enjoys the people she works with. Is confident she will be able to return.   Neuromuscular Re-ed: NMR facilitated during session with focus on standing balance, comprehension, word identification  with symbol recognition. Pt guided in use of cognitive task requiring reading of 3 nouns in order with 10 sec to remember sequence. Then must find images of nouns in same sequence once scattered on screen. Challenged with 3 word sequence while standing on stable surface. Demos difficulty with reading few words initially but then takes time with reading and improves throughout. Struggles minimally with finding sequence of symbols. Correctly performs 3/4 sequences. Then progressed to 4 word sequence to symbol sequence while standing on [employer]ire[employer] pad. Again takes time to read sequence but is able to complete 5/5 correctly. Pt has many errant touches to screen throughout but IS able to correctly sequence.    NMR performed for improvements in motor control and coordination, balance, sequencing, judgement, and self confidence/ efficacy in performing all aspects of mobility at highest level of independence.   Patient seated upright in recliner at end of session with brakes locked, no alarm set, and all needs within reach. NT in room and making bed.    Therapy Documentation Precautions:  Precautions Precautions: Fall Restrictions Weight Bearing Restrictions Per Provider Order: No  Vital Signs: Therapy Vitals Temp: 97.8 F (36.6 C) Pulse Rate: 75 Resp: 18 BP: (!) 101/49 Patient Position (if appropriate): Lying Oxygen Therapy SpO2: 96 % O2 Device: Room Air Pain: Pain Assessment Pain Scale: 0-10 Pain Score: 7  Pain Location: Leg Pain Intervention(s): Medication (See eMAR);Repositioned and premedicated  - appeared to assist in reducing overall pain level during session along with mobility.    Therapy/Group: Individual Therapy  Mliss DELENA Milliner PT, DPT, CSRS 03/01/2024, 7:59 AM

## 2024-03-01 NOTE — Progress Notes (Signed)
 "                                                        PROGRESS NOTE   Subjective/Complaints: Complains of worsening right sided strength and neuropathy, stat head CT ordered to r/o new CVA Headaches are improved   ROS: +intermittent headaches-improved, +impaired cognition Denies chest pain, shortness of breath, abdominal pain, constipation   Objective:   No results found. Recent Labs    02/29/24 0420  WBC 9.2  HGB 14.1  HCT 42.2  PLT 260   Recent Labs    02/29/24 0420  NA 138  K 4.0  CL 105  CO2 20*  GLUCOSE 110*  BUN 19  CREATININE 0.73  CALCIUM  9.4    Intake/Output Summary (Last 24 hours) at 03/01/2024 1025 Last data filed at 02/29/2024 1829 Gross per 24 hour  Intake 480 ml  Output --  Net 480 ml        Physical Exam: Vital Signs Blood pressure (!) 101/49, pulse 75, temperature 97.8 F (36.6 C), resp. rate 18, height 5' 1 (1.549 m), weight 96.4 kg, SpO2 96%. Gen: no distress, normal appearing, laying in bed-dark room HEENT: oral mucosa pink and moist, NCAT Cardio: RRR Chest: CTAB, normal effort, normal rate of breathing Abd: soft, non-distended, positive bowel sounds Ext: no edema Psych: pleasant, normal affect Skin: intact Neuro: Alert and oriented x3 Musculoskeletal: RUE 3/5, RLE 4-/5, LUE and LLE 5/5. Sensation is decreased in right side. Higher level cognitive deficits   Assessment/Plan: 1. Functional deficits which require 3+ hours per day of interdisciplinary therapy in a comprehensive inpatient rehab setting. Physiatrist is providing close team supervision and 24 hour management of active medical problems listed below. Physiatrist and rehab team continue to assess barriers to discharge/monitor Hailey [name] progress toward functional and medical goals  Care Tool:  Bathing    Body parts bathed by Hailey [name]: Right arm, Chest, Abdomen, Front perineal area, Buttocks, Right upper leg, Left upper leg, Left lower leg, Face, Right lower leg, Left arm          Bathing assist Assist Level: Supervision/Verbal cueing     Upper Body Dressing/Undressing Upper body dressing   What is the Hailey [name] wearing?: Pull over shirt, Bra    Upper body assist Assist Level: Contact Guard/Touching assist    Lower Body Dressing/Undressing Lower body dressing      What is the Hailey [name] wearing?: Underwear/pull up, Pants     Lower body assist Assist for lower body dressing: Supervision/Verbal cueing     Toileting Toileting Toileting Activity did not occur (Clothing management and hygiene only): N/A (no void or bm)  Toileting assist Assist for toileting: Minimal Assistance - Hailey [name] > 75%     Transfers Chair/bed transfer  Transfers assist     Chair/bed transfer assist level: Supervision/Verbal cueing     Locomotion Ambulation   Ambulation assist      Assist level: Contact Guard/Touching assist Assistive device: No Device Max distance: 200'   Walk 10 feet activity   Assist     Assist level: Contact Guard/Touching assist Assistive device: No Device   Walk 50 feet activity   Assist    Assist level: Contact Guard/Touching assist Assistive device: No Device    Walk 150 feet activity   Assist    Assist level: Contact  Guard/Touching assist Assistive device: No Device    Walk 10 feet on uneven surface  activity   Assist     Assist level: Contact Guard/Touching assist     Wheelchair     Assist Is the Hailey [name] using a wheelchair?: No             Wheelchair 50 feet with 2 turns activity    Assist            Wheelchair 150 feet activity     Assist          Blood pressure (!) 101/49, pulse 75, temperature 97.8 F (36.6 C), resp. rate 18, height 5' 1 (1.549 m), weight 96.4 kg, SpO2 96%.  Medical Problem List and Plan: 1. 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  Venous sinus thrombosis: continue Eliquis   3. Headaches -pt has remote hx of migraines. Says these headaches are similar in quality and intensity  -continue topamax  -continue Fioricet  but limit d/t potential rebound h/a - Tylenol  as needed. -recommended requesting ice packs for headaches  4. Transaminitis: repeat CMP ordered for tomorrow  5. 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 Stat CT head ordered for worsening right lower extremity weakness/ neuropathy   6.  Seizure: LTM EEG no seizure, continue Keppra  1000 mg twice daily.  7.  Hypotension: continue to monitor BP TID  8. HLD: No meds PTA. LDL 140 goal <70. Hold statin given elevated LFTs  12. DM2: HgbA1c 6.2, goal < 7.0, d/c CBGs and insulin  since CBGs are well controlled  13. Class 3 obesity:  BMI 40.16 educate on diet and weight loss to promote overall health and mobility. Check magnesium  and vitamin D  levels tomorrow  14. Leukocytosis: repeat CBC tomorrow to trend  -1/24 WBC within normal limits today   15. Low vitamin D  level  -continue daily replacement, discussed level with Hailey [name].  She reports she has taken vitamin D  supplementation in the past.  16.  Transaminitis: reviewed and has improved but ALT is still elevated  LOS: 5 days A FACE TO FACE EVALUATION WAS PERFORMED  Sven P Hailey [name] 03/01/2024, 10:25 AM     "

## 2024-03-01 NOTE — Progress Notes (Signed)
 Patient ID: Hailey [name], female   DOB: [birth year], 59 y.o.   MRN: [id number]  Short-term disability paperwork received by [employer] and provided to PA to fill out.

## 2024-03-01 NOTE — Progress Notes (Signed)
 Speech Language Pathology Daily Session Note  Patient Details  Name: Hailey [name] MRN: [id number] Date of Birth: September 16, 1965  Today's Date: 03/01/2024 SLP Individual Time: 1300-1400 SLP Individual Time Calculation (min): 60 min  Short Term Goals: Week 1: SLP Short Term Goal 1 (Week 1): Patient will read mildly complex sentences with increased processing speed with min assist. SLP Short Term Goal 2 (Week 1): Patient will solve basic visually presented problems with 80% accuracy given min assist. SLP Short Term Goal 3 (Week 1): Patient will write answers to basic problems with min assist for spelling accuracy and language fluency. SLP Short Term Goal 4 (Week 1): Patient will sustain attention to functional tasks for 20 minutes with min assist. SLP Short Term Goal 5 (Week 1): Patient will recall events from daily interactions with 80% accuracy given min assist.  Skilled Therapeutic Interventions: SLP conducted skilled therapy session targeting cognitive goals. SLP conducted basic to mildly complex menu task where patient was given an order of various items and tasked with totaling the amount spent. Patient benefited from +++processing time and min cues for visual organization of information. Completed math itself with min assist for organization and problem breakdown. Patient then read, comprehended, and sequenced 6-step tasks with supervision. In final minutes of session, targeting scanning and visual reasoning with pattern recognition task where patient identified target words in field of mixed letters with supervision-min assist and +processing time. Patient was left in room with call bell in reach and alarm set. SLP will continue to target goals per plan of care.        Pain Pain Assessment Pain Scale: 0-10 Pain Score: 7  Pain Location: Leg Pain Intervention(s): Medication (See eMAR);Repositioned  Therapy/Group: Individual Therapy  Zoey Gilkeson, M.A., CCC-SLP  Melvena Vink A Robbin Escher 03/01/2024,  8:32 AM

## 2024-03-02 MED ORDER — LIDOCAINE 5 % EX PTCH
1.0000 | MEDICATED_PATCH | CUTANEOUS | Status: DC
  Administered 2024-03-02 – 2024-03-03 (×2): 1 via TRANSDERMAL
  Filled 2024-03-02 (×2): qty 1

## 2024-03-02 NOTE — Progress Notes (Signed)
 "                                                        PROGRESS NOTE   Subjective/Complaints: Headaches improved Slept poorly last night due to right sided weakness and neuropathy, does not want additional medications for sleep or pain at night  ROS: +intermittent headaches-improved, +impaired cognition, +insomnia due to neuropathy Denies chest pain, shortness of breath, abdominal pain, constipation   Objective:   CT HEAD WO CONTRAST ( ) Result Date: 03/01/2024 EXAM: CT HEAD WITHOUT CONTRAST 03/01/2024 10:49:04 AM TECHNIQUE: CT of the head was performed without the administration of intravenous contrast. Automated exposure control, iterative reconstruction, and/or weight based adjustment of the mA/kV was utilized to reduce the radiation dose to as low as reasonably achievable. COMPARISON: CT head 02/23/2024 and earlier. 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 FINDINGS: BRAIN AND VENTRICLES: Resolved combined cerebral edema and hyperdense intraaxial hemorrhage in the left parietal lobe, not significantly changed in size or configuration since 02/23/2024 (series 3 image 22). Area of involvement roughly 5 cm as seen on coronal image 27. Hyperdense component of hemorrhage roughly 20 x 25 x 27 mm (AP x transverse x cc). Estimated volume 7 mL. The hyperdense blood is fading since the previous exam, margins are more indistinct. Less superimposed hyperdensity of the nearby left vein of [place] and superior sagittal sinus. Stable mild regional mass effect without midline shift. No new intracranial hemorrhage. No ventriculomegaly. Normal basilar cisterns. Stable gray-white differentiation. ORBITS: No gaze deviation. SINUSES: Paranasal sinuses, tympanic cavities, and mastoids are well aerated. SOFT TISSUES AND SKULL: No acute soft tissue abnormality. No skull fracture. IMPRESSION: 1. Stable left  parietal lobe venous infarct including hemorrhagic component (estimated 7 mL), the margins of which are mildly fading. Stable mild regional mass effect without midline shift. 2. No new intracranial abnormality. Electronically signed by: Helayne Hurst MD 03/01/2024 11:06 AM EST RP Workstation: [id number]   Recent Labs    02/29/24 0420  WBC 9.2  HGB 14.1  HCT 42.2  PLT 260   Recent Labs    02/29/24 0420  NA 138  K 4.0  CL 105  CO2 20*  GLUCOSE 110*  BUN 19  CREATININE 0.73  CALCIUM  9.4    Intake/Output Summary (Last 24 hours) at 03/02/2024 1010 Last data filed at 03/01/2024 1800 Gross per 24 hour  Intake 480 ml  Output --  Net 480 ml        Physical Exam: Vital Signs Blood pressure 120/67, pulse 70, temperature 97.7 F (36.5 C), resp. rate 17, height 5' 1 (1.549 m), weight 96.4 kg, SpO2 97%. Gen: no distress, normal appearing, laying in bed-dark room HEENT: oral mucosa pink and moist, NCAT Cardio: RRR Chest: CTAB, normal effort, normal rate of breathing Abd: soft, non-distended, positive bowel sounds Ext: no edema Psych: pleasant, normal affect Skin: intact Neuro: Alert and oriented x3 Musculoskeletal: RUE 3/5, RLE 4-/5, LUE and LLE 5/5. Sensation is decreased in right side. Higher level cognitive deficits, stable 1/28   Assessment/Plan: 1. Functional deficits which require 3+ hours per day of interdisciplinary therapy in a comprehensive inpatient rehab setting. Physiatrist is providing close team supervision and 24 hour management of active medical problems listed below. Physiatrist and rehab team continue to  assess barriers to discharge/monitor patient progress toward functional and medical goals  Care Tool:  Bathing    Body parts bathed by patient: Right arm, Chest, Abdomen, Front perineal area, Buttocks, Right upper leg, Left upper leg, Left lower leg, Face, Right lower leg, Left arm         Bathing assist Assist Level: Supervision/Verbal cueing      Upper Body Dressing/Undressing Upper body dressing   What is the patient wearing?: Pull over shirt, Bra    Upper body assist Assist Level: Contact Guard/Touching assist    Lower Body Dressing/Undressing Lower body dressing      What is the patient wearing?: Underwear/pull up, Pants     Lower body assist Assist for lower body dressing: Supervision/Verbal cueing     Toileting Toileting Toileting Activity did not occur (Clothing management and hygiene only): N/A (no void or bm)  Toileting assist Assist for toileting: Minimal Assistance - Patient > 75%     Transfers Chair/bed transfer  Transfers assist     Chair/bed transfer assist level: Supervision/Verbal cueing     Locomotion Ambulation   Ambulation assist      Assist level: Contact Guard/Touching assist Assistive device: No Device Max distance: 200'   Walk 10 feet activity   Assist     Assist level: Contact Guard/Touching assist Assistive device: No Device   Walk 50 feet activity   Assist    Assist level: Contact Guard/Touching assist Assistive device: No Device    Walk 150 feet activity   Assist    Assist level: Contact Guard/Touching assist Assistive device: No Device    Walk 10 feet on uneven surface  activity   Assist     Assist level: Contact Guard/Touching assist     Wheelchair     Assist Is the patient using a wheelchair?: No             Wheelchair 50 feet with 2 turns activity    Assist            Wheelchair 150 feet activity     Assist          Blood pressure 120/67, pulse 70, temperature 97.7 F (36.5 C), resp. rate 17, height 5' 1 (1.549 m), weight 96.4 kg, SpO2 97%.  Medical Problem List and Plan: 1. Functional deficits secondary to left parietal ICH             -patient may  shower             -ELOS/Goals: 10-12 days, supervision goals with  PT, OT, SLP  This patient is capable of making decisions on her own behalf  Continue  CIR  Grounds pass ordered  Discussed that CT head from 1/27 was stable   2.  Venous sinus thrombosis: continue Eliquis   3. Headaches continue topamax  -continue Fioricet  but limit d/t potential rebound h/a - Tylenol  as needed. -recommended requesting ice packs for headaches  4. Right sided buttock pain radiating into leg: lidocaine  patch ordered for right buttock  5. 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 Stat CT head ordered for worsening right lower extremity weakness/ neuropathy   6.  Seizure: LTM EEG no seizure, continue Keppra  1000 mg twice daily.  7.  Hypotension: continue to monitor BP TID  8. HLD: No meds PTA. LDL 140 goal <70. Hold statin given elevated LFTs  12. DM2: HgbA1c  6.2, goal < 7.0, d/c CBGs and insulin  since CBGs are well controlled  13. Class 3 obesity:  BMI 40.16 educate on diet and weight loss to promote overall health and mobility. Check magnesium  and vitamin D  levels tomorrow  14. Leukocytosis: repeat CBC tomorrow to trend  -1/24 WBC within normal limits today   15. Low vitamin D  level  -continue daily replacement, discussed level with patient.  She reports she has taken vitamin D  supplementation in the past.  16.  Transaminitis: reviewed and has improved but ALT is still elevated  LOS: 6 days A FACE TO FACE EVALUATION WAS PERFORMED  Les Longmore P Cordero Surette 03/02/2024, 10:10 AM     "

## 2024-03-02 NOTE — Progress Notes (Signed)
 Occupational Therapy Note  Patient Details  Name: Hailey [name] MRN: [id number] Date of Birth: 02/17/1965   Occupational Therapist participated in the interdisciplinary team conference, providing clinical information regarding the patient's current status, treatment goals, and weekly focus, including any barriers that need to be addressed. Please see the Inpatient Rehabilitation Team Conference and Plan of Care Update for further details.   Jilliam Bellmore M 03/02/2024, 11:11 AM

## 2024-03-02 NOTE — Progress Notes (Signed)
 Speech Language Pathology Daily Session Note  Patient Details  Name: Hailey [name] MRN: [id number] Date of Birth: 1965/02/05  Today's Date: 03/02/2024 SLP Individual Time: 1300-1400 SLP Individual Time Calculation (min): 60 min  Short Term Goals: Week 1: SLP Short Term Goal 1 (Week 1): Patient will read mildly complex sentences with increased processing speed with min assist. SLP Short Term Goal 2 (Week 1): Patient will solve basic visually presented problems with 80% accuracy given min assist. SLP Short Term Goal 3 (Week 1): Patient will write answers to basic problems with min assist for spelling accuracy and language fluency. SLP Short Term Goal 4 (Week 1): Patient will sustain attention to functional tasks for 20 minutes with min assist. SLP Short Term Goal 5 (Week 1): Patient will recall events from daily interactions with 80% accuracy given min assist.  Skilled Therapeutic Interventions: SLP conducted skilled therapy session targeting cognitive goals. SLP targeted visuospatial reasoning skills. Facilitated shape completion task where patient selected missing components of visual figures to match a target with patient benefiting from supervision for error identification in responses. Patient then completed clock time indication task where she placed hour and minute hands on clocks accurately with supervision assist given increased processing time. In final minutes of session, targeted functional sentence level writing where patient responded to prompts in written format, benefiting from supervision-min assist to check work and assess for spelling errors. Throughout task, patient consistently reads and comprehends mildly complex passages/instructions with supervision-light min assist. Patient was left in room with call bell in reach and alarm set. SLP will continue to target goals per plan of care.        Pain Pain Assessment Pain Scale: 0-10 Pain Score: 0-No pain  Therapy/Group:  Individual Therapy  Treyden Hakim, M.A., CCC-SLP  Treina Arscott A Maddox Bratcher 03/02/2024, 2:18 PM

## 2024-03-02 NOTE — Plan of Care (Signed)
" °  Problem: Consults Goal: RH BRAIN INJURY PATIENT EDUCATION Description: Description: See Patient Education module for eduction specifics Outcome: Progressing Goal: Nutrition Consult-if indicated Outcome: Progressing   Problem: RH SAFETY Goal: RH STG ADHERE TO SAFETY PRECAUTIONS W/ASSISTANCE/DEVICE Description: STG Adhere to Safety Precautions With cues Assistance/Device. Outcome: Progressing   Problem: RH PAIN MANAGEMENT Goal: RH STG PAIN MANAGED AT OR BELOW PT'S PAIN GOAL Description: Pain < 4 with prns Outcome: Progressing   Problem: RH KNOWLEDGE DEFICIT BRAIN INJURY Goal: RH STG INCREASE KNOWLEDGE OF SELF CARE AFTER BRAIN INJURY Description: Patient and spouse will be able to manage care using medications and dietary modification independently Outcome: Progressing   Problem: RH KNOWLEDGE DEFICIT Goal: RH STG INCREASE KNOWLEDGE OF DIABETES Description: Patient and spouse will be able to manage DM using medications and dietary modification independently Outcome: Progressing Goal: RH STG INCREASE KNOWLEDGE OF HYPERTENSION Description: Patient and spouse will be able to manage HTN using medications and dietary modification independently Outcome: Progressing Goal: RH STG INCREASE KNOWLEGDE OF HYPERLIPIDEMIA Description: Patient and spouse will be able to manage HLD using medications and dietary modification independently Outcome: Progressing   Problem: RH Vision Goal: RH LTG Vision (Specify) Outcome: Progressing   "

## 2024-03-02 NOTE — Plan of Care (Signed)
" °  Problem: Consults Goal: RH BRAIN INJURY PATIENT EDUCATION Description: Description: See Patient Education module for eduction specifics Outcome: Progressing Goal: Nutrition Consult-if indicated Outcome: Progressing Goal: Diabetes Guidelines if Diabetic/Glucose > 140 Description: If diabetic or lab glucose is > 140 mg/dl - Initiate Diabetes/Hyperglycemia Guidelines & Document Interventions  Outcome: Progressing   Problem: RH SAFETY Goal: RH STG ADHERE TO SAFETY PRECAUTIONS W/ASSISTANCE/DEVICE Description: STG Adhere to Safety Precautions With cues Assistance/Device. Outcome: Progressing   Problem: RH PAIN MANAGEMENT Goal: RH STG PAIN MANAGED AT OR BELOW PT'S PAIN GOAL Description: Pain < 4 with prns Outcome: Progressing   Problem: RH KNOWLEDGE DEFICIT BRAIN INJURY Goal: RH STG INCREASE KNOWLEDGE OF SELF CARE AFTER BRAIN INJURY Description: Patient and spouse will be able to manage care using medications and dietary modification independently Outcome: Progressing   Problem: RH KNOWLEDGE DEFICIT Goal: RH STG INCREASE KNOWLEDGE OF DIABETES Description: Patient and spouse will be able to manage DM using medications and dietary modification independently Outcome: Progressing Goal: RH STG INCREASE KNOWLEDGE OF HYPERTENSION Description: Patient and spouse will be able to manage HTN using medications and dietary modification independently Outcome: Progressing Goal: RH STG INCREASE KNOWLEGDE OF HYPERLIPIDEMIA Description: Patient and spouse will be able to manage HLD using medications and dietary modification independently Outcome: Progressing   Problem: RH Vision Goal: RH LTG Vision (Specify) Outcome: Progressing   "

## 2024-03-02 NOTE — Progress Notes (Addendum)
 Occupational Therapy Session Note  Patient Details  Name: Hailey [name] MRN: [id number] Date of Birth: [birth year]  Today's Date: 03/02/2024 OT Individual Time: 0735 - 0845 OT Treatment Time:  70 min      Short Term Goals: Week 1:  OT Short Term Goal 1 (Week 1): STG=LTG due to LOS  Skilled Therapeutic Interventions/Progress Updates:   Patient received seated at edge of bed finishing breakfast.  Patient reports back and hip pain impacting her ability to sleep.  Reports sitting up all night to try to get comfortable.   Patient showered last night - husband assisted.  Patient declined need for BADL.   Walked to gym, patient continues to report dead leg. Patient encouraged to increase stride length, and pace while walking.   Worked in supine to stretch lower back and hips.  Patient reporting numb feeling more than pain in right hip.  Continued stretch in quadruped.  Patient reports improved pain after gentle stretch.   Worked on coordination and self organization task.  Patient needing cueing to remain on task and to organize simple sorting task.  Patient with significant increase in errors without correction when attempting to maintain conversation while sorting cards.  Discussed no driving post discharge as instructed due to delayed response time, difficulty with dual tasking, visual attention to right, and to follow seizure precautions.   Patient returned to room at end of session, and returned to edge of bed with bed alarm engaged, and call bell in reach.   Therapy Documentation Precautions:  Precautions Precautions: Fall Restrictions Weight Bearing Restrictions Per Provider Order: No  Pain: Pain Assessment Pain Score: 7- right hip low back. Provided gentle stretching to hip low back in supine and quadruped - pain down to 4/10   Therapy/Group: Individual Therapy  Mistie Adney M 03/02/2024, 12:56 PM

## 2024-03-02 NOTE — Progress Notes (Signed)
 Physical Therapy Session Note  Patient Details  Name: Hailey [name] MRN: [id number] Date of Birth: 06-25-1965  Today's Date: 03/02/2024 PT Individual Time: 1600-1656 PT Individual Time Calculation (min): 56 min   Short Term Goals: Week 1:  PT Short Term Goal 1 (Week 1): STG = LTG due to ELOS  Skilled Therapeutic Interventions/Progress Updates:      Pt sitting EOB to start - has no c/o pain. IN agreement to therapy treatment.   Sit<>stand independently from EOB. Ambulates at distant supervision level from her room to the main gym, ~175'. Pt completed cognitive tasks to challenge problem solving, organization, executive function, sequencing, and complex reasoning. Tasks included complex duplication of geo figures and participating in game of [place] where she required min to mod cues for complex instructions and predictive foresight. During activity, also discussed DC plan for Friday as she was unaware - she opened up on the events that led to her CVA, slight anxiety re: returning to iADLs and life responsibilities. Spent time discussing strategies for these and progressions that lead to more independence. Pt aware that she shouldn't drive until MD clears her.   Pt returned to her room at end of session and concluded seated EOB with her needs met.   Therapy Documentation Precautions:  Precautions Precautions: Fall Restrictions Weight Bearing Restrictions Per Provider Order: No General:     Therapy/Group: Individual Therapy  [name] 03/02/2024, 7:34 AM

## 2024-03-02 NOTE — Patient Care Conference (Addendum)
 Inpatient RehabilitationTeam Conference and Plan of Care Update Date: 03/02/2024   Time: 11:06 AM    Patient Name: Hailey [name]      Medical Record Number: [id number]  Date of Birth: September 28, 1965 Sex: Female         Room/Bed: 4M06C/4M06C-01 Payor Info: Payor: HULAN / Plan: [employer] NAP / Product Type: *No Product type* /    Admit Date/Time:  02/25/2024  1:37 PM  Primary Diagnosis:  ICH (intracerebral hemorrhage) [facility])  Hospital Problems: Principal Problem:   ICH (intracerebral hemorrhage) [facility])    Expected Discharge Date: Expected Discharge Date: 03/04/24  Team Members Present: Physician leading conference: Dr. Sven Elks Social Worker Present: Waverly Gentry, LCSW-A Nurse Present: Barnie Ronde, RN PT Present: Sherlean Perks, PT OT Present: Monica Peacock, OT SLP Present: Rosina Downy, SLP PPS Coordinator present : Eleanor Colon, SLP     Current Status/Progress Goal Weekly Team Focus  Bowel/Bladder   Continent of B/B   Remain continent   Assist with toileting as needed.    Swallow/Nutrition/ Hydration               ADL's   Contact guard/ Supervision BADL   Mod I - Needs supervision at home   discharge planning, coordination RUE, Cognition, visual compensation    Mobility   Supervision bed mobility, supervision transfers and gait without an AD, supervision for stairs   mod I  dual-cog loading, multi-tasking, DC planning, dynamic gait/balance training    Communication   dyslexia and dysgraphia   supervision   reading and writing mildly complex information    Safety/Cognition/ Behavioral Observations  moderate cognitive deficits due to visuospatial reasoning, spatial organization - verbal problem solving > tasks with visual components   supervision   visuospatial reasoning, spatial organization, functional problem solving    Pain   Pt with c/o of H/A and back pain   Pain < 3/10   Assess pain Q shift and prn; give prn as ordered    Skin   Skin intact    Skin will remain intact  Assess skin Q shift and PRN      Discharge Planning:  Plans to dsicharge home with family. Has short-term disability. Will need OP therapy for PT/OT. No DME needs.   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 History of migraines, TIA, DM, HTN and HLD with fluctuating blood pressures, visual deficits and apraxia, dyslexia, and dysgraphia with poor spatial response. Recent right lower extremity weakness and neuropathy; CT head ordered. MD noted more sciatica etiology.  Patient on target to meet rehab goals: yes, currently needs CGA for ADLs and supervision for mobility. Cognitively does better with verbal problem solving; needs cues for dual tasking and scanning to the right.  *See Care Plan and progress notes for long and short-term goals.   Revisions to Treatment Plan:  Grounds pass MD adjusted medications due to visual hallucinations   Teaching Needs: Safety, medications, dietary modifications, transfers, toileting, etc.   Current Barriers to Discharge: Decreased caregiver support and Home enviroment access/layout  Possible Resolutions to Barriers: Family education OP follow up services (PT, OT, SLP) Short term disability forms processed     Medical Summary Current Status: class 3 obesity, chronic migraines, fluctuating hypotension and diastolic hypertension, venous sinus thrombosis, seizure  Barriers to Discharge: Medical stability  Barriers to Discharge Comments: class 3 obesity, chronic migraines, fluctuating hypotension and diastolic hypertension, venous sinus thrombosis, seizure Possible Resolutions to  Barriers/Weekly Focus: provide dietary education, continue topamax , continue Fioricet , continue to monitor BP TID, continue eliquis , continue Keppra    Continued Need for Acute Rehabilitation Level of Care: The patient requires daily medical management by a  physician with specialized training in physical medicine and rehabilitation for the following reasons: Direction of a multidisciplinary physical rehabilitation program to maximize functional independence : Yes Medical management of patient stability for increased activity during participation in an intensive rehabilitation regime.: Yes Analysis of laboratory values and/or radiology reports with any subsequent need for medication adjustment and/or medical intervention. : Yes   I attest that I was present, lead the team conference, and concur with the assessment and plan of the team.   Fredericka Sober B 03/02/2024, 3:13 PM

## 2024-03-03 DIAGNOSIS — Z2989 Encounter for other specified prophylactic measures: Secondary | ICD-10-CM

## 2024-03-03 DIAGNOSIS — E1165 Type 2 diabetes mellitus with hyperglycemia: Secondary | ICD-10-CM | POA: Insufficient documentation

## 2024-03-03 DIAGNOSIS — K219 Gastro-esophageal reflux disease without esophagitis: Secondary | ICD-10-CM | POA: Diagnosis present

## 2024-03-03 DIAGNOSIS — E559 Vitamin D deficiency, unspecified: Secondary | ICD-10-CM | POA: Diagnosis present

## 2024-03-03 DIAGNOSIS — R519 Headache, unspecified: Secondary | ICD-10-CM | POA: Insufficient documentation

## 2024-03-03 LAB — CBC
HCT: 40 % (ref 36.0–46.0)
Hemoglobin: 13.5 g/dL (ref 12.0–15.0)
MCH: 28.4 pg (ref 26.0–34.0)
MCHC: 33.8 g/dL (ref 30.0–36.0)
MCV: 84.2 fL (ref 80.0–100.0)
Platelets: 253 10*3/uL (ref 150–400)
RBC: 4.75 MIL/uL (ref 3.87–5.11)
RDW: 14.4 % (ref 11.5–15.5)
WBC: 8.7 10*3/uL (ref 4.0–10.5)
nRBC: 0 % (ref 0.0–0.2)

## 2024-03-03 LAB — BASIC METABOLIC PANEL WITH GFR
Anion gap: 11 (ref 5–15)
BUN: 20 mg/dL (ref 6–20)
CO2: 24 mmol/L (ref 22–32)
Calcium: 9.7 mg/dL (ref 8.9–10.3)
Chloride: 102 mmol/L (ref 98–111)
Creatinine, Ser: 0.76 mg/dL (ref 0.44–1.00)
GFR, Estimated: >60 mL/min
Glucose, Bld: 121 mg/dL — ABNORMAL HIGH (ref 70–99)
Potassium: 4 mmol/L (ref 3.5–5.1)
Sodium: 137 mmol/L (ref 135–145)

## 2024-03-03 MED ORDER — TOPIRAMATE 50 MG PO TABS: 50.0000 mg | ORAL_TABLET | Freq: Every day | ORAL | 0 refills | Status: AC

## 2024-03-03 MED ORDER — TRAMADOL HCL 50 MG PO TABS
50.0000 mg | ORAL_TABLET | Freq: Four times a day (QID) | ORAL | Status: DC | PRN
  Administered 2024-03-03: 50 mg via ORAL
  Filled 2024-03-03: qty 1

## 2024-03-03 MED ORDER — VITAMIN D3 25 MCG PO TABS: 1000.0000 [IU] | ORAL_TABLET | Freq: Every day | ORAL | 0 refills | Status: AC

## 2024-03-03 MED ORDER — GABAPENTIN 100 MG PO CAPS
100.0000 mg | ORAL_CAPSULE | Freq: Every day | ORAL | Status: DC | PRN
  Filled 2024-03-03: qty 1

## 2024-03-03 MED ORDER — L-METHYLFOLATE-B6-B12 3-35-2 MG PO TABS
1.0000 | ORAL_TABLET | Freq: Every day | ORAL | Status: DC
  Administered 2024-03-03: 1 via ORAL
  Filled 2024-03-03: qty 1

## 2024-03-03 MED ORDER — MAGNESIUM GLUCONATE 500 (27 MG) MG PO TABS: 250.0000 mg | ORAL_TABLET | Freq: Every day | ORAL | 0 refills | Status: AC

## 2024-03-03 MED ORDER — TOPIRAMATE 25 MG PO TABS: 50.0000 mg | ORAL_TABLET | Freq: Every day | ORAL | Status: DC

## 2024-03-03 MED ORDER — BUTALBITAL-APAP-CAFFEINE 50-325-40 MG PO TABS: 1.0000 | ORAL_TABLET | Freq: Four times a day (QID) | ORAL | 0 refills | Status: AC | PRN

## 2024-03-03 MED ORDER — ACETAMINOPHEN 325 MG PO TABS: 650.0000 mg | ORAL_TABLET | Freq: Four times a day (QID) | ORAL | Status: AC | PRN

## 2024-03-03 MED ORDER — TRAMADOL HCL 50 MG PO TABS: 50.0000 mg | ORAL_TABLET | Freq: Four times a day (QID) | ORAL | 0 refills | Status: AC | PRN

## 2024-03-03 MED ORDER — ASCORBIC ACID 1000 MG PO TABS: 1000.0000 mg | ORAL_TABLET | Freq: Every day | ORAL | 0 refills | Status: AC

## 2024-03-03 MED ORDER — LIDOCAINE 5 % EX PTCH: 1.0000 | MEDICATED_PATCH | CUTANEOUS | Status: AC

## 2024-03-03 MED ORDER — GABAPENTIN 100 MG PO CAPS: 100.0000 mg | ORAL_CAPSULE | Freq: Every day | ORAL | 0 refills | Status: AC | PRN

## 2024-03-03 MED ORDER — SENNOSIDES-DOCUSATE SODIUM 8.6-50 MG PO TABS: 1.0000 | ORAL_TABLET | Freq: Two times a day (BID) | ORAL | Status: AC

## 2024-03-03 NOTE — Progress Notes (Signed)
 Occupational Therapy Session Note  Patient Details  Name: Hailey [name] MRN: [id number] Date of Birth: 1965-12-05  Today's Date: 03/03/2024 OT Individual Time: 9154-9054 OT Individual Time Calculation (min): 60 min    Short Term Goals: Week 1:  OT Short Term Goal 1 (Week 1): STG=LTG due to LOS  Skilled Therapeutic Interventions/Progress Updates:   Patient received seated at edge of bed.  Patient showered independently.  Discharge planning with patient - discussed importance of recovery, alternating rest with activity.  Discussed planning visits with friends - limit amount of people, limit length of visits.  Discussed cooking with family to ensure she is following steps logically.  Discussed importance of focusing attention on one thing at a time for success.  Patient aware that she is not returning to work or driving for extended time.  Patient has adequate support between husband and children.   Patient showing significant improvement in RUE strength and coordination at this time.  Pleased with progress and apprehensive, but excited for discharge home.  Patient made independent in her room, no longer requires alarms.    Therapy Documentation Precautions:  Precautions Precautions: Fall Restrictions Weight Bearing Restrictions Per Provider Order: No   Pain: Pain Assessment Pain Score: 8  Pain Type: Acute pain Pain Location: Hip Pain Orientation: Right Pain Descriptors / Indicators: Throbbing Pain Onset: On-going Patients Stated Pain Goal: 0 Pain Intervention(s): Medication (See eMAR);Repositioned    Therapy/Group: Individual Therapy  Kaiyana Bedore M 03/03/2024, 12:54 PM

## 2024-03-03 NOTE — Progress Notes (Addendum)
 "                                                        PROGRESS NOTE   Subjective/Complaints: Headaches improved, she would like topamax  decreased to HS after discussing its negative cognitive side effects Continues to have low back pain radiating into right lower extremity  ROS: +intermittent headaches-improved, +impaired cognition, +insomnia due to neuropathy, +lower back pain radiating into RLE Denies chest pain, shortness of breath, abdominal pain, constipation   Objective:   CT HEAD WO CONTRAST ( ) Result Date: 03/01/2024 EXAM: CT HEAD WITHOUT CONTRAST 03/01/2024 10:49:04 AM TECHNIQUE: CT of the head was performed without the administration of intravenous contrast. Automated exposure control, iterative reconstruction, and/or weight based adjustment of the mA/kV was utilized to reduce the radiation dose to as low as reasonably achievable. COMPARISON: CT head 02/23/2024 and earlier. 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 FINDINGS: BRAIN AND VENTRICLES: Resolved combined cerebral edema and hyperdense intraaxial hemorrhage in the left parietal lobe, not significantly changed in size or configuration since 02/23/2024 (series 3 image 22). Area of involvement roughly 5 cm as seen on coronal image 27. Hyperdense component of hemorrhage roughly 20 x 25 x 27 mm (AP x transverse x cc). Estimated volume 7 mL. The hyperdense blood is fading since the previous exam, margins are more indistinct. Less superimposed hyperdensity of the nearby left vein of [place] and superior sagittal sinus. Stable mild regional mass effect without midline shift. No new intracranial hemorrhage. No ventriculomegaly. Normal basilar cisterns. Stable gray-white differentiation. ORBITS: No gaze deviation. SINUSES: Paranasal sinuses, tympanic cavities, and mastoids are well aerated. SOFT TISSUES AND SKULL: No acute soft  tissue abnormality. No skull fracture. IMPRESSION: 1. Stable left parietal lobe venous infarct including hemorrhagic component (estimated 7 mL), the margins of which are mildly fading. Stable mild regional mass effect without midline shift. 2. No new intracranial abnormality. Electronically signed by: Helayne Hurst MD 03/01/2024 11:06 AM EST RP Workstation: [id number]   Recent Labs    03/03/24 0439  WBC 8.7  HGB 13.5  HCT 40.0  PLT 253   Recent Labs    03/03/24 0439  NA 137  K 4.0  CL 102  CO2 24  GLUCOSE 121*  BUN 20  CREATININE 0.76  CALCIUM  9.7    Intake/Output Summary (Last 24 hours) at 03/03/2024 0953 Last data filed at 03/03/2024 0700 Gross per 24 hour  Intake 598 ml  Output --  Net 598 ml        Physical Exam: Vital Signs Blood pressure 118/63, pulse 81, temperature 97.8 F (36.6 C), temperature source Oral, resp. rate 17, height 5' 1 (1.549 m), weight 96.4 kg, SpO2 93%. Gen: no distress, normal appearing, laying in bed-dark room HEENT: oral mucosa pink and moist, NCAT Cardio: RRR Chest: CTAB, normal effort, normal rate of breathing Abd: soft, non-distended, positive bowel sounds Ext: no edema Psych: pleasant, normal affect Skin: intact Neuro: Alert and oriented x3 Musculoskeletal: RUE 3/5, RLE 4-/5, LUE and LLE 5/5. Sensation is decreased in right side. Higher level cognitive deficits, stable 1/29   Assessment/Plan: 1. Functional deficits which require 3+ hours per day of interdisciplinary therapy in a comprehensive inpatient rehab setting. Physiatrist is providing close team supervision and 24 hour management  of active medical problems listed below. Physiatrist and rehab team continue to assess barriers to discharge/monitor patient progress toward functional and medical goals  Care Tool:  Bathing    Body parts bathed by patient: Right arm, Chest, Abdomen, Front perineal area, Buttocks, Right upper leg, Left upper leg, Left lower leg, Face, Right lower  leg, Left arm         Bathing assist Assist Level: Supervision/Verbal cueing     Upper Body Dressing/Undressing Upper body dressing   What is the patient wearing?: Pull over shirt, Bra    Upper body assist Assist Level: Contact Guard/Touching assist    Lower Body Dressing/Undressing Lower body dressing      What is the patient wearing?: Underwear/pull up, Pants     Lower body assist Assist for lower body dressing: Supervision/Verbal cueing     Toileting Toileting Toileting Activity did not occur (Clothing management and hygiene only): N/A (no void or bm)  Toileting assist Assist for toileting: Minimal Assistance - Patient > 75%     Transfers Chair/bed transfer  Transfers assist     Chair/bed transfer assist level: Supervision/Verbal cueing     Locomotion Ambulation   Ambulation assist      Assist level: Contact Guard/Touching assist Assistive device: No Device Max distance: 200'   Walk 10 feet activity   Assist     Assist level: Contact Guard/Touching assist Assistive device: No Device   Walk 50 feet activity   Assist    Assist level: Contact Guard/Touching assist Assistive device: No Device    Walk 150 feet activity   Assist    Assist level: Contact Guard/Touching assist Assistive device: No Device    Walk 10 feet on uneven surface  activity   Assist     Assist level: Contact Guard/Touching assist     Wheelchair     Assist Is the patient using a wheelchair?: No             Wheelchair 50 feet with 2 turns activity    Assist            Wheelchair 150 feet activity     Assist          Blood pressure 118/63, pulse 81, temperature 97.8 F (36.6 C), temperature source Oral, resp. rate 17, height 5' 1 (1.549 m), weight 96.4 kg, SpO2 93%.  Medical Problem List and Plan: 1. Functional deficits secondary to left parietal ICH             -patient may  shower             -ELOS/Goals: 7 days,  supervision goals with  PT, OT, SLP  This patient is capable of making decisions on her own behalf  D/c home today as insurance has denied further coverage and patient is doing well functionally and medically  Grounds pass ordered  Discussed that CT head from 1/27 was stable  Metanx ordered   2.  Venous sinus thrombosis: continue Eliquis   3. Headaches Decreased topamax  to 50mg  HS -continue Fioricet  but limit d/t potential rebound h/a - Tylenol  as needed. -recommended requesting ice packs for headaches  4. Right sided buttock pain radiating into leg: lidocaine  patch ordered for right buttock  5. 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 Stat CT head ordered for worsening right lower extremity weakness/ neuropathy   6.  Seizure: LTM EEG  no seizure, continue Keppra  1000 mg twice daily.  7.  Hypotension: continue to monitor BP TID  8. HLD: No meds PTA. LDL 140 goal <70. Hold statin given elevated LFTs  12. DM2: HgbA1c 6.2, goal < 7.0, d/c CBGs and insulin  since CBGs are well controlled  13. Class 3 obesity:  BMI 40.16 educate on diet and weight loss to promote overall health and mobility. Check magnesium  and vitamin D  levels tomorrow  14. Leukocytosis: repeat CBC tomorrow to trend  -1/24 WBC within normal limits today   15. Low vitamin D  level  -continue daily replacement, discussed level with patient.  She reports she has taken vitamin D  supplementation in the past.  16.  Transaminitis: reviewed and has improved but ALT is still elevated  17. Impaired cognition: decrease topamax  to 50mg  HS  18. Back pain radiating into right lower extremity: gabapentin  100mg  daily ordered prn   >30 minutes spent in discharge of patient including review of medications and follow-up appointments, physical examination, and in answering all patient's questions    LOS: 7 days A FACE TO FACE EVALUATION WAS PERFORMED  Sven SQUIBB Viet Kemmerer 03/03/2024, 9:53 AM     "

## 2024-03-03 NOTE — Plan of Care (Signed)
" °  Problem: Consults Goal: RH BRAIN INJURY PATIENT EDUCATION Description: Description: See Patient Education module for eduction specifics Outcome: Progressing Goal: Nutrition Consult-if indicated Outcome: Progressing Goal: Diabetes Guidelines if Diabetic/Glucose > 140 Description: If diabetic or lab glucose is > 140 mg/dl - Initiate Diabetes/Hyperglycemia Guidelines & Document Interventions  Outcome: Progressing   Problem: RH SAFETY Goal: RH STG ADHERE TO SAFETY PRECAUTIONS W/ASSISTANCE/DEVICE Description: STG Adhere to Safety Precautions With cues Assistance/Device. Outcome: Progressing   Problem: RH PAIN MANAGEMENT Goal: RH STG PAIN MANAGED AT OR BELOW PT'S PAIN GOAL Description: Pain < 4 with prns Outcome: Progressing   Problem: RH KNOWLEDGE DEFICIT BRAIN INJURY Goal: RH STG INCREASE KNOWLEDGE OF SELF CARE AFTER BRAIN INJURY Description: Patient and spouse will be able to manage care using medications and dietary modification independently Outcome: Progressing   Problem: RH KNOWLEDGE DEFICIT Goal: RH STG INCREASE KNOWLEDGE OF DIABETES Description: Patient and spouse will be able to manage DM using medications and dietary modification independently Outcome: Progressing Goal: RH STG INCREASE KNOWLEDGE OF HYPERTENSION Description: Patient and spouse will be able to manage HTN using medications and dietary modification independently Outcome: Progressing Goal: RH STG INCREASE KNOWLEGDE OF HYPERLIPIDEMIA Description: Patient and spouse will be able to manage HLD using medications and dietary modification independently Outcome: Progressing   Problem: RH Vision Goal: RH LTG Vision (Specify) Outcome: Progressing   "

## 2024-03-03 NOTE — Progress Notes (Signed)
 Physical Therapy Discharge Summary  Patient Details  Name: Hailey [name] MRN: [id number] Date of Birth: 1965-07-08  Date of Discharge from PT service:March 03, 2024  Patient has met 8 of 8 long term goals due to improved activity tolerance, improved balance, improved postural control, increased strength, ability to compensate for deficits, functional use of  right upper extremity and left lower extremity, improved attention, improved awareness, and improved coordination.  Patient to discharge at an ambulatory level Independent.   Patient's care partner is independent to provide the necessary physical and cognitive assistance at discharge.  Reasons goals not met: n/a  Recommendation:  Patient will benefit from ongoing skilled PT services in outpatient setting to continue to advance safe functional mobility, address ongoing impairments in L sided awareness and strengthening, dual-cog and higher level dynamic balance training, and minimize fall risk.  Equipment: No equipment provided  Reasons for discharge: treatment goals met and discharge from hospital  Patient/family agrees with progress made and goals achieved: Yes  PT Discharge Precautions/Restrictions Precautions Precautions: Fall Restrictions Weight Bearing Restrictions Per Provider Order: No Pain Interference   Vision/Perception  Vision - History Ability to See in Adequate Light: 1 Impaired Perception Perception: Impaired Preception Impairment Details: Spatial orientation;Inattention/Neglect Perception-Other Comments: improved ability to compensate for right peripheral loss Praxis Praxis: Impaired Praxis Impairment Details: Motor planning;Ideomotor  Cognition Overall Cognitive Status: Impaired/Different from baseline Arousal/Alertness: Awake/alert Orientation Level: Oriented X4 Year: 2026 Month: January Day of Week: Correct Attention: Sustained Sustained Attention: Impaired Sustained Attention Impairment:  Functional basic;Functional complex Memory: Impaired Memory Impairment: Storage deficit;Retrieval deficit Awareness: Impaired Awareness Impairment: Emergent impairment Problem Solving: Impaired Problem Solving Impairment: Functional basic;Verbal complex Executive Function: Initiating;Organizing Reasoning: Impaired Reasoning Impairment: Functional complex;Functional basic;Verbal complex Sequencing: Impaired Sequencing Impairment: Functional basic;Verbal complex;Functional complex Organizing: Impaired Organizing Impairment: Verbal complex;Functional basic;Functional complex Initiating: Impaired Initiating Impairment: Functional basic;Functional complex Behaviors: Poor frustration tolerance;Restless Safety/Judgment: Impaired Sensation Sensation Light Touch: Impaired by gross assessment Hot/Cold: Appears Intact Proprioception: Impaired by gross assessment Stereognosis: Impaired by gross assessment Coordination Gross Motor Movements are Fluid and Coordinated: No Fine Motor Movements are Fluid and Coordinated: No Coordination and Movement Description: significant coordination deficit RUE - sensory loss and apraxia Motor  Motor Motor: Motor impersistence Motor - Discharge Observations: improved attention to right UE  Mobility Bed Mobility Bed Mobility: Supine to Sit;Sit to Supine Supine to Sit: Independent Sit to Supine: Independent Transfers Transfers: Sit to Stand;Stand to [employer] Transfers Sit to Stand: Independent Stand to Sit: Independent Stand Pivot Transfers: Independent Transfer (Assistive device): None Locomotion  Gait Ambulation: Yes Gait Assistance: Independent Gait Distance (Feet): 500 Feet Assistive device: None Gait Gait: Yes Gait Pattern: Within Functional Limits Stairs / Additional Locomotion Stairs: Yes Stairs Assistance: Independent with assistive device Stair Management Technique: Forwards;Two rails;Alternating pattern Number of Stairs:  12 Height of Stairs: 6 Wheelchair Mobility Wheelchair Mobility: No  Trunk/Postural Assessment  Cervical Assessment Cervical Assessment: Within Functional Limits Thoracic Assessment Thoracic Assessment: Within Functional Limits Lumbar Assessment Lumbar Assessment: Within Functional Limits Postural Control Postural Control: Deficits on evaluation Righting Reactions: Delayed right  Balance Balance Balance Assessed: Yes Static Sitting Balance Static Sitting - Balance Support: No upper extremity supported;Feet supported Static Sitting - Level of Assistance: 7: Independent Dynamic Sitting Balance Dynamic Sitting - Balance Support: No upper extremity supported;Feet supported;During functional activity Dynamic Sitting - Level of Assistance: 7: Independent Static Standing Balance Static Standing - Balance Support: No upper extremity supported;During functional activity Static Standing - Level of Assistance: 7: Independent Dynamic Standing  Balance Dynamic Standing - Balance Support: No upper extremity supported;During functional activity Dynamic Standing - Level of Assistance: 7: Independent Extremity Assessment      RLE Assessment RLE Assessment: Within Functional Limits LLE Assessment LLE Assessment: Within Functional Limits   Jyrah Blye P Canna Nickelson 03/03/2024, 12:53 PM

## 2024-03-03 NOTE — Progress Notes (Signed)
 Inpatient Rehabilitation Care Coordinator Discharge Note   Patient Details  Name: Hailey [name] MRN: [id number] Date of Birth: 1965/06/04   Discharge location: Home with family  Length of Stay: 7 days  Discharge activity level: Independent with assistive device  Home/community participation: Active in the communtiy  Patient response un:Yzjouy Literacy - How often do you need to have someone help you when you read instructions, pamphlets, or other written material from your doctor or pharmacy?: Never  Patient response un:Dnrpjo Isolation - How often do you feel lonely or isolated from those around you?: Never  Services provided included: MD, RD, PT, OT, SLP, RN, CM, TR, Pharmacy, SW  Financial Services:  Financial Services Utilized: Private Insurance [employer] / [employer] NAP  Choices offered to/list presented to: Patient/Spouse  Follow-up services arranged:  Outpatient    Outpatient Servicies: [facility] for PT/OT/SLP      Patient response to transportation need: Is the patient able to respond to transportation needs?: Yes In the past 12 months, has lack of transportation kept you from medical appointments or from getting medications?: No In the past 12 months, has lack of transportation kept you from meetings, work, or from getting things needed for daily living?: No   Patient/Family verbalized understanding of follow-up arrangements:  Yes  Individual responsible for coordination of the follow-up plan: Patient  Confirmed correct DME delivered: [employer]  Loreli 03/03/2024    Comments (or additional information): Patient plans to discharge home this evening with family providing care and support. She will participate in OP therapy at [facility]. No DME needs.   Summary of Stay    Date/Time Discharge Planning CSW  03/02/24 1503 Plans to dsicharge home with family. Has short-term disability. Will need OP therapy for PT/OT. No DME needs. DS       [employer]  Loreli

## 2024-03-03 NOTE — Plan of Care (Signed)
  Problem: RH Problem Solving Goal: LTG Patient will demonstrate problem solving for (SLP) Description: LTG:  Patient will demonstrate problem solving for basic/complex daily situations with cues  (SLP) Outcome: Completed/Met   Problem: RH Memory Goal: LTG Patient will use memory compensatory aids to (SLP) Description: LTG:  Patient will use memory compensatory aids to recall biographical/new, daily complex information with cues (SLP) Outcome: Completed/Met   Problem: RH Attention Goal: LTG Patient will demonstrate this level of attention during functional activites (SLP) Description: LTG:  Patient will will demonstrate this level of attention during functional activites (SLP) Outcome: Completed/Met   Problem: RH Pre-functional/Other (Specify) Goal: RH LTG SLP (Specify) 1 Description: RH LTG SLP (Specify) 1 Outcome: Completed/Met

## 2024-03-03 NOTE — Progress Notes (Signed)
 Physical Therapy Session Note  Patient Details  Name: Hailey [name] MRN: [id number] Date of Birth: 1965-09-16  Today's Date: 03/03/2024 PT Individual Time: 1500-1515 PT Individual Time Calculation (min): 15 min  and Today's Date: 03/03/2024 PT Missed Time: 30 Minutes Missed Time Reason: Other (Comment) (discharging home)  Short Term Goals: Week 1:  PT Short Term Goal 1 (Week 1): STG = LTG due to ELOS  Skilled Therapeutic Interventions/Progress Updates:      Pt ambulating in her room on arrival (has been made mod I earlier in the day). Pt reports back pain that has been bothering her during the day. Pain unrated but she received Rx prior. Pt aware that DC plan has been changed to today instead of tomorrow due to insurance purposes - pt somewhat distraught about this and upset that she may be on the hook for a large bill. Spent time planning and helping redirect. Husband arriving during session and education was done on DC planning. Family asking if Rx will be ordered through [facility] and this was confirmed with NP via secure chat. Cart and bags provided to help pack up room and belongings. Pt missed the last 30 minutes of therapy as patient is discharging home.   Therapy Documentation Precautions:  Precautions Precautions: Fall Restrictions Weight Bearing Restrictions Per Provider Order: No General:     Therapy/Group: Individual Therapy  Hailey [name] 03/03/2024, 7:36 AM

## 2024-03-03 NOTE — Progress Notes (Signed)
 Speech Language Pathology Daily Session Note  Patient Details  Name: Hailey [name] MRN: [id number] Date of Birth: [birth year]  Today's Date: 03/03/2024 SLP Individual Time: 1400-1443 SLP Individual Time Calculation (min): 43 min  Short Term Goals: Week 1: SLP Short Term Goal 1 (Week 1): Patient will read mildly complex sentences with increased processing speed with min assist. SLP Short Term Goal 2 (Week 1): Patient will solve basic visually presented problems with 80% accuracy given min assist. SLP Short Term Goal 3 (Week 1): Patient will write answers to basic problems with min assist for spelling accuracy and language fluency. SLP Short Term Goal 4 (Week 1): Patient will sustain attention to functional tasks for 20 minutes with min assist. SLP Short Term Goal 5 (Week 1): Patient will recall events from daily interactions with 80% accuracy given min assist.  Skilled Therapeutic Interventions: Skilled therapy session focused on cognitive goals. SLP facilitated session by prompting patient to share information re: self, family and medical hx. Patient did so independently. SLP targeted attention and problem solving through medication management task. Patient completed BID pill box according to written and verbalized directions with supervisionA. Patient reporting significant back pain at the end of session, therefore ambulated back to room. Nursing awareof request for pain medications.   Pain Back pain - RN aware  Therapy/Group: Individual Therapy  Maggie Dworkin M.A., CCC-SLP 03/03/2024, 7:41 AM

## 2024-03-03 NOTE — Progress Notes (Addendum)
 Occupational Therapy Session Note  Patient Details  Name: Hailey [name] MRN: [id number] Date of Birth: January 23, 1966  Today's Date: 03/03/2024 OT Individual Time: 1300-1355 OT Individual Time Calculation (min): 55 min    Short Term Goals: Week 1:  OT Short Term Goal 1 (Week 1): STG=LTG due to LOS  Skilled Therapeutic Interventions/Progress Updates:   Patient received seated in chair reading message on her phone.  Patient expressing concern regarding her insurance.  Patient walked to [facility] gift shop.  Patient needing cueing to avoid obstacles on her right (new and more crowded environment.)    Patient returned to room and was greeted by child psychotherapist. Patient faced with decision to leave today versus tomorrow.  Patient opting to leave tonght after talking with team.  She was able to reach out to her daughter and husband to inform them.  Reinforced need for rest and activity.  Support from family, and to go to OP versus HH if allowed as this would better meet her goals of returning to driving and working.  Patient expressed appreciation for her time in rehab.     Therapy Documentation Precautions:  Precautions Precautions: Fall Restrictions Weight Bearing Restrictions Per Provider Order: No   Pain:  6/10 right hip / low back, consistently repositions herself to address pain.  Uses warm compress   Therapy/Group: Individual Therapy  Rebekah Zackery M 03/03/2024, 2:11 PM

## 2024-03-03 NOTE — Discharge Summary (Signed)
 Physician Discharge Summary  Patient ID: Hailey [name] MRN: [id number] DOB/AGE: [birth year] 59 y.o.  Admit date: 02/25/2024 Discharge date: 03/03/2024  Discharge Diagnoses:  Principal Problem:   ICH (intracerebral hemorrhage) (HCC) Active Problems:   Hypercholesterolemia   Type II diabetes mellitus (HCC)   Degenerative disc disease, lumbar   Acid reflux   Anxiety   Hypertensive disorder   Morbid obesity (HCC)   Vitamin D  deficiency   Seizure prophylaxis   Intractable headache   Discharged Condition: stable  Significant Diagnostic Studies: CT HEAD WO CONTRAST ( ) Result Date: 03/01/2024 EXAM: CT HEAD WITHOUT CONTRAST 03/01/2024 10:49:04 AM TECHNIQUE: CT of the head was performed without the administration of intravenous contrast. Automated exposure control, iterative reconstruction, and/or weight based adjustment of the mA/kV was utilized to reduce the radiation dose to as low as reasonably achievable. COMPARISON: CT head 02/23/2024 and earlier. 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 FINDINGS: BRAIN AND VENTRICLES: Resolved combined cerebral edema and hyperdense intraaxial hemorrhage in the left parietal lobe, not significantly changed in size or configuration since 02/23/2024 (series 3 image 22). Area of involvement roughly 5 cm as seen on coronal image 27. Hyperdense component of hemorrhage roughly 20 x 25 x 27 mm (AP x transverse x cc). Estimated volume 7 mL. The hyperdense blood is fading since the previous exam, margins are more indistinct. Less superimposed hyperdensity of the nearby left vein of [place] and superior sagittal sinus. Stable mild regional mass effect without midline shift. No new intracranial hemorrhage. No ventriculomegaly. Normal basilar cisterns. Stable gray-white differentiation. ORBITS: No gaze deviation. SINUSES: Paranasal sinuses, tympanic cavities, and  mastoids are well aerated. SOFT TISSUES AND SKULL: No acute soft tissue abnormality. No skull fracture. IMPRESSION: 1. Stable left parietal lobe venous infarct including hemorrhagic component (estimated 7 mL), the margins of which are mildly fading. Stable mild regional mass effect without midline shift. 2. No new intracranial abnormality. Electronically signed by: Helayne Hurst MD 03/01/2024 11:06 AM EST RP Workstation: [id number]   CT HEAD WO CONTRAST ( ) Result Date: 02/23/2024 EXAM: CT HEAD WITHOUT CONTRAST 02/23/2024 06:11:00 PM TECHNIQUE: CT of the head was performed without the administration of intravenous contrast. Automated exposure control, iterative reconstruction, and/or weight based adjustment of the mA/kV was utilized to reduce the radiation dose to as low as reasonably achievable. COMPARISON: Head CT 02/22/2024. Head MRI and MRV 02/20/2024. CLINICAL HISTORY: Stroke, follow up; c/o worse headache FINDINGS: BRAIN AND VENTRICLES: Parenchymal hemorrhage and moderate surrounding edema in the left parietal lobe as well as a small amount of adjacent subarachnoid hemorrhage are unchanged from yesterday's CT. No new infarct, new hemorrhage, midline shift, hydrocephalus, or extra-axial fluid collection is identified. There is a persistent hyperdense appearance of the superior sagittal sinus diffusely and of some regional cortical veins consistent with previously shown venous sinus thrombosis. ORBITS: No acute abnormality. SINUSES: No acute abnormality. SOFT TISSUES AND SKULL: No acute soft tissue abnormality. No skull fracture. IMPRESSION: 1. 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 2. No new intracranial abnormality. Electronically signed by: Dasie Hamburg MD 02/23/2024 06:46 PM EST RP Workstation: [id number]   VAS US  LOWER EXTREMITY VENOUS (DVT) Result Date: 02/22/2024  Lower Venous DVT Study Patient Name:  Hailey [name]  Date of Exam:    02/22/2024 Medical Rec #: [id number]      Accession #:    [id number] Date of Birth: [birth year]      Patient Gender:  F Patient Age:   59 years Exam Location:  [facility] Procedure:      VAS US  LOWER EXTREMITY VENOUS (DVT) Referring Phys: ARY XU --------------------------------------------------------------------------------  Indications: Cerebral venous sinus thrombosis.  Comparison Study: No previous exams Performing Technologist: Jody Hill RVT, RDMS  Examination Guidelines: A complete evaluation includes B-mode imaging, spectral Doppler, color Doppler, and power Doppler as needed of all accessible portions of each vessel. Bilateral testing is considered an integral part of a complete examination. Limited examinations for reoccurring indications may be performed as noted. The reflux portion of the exam is performed with the patient in reverse Trendelenburg.  +---------+---------------+---------+-----------+----------+--------------+ RIGHT    CompressibilityPhasicitySpontaneityPropertiesThrombus Aging +---------+---------------+---------+-----------+----------+--------------+ CFV      Full           Yes      Yes                                 +---------+---------------+---------+-----------+----------+--------------+ SFJ      Full                                                        +---------+---------------+---------+-----------+----------+--------------+ FV Prox  Full           Yes      Yes                                 +---------+---------------+---------+-----------+----------+--------------+ FV Mid   Full           Yes      Yes                                 +---------+---------------+---------+-----------+----------+--------------+ FV DistalFull           Yes      Yes                                 +---------+---------------+---------+-----------+----------+--------------+ PFV      Full                                                         +---------+---------------+---------+-----------+----------+--------------+ POP      Full           Yes      Yes                                 +---------+---------------+---------+-----------+----------+--------------+ PTV      Full                                                        +---------+---------------+---------+-----------+----------+--------------+   +---------+---------------+---------+-----------+----------+--------------+ LEFT     CompressibilityPhasicitySpontaneityPropertiesThrombus Aging +---------+---------------+---------+-----------+----------+--------------+ CFV      Full  Yes      Yes                                 +---------+---------------+---------+-----------+----------+--------------+ SFJ      Full                                                        +---------+---------------+---------+-----------+----------+--------------+ FV Prox  Full           Yes      Yes                                 +---------+---------------+---------+-----------+----------+--------------+ FV Mid   Full           Yes      Yes                                 +---------+---------------+---------+-----------+----------+--------------+ FV DistalFull           Yes                                          +---------+---------------+---------+-----------+----------+--------------+ PFV      Full                                                        +---------+---------------+---------+-----------+----------+--------------+ POP      Full           Yes      Yes                                 +---------+---------------+---------+-----------+----------+--------------+ PTV      Full                                                        +---------+---------------+---------+-----------+----------+--------------+ PERO     Full                                                         +---------+---------------+---------+-----------+----------+--------------+     Summary: BILATERAL: - No evidence of deep vein thrombosis seen in the lower extremities, bilaterally. -No evidence of popliteal cyst, bilaterally.   *See table(s) above for measurements and observations. Electronically signed by Penne Colorado MD on 02/22/2024 at 4:10:11 PM.    Final    ECHOCARDIOGRAM COMPLETE Result Date: 02/22/2024    ECHOCARDIOGRAM REPORT   Patient Name:   Jacari Kirsten Date of Exam: 02/22/2024 Medical Rec #:  [id number]     Height:  61.0 in Accession #:    [id number]    Weight:       220.2 lb Date of Birth:  [birth year]     BSA:          1.968 m Patient Age:    58 years      BP:           115/84 mmHg Patient Gender: F             HR:           67 bpm. Exam Location:  Inpatient Procedure: 2D Echo, Cardiac Doppler, Color Doppler, Strain Analysis and Saline            Contrast Bubble Study (Both Spectral and Color Flow Doppler were            utilized during procedure). Indications:    Stroke  History:        Patient has no prior history of Echocardiogram examinations.                 Risk Factors:Diabetes.  Sonographer:    Philomena Daring Referring Phys: ERIN C LEHNER  Sonographer Comments: Global longitudinal strain was attempted. IMPRESSIONS  1. Left ventricular ejection fraction, by estimation, is 55 to 60%. The left ventricle has normal function. The left ventricle has no regional wall motion abnormalities. Left ventricular diastolic parameters were normal. The average left ventricular global longitudinal strain is -13.4 %. The global longitudinal strain is abnormal.  2. Right ventricular systolic function is normal. The right ventricular size is normal.  3. The mitral valve is abnormal. Trivial mitral valve regurgitation. No evidence of mitral stenosis.  4. The aortic valve is tricuspid. There is mild calcification of the aortic valve. Aortic valve regurgitation is not visualized. Aortic valve sclerosis is present,  with no evidence of aortic valve stenosis.  5. The inferior vena cava is normal in size with greater than 50% respiratory variability, suggesting right atrial pressure of 3 mmHg. FINDINGS  Left Ventricle: Left ventricular ejection fraction, by estimation, is 55 to 60%. The left ventricle has normal function. The left ventricle has no regional wall motion abnormalities. The average left ventricular global longitudinal strain is -13.4 %. Strain was performed and the global longitudinal strain is abnormal. The left ventricular internal cavity size was normal in size. There is no left ventricular hypertrophy. Left ventricular diastolic parameters were normal. Right Ventricle: The right ventricular size is normal. No increase in right ventricular wall thickness. Right ventricular systolic function is normal. Left Atrium: Left atrial size was normal in size. Right Atrium: Right atrial size was normal in size. Pericardium: There is no evidence of pericardial effusion. Mitral Valve: The mitral valve is abnormal. There is mild thickening of the mitral valve leaflet(s). Trivial mitral valve regurgitation. No evidence of mitral valve stenosis. Tricuspid Valve: The tricuspid valve is normal in structure. Tricuspid valve regurgitation is trivial. No evidence of tricuspid stenosis. Aortic Valve: The aortic valve is tricuspid. There is mild calcification of the aortic valve. Aortic valve regurgitation is not visualized. Aortic valve sclerosis is present, with no evidence of aortic valve stenosis. Pulmonic Valve: The pulmonic valve was normal in structure. Pulmonic valve regurgitation is trivial. No evidence of pulmonic stenosis. Aorta: The aortic root is normal in size and structure. Venous: The inferior vena cava is normal in size with greater than 50% respiratory variability, suggesting right atrial pressure of 3 mmHg. IAS/Shunts: No atrial level shunt detected by color flow Doppler. Agitated saline  contrast was given  intravenously to evaluate for intracardiac shunting. Additional Comments: 3D was performed not requiring image post processing on an independent workstation and was indeterminate.  LEFT VENTRICLE PLAX 2D LVIDd:         5.00 cm     Diastology LVIDs:         3.20 cm     LV e' medial:    8.16 cm/s LV PW:         0.80 cm     LV E/e' medial:  10.9 LV IVS:        0.90 cm     LV e' lateral:   11.40 cm/s LVOT diam:     2.00 cm     LV E/e' lateral: 7.8 LV SV:         60 LV SV Index:   31          2D Longitudinal Strain LVOT Area:     3.14 cm    2D Strain GLS Avg:     -13.4 % LV IVRT:       56 msec  LV Volumes (MOD) LV vol d, MOD A2C: 69.2 ml LV vol d, MOD A4C: 74.7 ml LV vol s, MOD A2C: 22.2 ml LV vol s, MOD A4C: 30.1 ml LV SV MOD A2C:     47.0 ml LV SV MOD A4C:     74.7 ml LV SV MOD BP:      45.2 ml RIGHT VENTRICLE             IVC RV Basal diam:  3.30 cm     IVC diam: 1.90 cm RV Mid diam:    2.40 cm RV S prime:     10.90 cm/s TAPSE (M-mode): 2.0 cm LEFT ATRIUM             Index        RIGHT ATRIUM           Index LA diam:        3.40 cm 1.73 cm/m   RA Area:     14.60 cm LA Vol (A2C):   38.3 ml 19.46 ml/m  RA Volume:   36.40 ml  18.50 ml/m LA Vol (A4C):   28.0 ml 14.23 ml/m LA Biplane Vol: 33.0 ml 16.77 ml/m  AORTIC VALVE LVOT Vmax:   93.30 cm/s LVOT Vmean:  62.400 cm/s LVOT VTI:    0.192 m  AORTA Ao Root diam: 2.50 cm MITRAL VALVE MV Area (PHT): 4.71 cm    SHUNTS MV Decel Time: 161 msec    Systemic VTI:  0.19 m MV E velocity: 89.10 cm/s  Systemic Diam: 2.00 cm MV A velocity: 75.50 cm/s MV E/A ratio:  1.18 Maude Emmer MD Electronically signed by Maude Emmer MD Signature Date/Time: 02/22/2024/2:22:56 PM    Final    CT HEAD WO CONTRAST ( ) Result Date: 02/22/2024 EXAM: CT HEAD WITHOUT CONTRAST 02/22/2024 09:25:28 AM TECHNIQUE: CT of the head was performed without the administration of intravenous contrast. Automated exposure control, iterative reconstruction, and/or weight based adjustment of the mA/kV was  utilized to reduce the radiation dose to as low as reasonably achievable. COMPARISON: CT of the head dated 02/20/2024 and 02/21/2024. CLINICAL HISTORY: Stroke, hemorrhagic. FINDINGS: BRAIN AND VENTRICLES: There is no significant interval change in appearance of intraparenchymal hemorrhage within the left parietal lobe and adjacent subarachnoid hemorrhage within the parietal sulci. There is a moderate amount of surrounding vasogenic edema. There is no mass effect or midline  shift. There continues to be increased density within the superior sagittal sinus posteriorly and superiorly, compatible with thrombosis. No evidence of acute infarct. No hydrocephalus. No extra-axial collection. ORBITS: No acute abnormality. SINUSES: No acute abnormality. SOFT TISSUES AND SKULL: No acute soft tissue abnormality. No skull fracture. IMPRESSION: 1. No significant interval change in appearance of intraparenchymal hemorrhage within the left parietal lobe and adjacent subarachnoid hemorrhage within the parietal sulci, with moderate surrounding vasogenic edema. No midline shift. 2. Increased density within the superior sagittal sinus posteriorly and superiorly, compatible with thrombosis. Electronically signed by: Evalene Coho MD 02/22/2024 09:34 AM EST RP Workstation: [id number]   Overnight EEG with video Result Date: 02/21/2024 Shelton Arlin KIDD, MD     02/22/2024  8:56 AM Patient Name: Katheryne Gorr MRN: [id number] Epilepsy Attending: Arlin KIDD Shelton Referring Physician/Provider: Judithe Rocky BROCKS, NP Duration: 02/20/2024 1758 to 02/21/2024 1758 Patient history: 59 y.o. female p/w headache x 2-3 days, who had R leg tingling/numbness and twitching at work. EEG to evaluate for seizure Level of alertness: Awake, asleep AEDs during EEG study: LEV Technical aspects: This EEG study was done with scalp electrodes positioned according to the 10-20 International system of electrode placement. Electrical activity was reviewed with band pass  filter of 1-70Hz , sensitivity of 7 uV/mm, display speed of 16mm/sec with a 60Hz  notched filter applied as appropriate. EEG data were recorded continuously and digitally stored.  Video monitoring was available and reviewed as appropriate. Description: The posterior dominant rhythm consists of 9 Hz activity of moderate voltage (25-35 uV) seen predominantly in posterior head regions, symmetric and reactive to eye opening and eye closing. Sleep was characterized by vertex waves, sleep spindles (12 to 14 Hz), maximal frontocentral region. There is continuous 3 to 6 Hz theta-delta slowing in left hemisphere. Intermittent generalized 3-6h thetas-delta slowing was also noted,  admixed with 13-15hz  beta activity. Hyperventilation and photic stimulation were not performed.   ABNORMALITY - Continuous slow, left hemisphere - Intermittent slow, generalized IMPRESSION: This study is suggestive of cortical dysfunction arising from left hemisphere likely secondary to underlying structural abnormality. Additionally there is mild diffuse encephalopathy. No seizures or epileptiform discharges were seen throughout the recording. Priyanka KIDD Shelton   CT HEAD POST STROKE FOLLOWUP/TIMED/STAT READ Result Date: 02/21/2024 EXAM: CT HEAD WITHOUT CONTRAST 02/21/2024 05:07:48 AM TECHNIQUE: CT of the head was performed without the administration of intravenous contrast. Automated exposure control, iterative reconstruction, and/or weight based adjustment of the mA/kV was utilized to reduce the radiation dose to as low as reasonably achievable. COMPARISON: 02/20/2024 CLINICAL HISTORY: Neuro deficit, acute, stroke suspected. FINDINGS: BRAIN AND VENTRICLES: Redemonstrated left parietal hemorrhage with surrounding cytotoxic edema. Unchanged adjacent subarachnoid hemorrhage. Regional mass effect. The superior sagittal sinus remains unusually dense posteriorly consistent with thrombosis. No evidence of acute infarct. No hydrocephalus. No midline  shift. ORBITS: No acute abnormality. SINUSES: No acute abnormality. SOFT TISSUES AND SKULL: No acute soft tissue abnormality. No skull fracture. IMPRESSION: 1. Redemonstrated left parietal hemorrhage with surrounding cytotoxic edema, unchanged adjacent subarachnoid hemorrhage, and regional mass effect. 2. Superior sagittal sinus remains unusually dense posteriorly, consistent with thrombosis. Electronically signed by: Evalene Coho MD 02/21/2024 05:30 AM EST RP Workstation: [id number]   CT HEAD POST STROKE FOLLOWUP/TIMED/STAT READ Result Date: 02/20/2024 EXAM: CT HEAD WITHOUT CONTRAST 02/20/2024 08:34:30 PM TECHNIQUE: CT of the head was performed without the administration of intravenous contrast. Automated exposure control, iterative reconstruction, and/or weight based adjustment of the mA/kV was utilized to reduce the radiation dose to as low as  reasonably achievable. COMPARISON: 02/20/2024 CLINICAL HISTORY: Neuro deficit, acute, stroke suspected. FINDINGS: BRAIN AND VENTRICLES: Stable intraparenchymal hemorrhage in left parietal lobe. Stable adjacent subarachnoid hemorrhage. Stable edema. Local mass effect without midline shift. No evidence of acute infarct. No hydrocephalus. No extra-axial collection. ORBITS: No acute abnormality. SINUSES: No acute abnormality. SOFT TISSUES AND SKULL: No acute soft tissue abnormality. No skull fracture. IMPRESSION: 1. Stable intraparenchymal hemorrhage in the left parietal lobe with adjacent subarachnoid hemorrhage, edema, and local mass effect without midline shift. Electronically signed by: Franky Stanford MD 02/20/2024 08:44 PM EST RP Workstation: [id number]   MR MRV HEAD W WO CONTRAST Result Date: 02/20/2024 EXAM: MRV BRAIN 02/20/2024 06:03:33 PM TECHNIQUE: Multiplanar multisequence MRV of the head was performed with and without the administration of intravenous contrast. 7 mL of gadobutrol  (GADAVIST ) 1 MMOL/ML injection was administered. Multiplanar 2D and 3D  reformatted images are provided for review. COMPARISON: None available. 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 Hemorrhagic infarct of the high left parietal lobe. FINDINGS: Diffuse thrombus is demonstrated within the superior sagittal sinus. Flow is present below the torcular and in cortical veins over the convexity that drain inferiorly. IMPRESSION: 1. Diffuse thrombus confirmed within the superior sagittal sinus. Electronically signed by: Lonni Necessary MD 02/20/2024 06:30 PM EST RP Workstation: [id number]   MR BRAIN W WO CONTRAST Result Date: 02/20/2024 EXAM: MRI BRAIN WITH AND WITHOUT CONTRAST 02/20/2024 06:01:51 PM TECHNIQUE: Multiplanar multisequence MRI of the head/brain was performed with and without the administration of 7 mL gadobutrol  (GADAVIST ) 1 MMOL/ML intravenous contrast. COMPARISON: CT head without contrast, CT angio head and neck and CT venogram 17:26. CLINICAL HISTORY: Neuro deficit, acute, stroke suspected; Stroke, follow up; Transient ischemic attack (TIA). Acute onset of right lower extremity weakness. FINDINGS: BRAIN AND VENTRICLES: Hemorrhagic infarct of the high posterior left parietal lobe, stable compared to the prior CT. Surrounding vasogenic edema is present, effacing the local sulci without midline shift. Superior sagittal sinus thrombosis is noted. Susceptibility is noted about the thrombus. T1 shortening is present within the superior sagittal sinus thrombus on the noncontrast T1 axial images. Postcontrast images demonstrate luxury perfusion to the infarct area. Enhancement is noted along the margin of the superior sagittal sinus. No acute intracranial hemorrhage. No mass effect or midline shift. No hydrocephalus. The sella is unremarkable. Normal flow voids are present, except for the superior sagittal sinus. ORBITS: No significant abnormality. SINUSES: No significant abnormality. BONES AND SOFT TISSUES: Normal bone marrow signal. No soft tissue  abnormality. IMPRESSION: 1. Stable hemorrhagic infarct in the high posterior left parietal lobe with surrounding vasogenic edema and local sulcal effacement, without midline shift. 2. Superior sagittal sinus thrombosis. Vanessa findings were discussed with Dr. Vanessa at 5:19 pm Electronically signed by: Lonni Necessary MD 02/20/2024 06:27 PM EST RP Workstation: [id number]   CT HEAD CODE STROKE WO CONTRAST Addendum Date: 02/20/2024  ADDENDUM #1* ADDENDUM: The hemorrhagic infarct is in the left parietal lobe, not the right. ---------------------------------------------------- Electronically signed by: Lonni Necessary MD 02/20/2024 05:31 PM EST RP Workstation: [id number]   Result Date: 02/20/2024 *ORIGINAL REPORT *EXAM: CT HEAD WITHOUT CONTRAST 02/20/2024 03:01:04 PM TECHNIQUE: CT of the head was performed without the administration of intravenous contrast. Automated exposure control, iterative reconstruction, and/or weight based adjustment of the mA/kV was utilized to reduce the radiation dose to as low as reasonably achievable. COMPARISON: CT head without contrast 07/26/20. CLINICAL HISTORY: Acute onset of right leg weakness. Neuro deficit, acute, stroke suspected. FINDINGS: BRAIN AND VENTRICLES: A hemorrhagic infarct in the right  parietal lobe measures 2.1 x 1.6 x 2.8 cm (volume approximately 9.4 ml). Adjacent subarachnoid hemorrhage is present. Sulcal effacement is present without midline shift. [place] stroke program early CT score (ASPECTS) Ganglionic (caudate, IC, lentiform nucleus, insula, M1-M3): 7 Supraganglionic (M4-M6): 2 Total: 9 ORBITS: No acute abnormality. SINUSES: No acute abnormality. SOFT TISSUES AND SKULL: No acute soft tissue abnormality. No skull fracture. IMPRESSION: 1. Hemorrhagic infarct in the right parietal lobe measuring 2.1 x 1.6 x 2.8 cm (volume 4.7 mL), with adjacent subarachnoid hemorrhage and sulcal effacement without midline shift. 2. ASPECTS 9. 3. these results were  communicated to Dr. Vanessa at 3:07 pm on 02/20/2024 by secure text page via the [facility] messaging system. Electronically signed by: Lonni Necessary MD 02/20/2024 03:08 PM EST RP Workstation: [id number]   CT VENOGRAM HEAD Result Date: 02/20/2024 EXAM: CT VENOGRAM WITH CONTRAST 02/20/2024 03:23:11 PM TECHNIQUE: CT venogram of the head/brain was performed with the administration of intravenous contrast. 75 mL of iohexol  (OMNIPAQUE ) 350 MG/ML injection was administered. Multiplanar reformatted images are provided for review. MIP images are provided for review. Automated exposure control, iterative reconstruction, and/or weight based adjustment of the mA/kV was utilized to reduce the radiation dose to as low as reasonably achievable. COMPARISON: None available. CLINICAL HISTORY: Stroke, hemorrhagic. FINDINGS: CT VENOGRAM: Enhancement is present at the periphery of the superior sagittal sinus. A filling defect is present in the superior sagittal sinus to the level of the trochlear. The right transverse sinus is dominant. IMPRESSION: 1. 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 Electronically signed by: Lonni Necessary MD 02/20/2024 03:41 PM EST RP Workstation: [id number]   CT ANGIO HEAD NECK W WO CM (CODE STROKE) Result Date: 02/20/2024 EXAM: CTA HEAD AND NECK WITH AND WITHOUT 02/20/2024 03:23:11 PM TECHNIQUE: CTA of the head and neck was performed with and without the administration of 75 mL of iohexol  (OMNIPAQUE ) 350 MG/ML injection. Multiplanar 2D and/or 3D reformatted images are provided for review. Automated exposure control, iterative reconstruction, and/or weight based adjustment of the mA/kV was utilized to reduce the radiation dose to as low as reasonably achievable. Stenosis of the internal carotid arteries measured using NASCET criteria. COMPARISON: CT head without contrast 12/21/2023. CLINICAL HISTORY: Neuro  deficit, acute, stroke suspected. 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 FINDINGS: CTA NECK: AORTIC ARCH AND ARCH VESSELS: Common origin of the left common carotid artery and the innominate artery is present. No dissection or arterial injury. No significant stenosis of the brachiocephalic or subclavian arteries. CERVICAL CAROTID ARTERIES: No dissection, arterial injury, or hemodynamically significant stenosis by NASCET criteria. CERVICAL VERTEBRAL ARTERIES: Mild narrowing is present at the origin of the right vertebral artery. No dissection or arterial injury. LUNGS AND MEDIASTINUM: Unremarkable. SOFT TISSUES: No acute abnormality. BONES: No acute abnormality. CTA HEAD: ANTERIOR CIRCULATION: No significant stenosis of the internal carotid arteries. 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 Both anterior cerebral arteries filled from the left. No significant stenosis of the middle cerebral arteries. No aneurysm. POSTERIOR CIRCULATION: No significant stenosis of the posterior cerebral arteries. No significant stenosis of the basilar artery. No significant stenosis of the vertebral arteries. No aneurysm. OTHER: The superior sagittal sinus is not opacified above the torcular. Prominent cortical veins are present bilaterally. No dural venous sinus thrombosis on this non-dedicated study. IMPRESSION: 1. 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 2. Nonopacification of the superior sagittal sinus above the torcular with prominent cortical veins, consistent with for superior  sagittal sinus thrombosis. 3. Mild narrowing at the origin of the right vertebral artery. Critical findings were discussed with Dr. Vanessa at 3:22 pm. Electronically signed by: Lonni Necessary MD 02/20/2024 03:39 PM EST RP Workstation: [id number]    Labs:  Basic Metabolic Panel: Recent Labs  Lab 02/26/24 0637 02/27/24 0718 02/29/24 0420 03/03/24 0439  NA 136 137 138 137  K 3.9 4.0 4.0 4.0  CL 103 103 105 102  CO2 23 22 20* 24   GLUCOSE 113* 109* 110* 121*  BUN 17 18 19 20   CREATININE 0.67 0.71 0.73 0.76  CALCIUM  9.3 9.7 9.4 9.7  MG  --  2.0  --   --     CBC: Recent Labs  Lab 02/26/24 0637 02/27/24 0718 02/29/24 0420 03/03/24 0439  WBC 10.6* 9.6 9.2 8.7  NEUTROABS 6.0 5.0  --   --   HGB 13.5 14.6 14.1 13.5  HCT 40.4 43.8 42.2 40.0  MCV 84.7 84.2 84.1 84.2  PLT 292 324 260 253    CBG: Recent Labs  Lab 02/29/24 1142 02/29/24 1630 02/29/24 2101 03/01/24 0613 03/01/24 1150  GLUCAP 100* 138* 135* 121* 110*    Brief HPI:   Marquesa Rath is a 59 y.o. female  with PMHx of HTN, HLD, DM2, TIA, myocardial bridge and migraines, presented via EMS to [facility] on [facility]/20/24 with complaints of right lower extremity hemiparesis and disorientation.  Per chart review patient was at work when symptoms started, LKW around 1330.  Code stroke was activated by EDP and CTH revealed a left parietal lobe intracranial hemorrhage measuring 4.7 ml with adjacent SAH and sulcal effacement with no midline shift.  Patient was noted to have right leg rhythmic twitching concerning for focal seizure.  She was given IV Keppra  load, Ativan , and Vimpat  with resolution.  EEG revealed continuous slowing in the left hemisphere and generalized intermittent slowing suggestive of cortical dysfunction and no seizures or elliptic form discharges.  Keppra  continued twice daily. 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  Serial CT head scans showed stable hematoma.  Hypercoagulable workup pending, ANA and cardiolipin antibodies were negative.    For blood pressure control she received Cleviprex .  2D echo EF 55 to 60%.  Bilateral lower extremity ultrasound revealed no DVT.  VTE prophylaxis heparin  with transition to Eliquis  at discharge.  She was placed on statin therapy Lipitor 40 mg daily.  Prior to arrival the patient was independent without use of DME and working at  [employer].  She lives with her spouse in a 1 level home, there are 3 steps to enter.  She currently requires mod assist +2 with mobility and basic ADLs, transfers with rolling walker. Therapy evaluations completed due to patient decreased functional mobility was admitted for a comprehensive rehab program.    Inpatient Rehabilitation Course: Lakedra Faucett was admitted to rehab 02/25/2024 for inpatient therapies to consist of PT, ST and OT at least three hours five days a week. Past admission physiatrist, therapy team and rehab RN have worked together to provide customized collaborative inpatient rehab.  Pain Management: Headaches; Continue Fioricet  but limit d/t potential rebound headaches and Topamax  50 mg at bedtime. Right sided buttock pain radiating into leg: lidocaine  patch ordered for right buttock. Back pain radiating into right lower extremity: gabapentin  100mg  and tramadol  50 daily prn.     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  ANA and cardiolipin antibodies  and Hypercoagulable workup were negative.  She was transitioned to Eliquis  on 1/21 for DVT prophylaxis. Stat CT head ordered for worsening right lower extremity weakness/ neuropathy, no new intracranial abnormalities.   Seizure: LTM EEG no seizure, continue Keppra  1000 mg twice daily.   Hypotension: continue to monitor BP TID. Education provided.    Hyperlipidemia: No meds PTA. LDL 140 goal <70. Hold statin given elevated LFTs   T2DM: HgbA1c 6.2, goal < 7.0, d/c CBGs and insulin  since CBGs are well controlled. Diabetes has been monitored with ac/hs CBG checks and SSI was use prn for tighter BS control. Diabetic teaching was completed.    Class 3 obesity:  BMI 40.16 educate on diet and weight loss to promote overall health and mobility.   Low vitamin D  level: continue daily vit d replacement, discussed level with patient.    Transaminitis: reviewed and has improved but ALT is still elevated. Follow up  with PCP outpatient.   Planned Outpatient Follow-Up:  -[facility] -PCP -PM&R   Rehab course: During patient's stay in rehab weekly team conferences were held to monitor patient's progress, set goals and discuss barriers to discharge. At admission, patient required mod assist +2 with mobility and basic ADLs, transfers with rolling walker.   Occupational Therapy: Patient has met 11 of 11 long term goals due to improved activity tolerance, improved balance, postural control, ability to compensate for deficits, functional use of  RIGHT upper and RIGHT lower extremity, improved attention, improved awareness, and improved coordination.  Patient to discharge at overall Supervision level.  Patient's care partner is independent to provide the necessary cognitive assistance at discharge.   He/She will benefit from ongoing OT in outpatient setting to continue to advance functional skills in the area of BADL.   Physical Therapy: Patient has met 8 of 8 long term goals due to improved activity tolerance, improved balance, improved postural control, increased strength, ability to compensate for deficits, functional use of  right upper extremity and left lower extremity, improved attention, improved awareness, and improved coordination.  Patient to discharge at an ambulatory level Independent.   Patient's care partner is independent to provide the necessary physical and cognitive assistance at discharge. He/She will benefit from ongoing OT in outpatient setting to continue to advance functional mobility, address ongoing impairments in left sided awareness and strengthening, dual-cog and higher level dynamic balance training.    Speech Therapy: Patient has met 4 of 4 long term goals.  Patient to discharge at overall Supervision level. Patient benefits from supervision for completion of basic problem solving tasks, sustained attention for 20 minutes, comprehension of mildly complex written information, and  recall of daily information. Patient would benefit from 24/7 supervision upon discharge to reinforce safety especially during completion of all cognitive tasks and iADLs. Patient would benefit from ongoing SLP services to target all aforementioned lingering deficits in outpatient venue.    Discharge plan was discussed with patient and husband at bedside, they verbalized understanding and agreed with plan.       Disposition:  Discharge disposition: 01-Home or Self Care       Diet: Diabetic   Special Instructions:  -No driving or operating heavy machinery until cleared by provider  -No smoking or alcohol or illicit drug use    Discharge Instructions     Ambulatory referral to Hematology / Oncology   Complete by: As directed    Cerebral venous thrombosis unknown etiology.   Ambulatory referral to Neurology   Complete by: As directed  Follow up with stroke clinic NP at [facility] in about 4-6 weeks. Thanks.   Ambulatory referral to Neuropsychology   Complete by: As directed    Requested by Dr. Lorilee for coping   Ambulatory referral to Occupational Therapy   Complete by: As directed    Eval and treat   Ambulatory referral to Physical Medicine Rehab   Complete by: As directed    Ambulatory referral to Physical Therapy   Complete by: As directed    Ambulatory referral to Speech Therapy   Complete by: As directed       Allergies as of 03/03/2024       Reactions   Hazelnut (filbert) Anaphylaxis   Penicillins Anaphylaxis   Coconut (cocos Nucifera) Other (See Comments)   Unknown reaction   Crestor  [rosuvastatin ] Other (See Comments)   Dizziness    Mounjaro [tirzepatide] Rash   Ozempic (0.25 Or 0.5 Mg-dose) [semaglutide(0.25 Or 0.5mg -dos)] Rash        Medication List     STOP taking these medications    ondansetron  4 MG tablet Commonly known as: ZOFRAN        TAKE these medications    acetaminophen  325 MG tablet Commonly known as: TYLENOL  Take 2 tablets (650  mg total) by mouth every 6 (six) hours as needed for headache (Alternate with Fioricet  so that you are giving something every 3 hours if she still has a Headache).   apixaban  5 MG Tabs tablet Commonly known as: ELIQUIS  Take 1 tablet (5 mg total) by mouth 2 (two) times daily.   ascorbic acid  1000 MG tablet Commonly known as: VITAMIN C  Take 1 tablet (1,000 mg total) by mouth daily.   atorvastatin  40 MG tablet Commonly known as: LIPITOR Take 1 tablet (40 mg total) by mouth daily.   butalbital -acetaminophen -caffeine  50-325-40 MG tablet Commonly known as: FIORICET  Take 1 tablet by mouth every 6 (six) hours as needed for headache (Alternate with Tylenol  so that you are giving something every 3 hours if she still has a Headache).   EPINEPHrine 0.3 MG/0.3ML Sosy Inject 0.3 mg as directed as needed (allergic reaction).   gabapentin  100 MG capsule Commonly known as: NEURONTIN  Take 1 capsule (100 mg total) by mouth daily as needed (Right sided sciatica).   levETIRAcetam  1000 MG tablet Commonly known as: KEPPRA  Take 1 tablet (1,000 mg total) by mouth 2 (two) times daily.   lidocaine  5 % Commonly known as: LIDODERM  Place 1 patch onto the skin daily. Remove & Discard patch within 12 hours or as directed by MD   magnesium  gluconate 500 (27 Mg) MG Tabs tablet Commonly known as: MAGONATE Take 0.5 tablets (250 mg total) by mouth at bedtime.   senna-docusate 8.6-50 MG tablet Commonly known as: Senokot-S Take 1 tablet by mouth 2 (two) times daily.   topiramate  50 MG tablet Commonly known as: TOPAMAX  Take 1 tablet (50 mg total) by mouth at bedtime. Start taking on: March 04, 2024 What changed: when to take this   traMADol  50 MG tablet Commonly known as: ULTRAM  Take 1 tablet (50 mg total) by mouth every 6 (six) hours as needed for severe pain (pain score 7-10).   vitamin D3 25 MCG tablet Commonly known as: CHOLECALCIFEROL  Take 1 tablet (1,000 Units total) by mouth daily.         Follow-up Information     Raulkar, Sven SQUIBB, MD Follow up.   Specialty: Physical Medicine and Rehabilitation Why: Office will call for appointment Contact information: [address] [place] [address] [phone]         Okey Vina GAILS, MD Follow up.   Specialty: Cardiology Why: Call for an appointment for follow up in March 2026. Contact information: [address] [place] [address] [phone]         [facility] Follow up.   Specialty: Neurology Why: Call for an appointment. Contact information: [address] [place]  [address] [phone]                Signed: Daphne LOISE Satterfield 03/03/2024, 3:33 PM

## 2024-03-03 NOTE — Progress Notes (Signed)
 Occupational Therapy Discharge Summary  Patient Details  Name: Hailey [name] MRN: [id number] Date of Birth: 07-12-1965  Date of Discharge from OT service:March 03, 2024    Patient has met 11 of 11 long term goals due to improved activity tolerance, improved balance, postural control, ability to compensate for deficits, functional use of  RIGHT upper and RIGHT lower extremity, improved attention, improved awareness, and improved coordination.  Patient to discharge at overall Supervision level.  Patient's care partner is independent to provide the necessary cognitive assistance at discharge.    Reasons goals not met: NA  Recommendation:  Patient will benefit from ongoing skilled OT services in outpatient setting to continue to advance functional skills in the area of BADL, iADL, Vocation, and Reduce care partner burden.  Equipment: No equipment provided  Reasons for discharge: treatment goals met and discharge from hospital  Patient/family agrees with progress made and goals achieved: Yes  OT Discharge Precautions/Restrictions  Precautions Precautions: Fall Restrictions Weight Bearing Restrictions Per Provider Order: No Pain Pain Assessment Pain Scale: 0-10 Pain Score: 8  Pain Type: Acute pain Pain Location: Hip Pain Orientation: Right Pain Descriptors / Indicators: Throbbing Pain Onset: On-going Patients Stated Pain Goal: 0 Pain Intervention(s): Medication (See eMAR);Hot/Cold interventions;Ambulation/increased activity;Repositioned Multiple Pain Sites: No ADL ADL Eating: Independent Where Assessed-Eating: Edge of bed Grooming: Independent Where Assessed-Grooming: Standing at sink Upper Body Bathing: Independent Where Assessed-Upper Body Bathing: Shower Lower Body Bathing: Modified independent Where Assessed-Lower Body Bathing: Shower Upper Body Dressing: Independent Where Assessed-Upper Body Dressing: Edge of bed Lower Body Dressing: Independent Where  Assessed-Lower Body Dressing: Edge of bed Toileting: Independent Where Assessed-Toileting: Teacher, Adult Education: Community Education Officer Method: Insurance Claims Handler: Not assessed Tub/Shower Transfer Method: Unable to assess Film/video Editor: Psychologist, Counselling Method: Ambulating ADL Comments: No DME needs Vision Baseline Vision/History: 1 Wears glasses Patient Visual Report: Peripheral vision impairment Vision Assessment?: Vision impaired- to be further tested in functional context Perception  Perception: Impaired Praxis Praxis: Impaired Praxis Impairment Details: Motor planning;Ideomotor Cognition Cognition Overall Cognitive Status: Impaired/Different from baseline Arousal/Alertness: Awake/alert Orientation Level: Person;Place;Situation Person: Oriented Place: Oriented Situation: Oriented Memory: Impaired Memory Impairment: Storage deficit;Retrieval deficit Attention: Sustained Sustained Attention: Impaired Sustained Attention Impairment: Functional basic;Functional complex Awareness: Impaired Awareness Impairment: Emergent impairment Problem Solving: Impaired Problem Solving Impairment: Functional basic;Verbal complex Executive Function: Initiating;Organizing Reasoning: Impaired Reasoning Impairment: Functional complex;Functional basic;Verbal complex Sequencing: Impaired Sequencing Impairment: Functional basic;Verbal complex;Functional complex Organizing: Impaired Organizing Impairment: Verbal complex;Functional basic;Functional complex Initiating: Impaired Initiating Impairment: Functional basic;Functional complex Behaviors: Poor frustration tolerance;Restless Safety/Judgment: Impaired Brief Interview for Mental Status (BIMS) Repetition of Three Words (First Attempt): 3 Temporal Orientation: Year: Correct Temporal Orientation: Month: Accurate within 5 days Temporal Orientation: Day: Correct Recall: Sock: No, could not  recall Recall: Blue: Yes, no cue required Recall: Bed: No, could not recall BIMS Summary Score: 11 Sensation Sensation Light Touch: Impaired by gross assessment Hot/Cold: Appears Intact Proprioception: Impaired by gross assessment Stereognosis: Impaired by gross assessment Coordination Gross Motor Movements are Fluid and Coordinated: No Fine Motor Movements are Fluid and Coordinated: No Coordination and Movement Description: significant coordination deficit RUE - sensory loss and apraxia Finger Nose Finger Test: significant undershooting 9 Hole Peg Test: Left 28.11 sec   Right 45.72 Motor  Motor Motor: Motor impersistence Motor - Discharge Observations: improved attention to right UE Mobility  Bed Mobility Bed Mobility: Supine to Sit;Sit to Supine Rolling Right: Independent Right Sidelying to Sit: Independent Supine to Sit: Independent Sit to Supine: Independent Transfers  Sit to Stand: Independent Stand to Sit: Independent  Trunk/Postural Assessment  Cervical Assessment Cervical Assessment: Within Functional Limits Thoracic Assessment Thoracic Assessment: Within Functional Limits Lumbar Assessment Lumbar Assessment: Within Functional Limits Postural Control Postural Control: Deficits on evaluation Righting Reactions: Delayed right  Balance Balance Balance Assessed: Yes Static Sitting Balance Static Sitting - Balance Support: No upper extremity supported;Feet supported Static Sitting - Level of Assistance: 7: Independent Dynamic Sitting Balance Dynamic Sitting - Balance Support: No upper extremity supported;Feet supported;During functional activity Dynamic Sitting - Level of Assistance: 7: Independent Static Standing Balance Static Standing - Balance Support: No upper extremity supported;During functional activity Static Standing - Level of Assistance: 7: Independent Dynamic Standing Balance Dynamic Standing - Balance Support: No upper extremity  supported;During functional activity Dynamic Standing - Level of Assistance: 7: Independent Extremity/Trunk Assessment RUE Assessment RUE Assessment: Exceptions to [facility] Active Range of Motion (AROM) Comments: Has nearly full range of motion but motor impersistence and proprioceptive deficits which impede function RUE Body System: Neuro Brunstrum levels for arm and hand: Arm;Hand Brunstrum level for arm: Stage V Relative Independence from Synergy Brunstrum level for hand: Stage VI Isolated joint movements LUE Assessment LUE Assessment: Within Functional Limits   Fransico Allean HERO 03/03/2024, 3:34 PM

## 2024-03-03 NOTE — Progress Notes (Signed)
 Speech Language Pathology Discharge Summary  Patient Details  Name: Jeanine Caven MRN: [id number] Date of Birth: August 15, 1965  Date of Discharge from SLP service:March 03, 2024  Patient has met 4 of 4 long term goals.  Patient to discharge at overall Supervision level.  Reasons goals not met: n/a   Clinical Impression/Discharge Summary:   Patient has made excellent progress towards therapy goals meeting 4/4 long term goals set for duration of admission. Patient benefits from supervision for completion of basic problem solving tasks, sustained attention for 20 minutes, comprehension of mildly complex written information, and recall of daily information. Patient would benefit from 24/7 supervision upon discharge to reinforce safety especially during completion of all cognitive tasks and iADLs. Patient would benefit from ongoing SLP services to target all aforementioned lingering deficits in outpatient venue. Patient and family education complete. SLP will sign off.   Care Partner:  Caregiver Able to Provide Assistance: Yes  Type of Caregiver Assistance: Cognitive  Recommendation:  Outpatient SLP  Rationale for SLP Follow Up: Maximize cognitive function and independence   Equipment: n/a   Reasons for discharge: Treatment goals met;Discharged from hospital   Patient/Family Agrees with Progress Made and Goals Achieved: Yes   Rosina Downy, M.A., CCC-SLP  Ashley A Ellin 03/03/2024, 11:06 AM

## 2024-03-04 NOTE — Progress Notes (Signed)
 Inpatient Rehabilitation Discharge Medication Review by a Pharmacist  A complete drug regimen review was completed for this patient to identify any potential clinically significant medication issues.  High Risk Drug Classes Is patient taking? Indication by Medication  Antipsychotic No   Anticoagulant Yes Apixaban  -venous thrombosis  Antibiotic No   Opioid Yes Fioricet  - headache Tramadol  - pain  Antiplatelet No   Hypoglycemics/insulin  No   Vasoactive Medication No   Chemotherapy No   Other Yes Atorvastatin  - HLD Tylenol  - pain EpiPen - allergic reaction Gabapentin  trial - HA Keppra /topamax  - seizure Trazadone - sleep Vit C/D/magnesium  - supplementation Lidocaine  patch - pain Senna -S - constipation     Type of Medication Issue Identified Description of Issue Recommendation(s)  Drug Interaction(s) (clinically significant)     Duplicate Therapy     Allergy     No Medication Administration End Date     Incorrect Dose     Additional Drug Therapy Needed     Significant med changes from prior encounter (inform family/care partners about these prior to discharge).    Other       Clinically significant medication issues were identified that warrant physician communication and completion of prescribed/recommended actions by midnight of the next day: No  Name of provider notified for urgent issues identified:   Provider Method of Notification:     Pharmacist comments:   Time spent performing this drug regimen review (minutes):  20   Sergio Batch, PharmD, [place], AAHIVP, CPP Infectious Disease Pharmacist 03/04/2024 8:50 AM

## 2024-03-08 ENCOUNTER — Other Ambulatory Visit: Payer: Self-pay

## 2024-03-08 ENCOUNTER — Ambulatory Visit (HOSPITAL_COMMUNITY)

## 2024-03-08 DIAGNOSIS — R262 Difficulty in walking, not elsewhere classified: Secondary | ICD-10-CM

## 2024-03-08 DIAGNOSIS — I611 Nontraumatic intracerebral hemorrhage in hemisphere, cortical: Secondary | ICD-10-CM

## 2024-03-08 DIAGNOSIS — M6281 Muscle weakness (generalized): Secondary | ICD-10-CM

## 2024-03-08 NOTE — Therapy (Signed)
 " OUTPATIENT PHYSICAL THERAPY LOWER EXTREMITY NEURO EVALUATION   Patient Name: Hailey [name] MRN: [id number] DOB:[birth year], 59 y.o., female Today's Date: 03/08/2024  END OF SESSION:  PT End of Session - 03/08/24 1117     Visit Number 1    Number of Visits 12    Date for Recertification  04/22/24    Authorization Type Aetna    PT Start Time 1116    PT Stop Time 1156    PT Time Calculation (min) 40 min    Activity Tolerance Patient tolerated treatment well    Behavior During Therapy [facility] for tasks assessed/performed          Past Medical History:  Diagnosis Date   Angina at rest    Anxiety    Chest pain    Diabetes mellitus without complication (HCC)    High cholesterol    High cholesterol    History of angina    History of TIA (transient ischemic attack) 10/11/2020   Hypercholesterolemia 10/11/2020   Hyperlipidemia    Migraine    Personal history of COVID-19 10/11/2020   Radiculopathy of cervical region    TIA (transient ischemic attack)    Transient cerebral ischemic attack    Type II diabetes mellitus (HCC) 10/11/2020   Past Surgical History:  Procedure Laterality Date   BUNIONECTOMY     CHOLECYSTECTOMY     Patient Active Problem List   Diagnosis Date Noted   Acid reflux 03/03/2024   Hyperglycemia due to type 2 diabetes mellitus (HCC) 03/03/2024   Morbid obesity (HCC) 03/03/2024   Vitamin D  deficiency 03/03/2024   Seizure prophylaxis 03/03/2024   Intractable headache 03/03/2024   ICH (intracerebral hemorrhage) (HCC) 02/20/2024   Tear of gluteus minimus tendon, right, initial encounter 05/09/2021   Degenerative disc disease, lumbar 01/29/2021   Degenerative disc disease, cervical 01/29/2021   Somatic dysfunction of spine, cervical 01/29/2021   History of TIA (transient ischemic attack) 10/11/2020   Hypercholesterolemia 10/11/2020   Type II diabetes mellitus (HCC) 10/11/2020   Personal history of COVID-19 10/11/2020   Anxiety 05/17/2019   Hypertensive  disorder 05/17/2019    PCP: Kip Righter, MD  REFERRING PROVIDER: Jerilynn Daphne SAILOR, NP  REFERRING DIAG:  I61.1 (ICD-10-CM) - Nontraumatic cortical hemorrhage of left cerebral hemisphere [facility])    THERAPY DIAG:  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  While at work she could not feel her right foot and had right leg pain. Went to the ED via ambulance.  Diagnosed with Brain bleed; after hospitalization went to CIR; discharged 03/03/2024; right leg feels heavy; right arm still has some numbness and tingling.    PERTINENT HISTORY: Migraine DM anxiety PAIN:  Are you having pain? No  PRECAUTIONS: Fall  RED FLAGS: None   WEIGHT BEARING RESTRICTIONS: No  FALLS:  Has patient fallen in last 6 months? No  OCCUPATION: works at [employer]  PLOF: Independent  PATIENT GOALS: walk better; better balance; be independent and go back to work  NEXT MD VISIT: PRN  OBJECTIVE:  Note: Objective measures were completed at Evaluation unless otherwise noted.  DIAGNOSTIC FINDINGS:   IMPRESSION: 1. Stable left parietal lobe venous infarct including hemorrhagic component (estimated 7 mL), the margins of which are mildly fading. Stable mild regional mass effect without midline shift. 2. No new intracranial abnormality.  Electronically signed by: Helayne Hurst MD 03/01/2024 11:06 AM EST RP Workstation: [id number] PATIENT SURVEYS:  LEFS  Extreme difficulty/unable (0), Quite a bit of difficulty (1), Moderate difficulty (2), Little difficulty (3), No difficulty (4) Survey date:    Any of your usual work, housework or school activities   2. Usual hobbies, recreational or sporting activities   3. Getting into/out of the bath   4. Walking between  rooms   5. Putting on socks/shoes   6. Squatting    7. Lifting an object, like a bag of groceries from the floor   8. Performing light activities around your home   9. Performing heavy activities around your home   10. Getting into/out of a car   11. Walking 2 blocks   12. Walking 1 mile   13. Going up/down 10 stairs (1 flight)   14. Standing for 1 hour   15.  sitting for 1 hour   16. Running on even ground   17. Running on uneven ground   18. Making sharp turns while running fast   19. Hopping    20. Rolling over in bed   Score total:  15/80; 18.8%     COGNITION: Overall cognitive status: Within functional limits for tasks assessed     SENSATION: Right arm altered sensation; right handed  EDEMA:    POSTURE: No Significant postural limitations; guarded head movement  PALPATION:   LOWER EXTREMITY ROM:  Active ROM Right eval Left eval  Hip flexion    Hip extension    Hip abduction    Hip adduction    Hip internal rotation    Hip external rotation    Knee flexion    Knee extension    Ankle dorsiflexion    Ankle plantarflexion    Ankle inversion    Ankle eversion     (Blank rows = not tested)  LOWER EXTREMITY MMT:  MMT Right eval Left eval  Hip flexion 4- 4+  Hip extension    Hip abduction    Hip adduction    Hip internal rotation    Hip external rotation    Knee flexion    Knee extension 4- 5  Ankle dorsiflexion 4- 5  Ankle plantarflexion    Ankle inversion    Ankle eversion     (Blank rows = not tested)  FUNCTIONAL TESTS:  5 times sit to stand: 30.57 sec using hands to push up to standing 2 minute walk test: 232 ft no AD occasionally with path deviation SLS 2 right; 2 left  GAIT: Distance walked: 232 ft without AD Assistive device utilized: None Level of assistance: SBA Comments: occasional misstep                                                                                                                                TREATMENT  DATE: 03/08/2024 physical therapy evaluation and HEP instruction    PATIENT EDUCATION:  Education details: Patient educated on exam findings, POC, scope of PT, HEP, and what to expect next visit. Person educated: Patient Education method: Explanation, Demonstration, and Handouts Education comprehension: verbalized understanding, returned demonstration, verbal cues required, and tactile cues required  HOME EXERCISE PROGRAM: Access Code: [email or web address] URL: [email or web address] Date: 03/08/2024 Prepared by: AP - Rehab  Exercises - Sit to Stand with Armchair  - 2 x daily - 7 x weekly - 1 sets - 10 reps - Standing Single Leg Stance with Counter Support  - 2 x daily - 7 x weekly - 1 sets - 10 reps - 10 sec hold - Standing Marching  - 2 x daily - 7 x weekly - 1 sets - 10 reps  ASSESSMENT:  CLINICAL IMPRESSION: Patient is a 59 y.o. female who was seen today for physical therapy evaluation and treatment for  I61.1 (ICD-10-CM) - Nontraumatic cortical hemorrhage of left cerebral hemisphere [facility])  Patient demonstrates decreased strength, balance deficits and gait abnormalities which are negatively impacting patient ability to perform ADLs and functional mobility tasks. Patient will benefit from skilled physical therapy services to address these deficits to improve level of function with ADLs, functional mobility tasks, and reduce risk for falls.     OBJECTIVE IMPAIRMENTS: Abnormal gait, decreased activity tolerance, decreased balance, decreased cognition, decreased coordination, decreased mobility, difficulty walking, decreased strength, and impaired perceived functional ability.   ACTIVITY LIMITATIONS: carrying, lifting, bending, sitting, standing, squatting, sleeping, stairs, transfers, bed mobility, dressing, hygiene/grooming, and locomotion level  PARTICIPATION LIMITATIONS: meal prep, cleaning, laundry, medication management, driving, shopping, and community activity  EHAB  POTENTIAL: Good  CLINICAL DECISION MAKING: Evolving/moderate complexity  EVALUATION COMPLEXITY: Moderate   GOALS: Goals reviewed with patient? No  SHORT TERM GOALS: Target date: 03/29/2024 patient will be independent with initial HEP and compliant with HEP 3-4 times a week   Baseline: Goal status: INITIAL  2.  Patient will report 50% improvement overall  Baseline:  Goal status: INITIAL  3.  Patient will able to stand SLS on each leg x 5 to demonstrate improved functional balance  Baseline:  Goal status: INITIAL   LONG TERM GOALS: Target date: 04/22/2024  Patient will be independent in self management strategies to improve quality of life and functional outcomes.  Baseline:  Goal status: INITIAL  2.  Patient will report 70% improvement overall  Baseline:  Goal status: INITIAL  3.  Patient will able to stand SLS on each leg x 10 to demonstrate improved functional balance Baseline:  Goal status: INITIAL  4.  Patient will improve LEFS score by  15 points to demonstrate improved perceived function  Baseline: 15/80; 18.8% Goal status: INITIAL  5.  Patient will increase distance on 2 MWT to 300 ft or more with RW to demonstrate improved speed and efficiency with household and community ambulation  Baseline: 232 ft Goal status: INITIAL   PLAN:  PT FREQUENCY: 2x/week  PT DURATION: 6 weeks  PLANNED INTERVENTIONS: [id number]- PT Re-evaluation, 97110-Therapeutic exercises, 97530- Therapeutic activity, 97112- Neuromuscular re-education, 97535- Self Care, [id number]- Manual therapy, [id number]- Gait training, [phone]- Orthotic Fit/training, [phone]- Canalith repositioning, [id number]- Aquatic Therapy, 97760- Splinting, 97597- Wound care (first 20 sq cm), 97598- Wound care (each additional 20 sq cm)Patient/Family education, Balance training, Stair training, Taping, Dry Needling, Joint mobilization, Joint manipulation, Spinal manipulation, Spinal mobilization, Scar mobilization, and DME  instructions.   PLAN FOR NEXT SESSION: scheduled for OT and ST as well; review HEP and goals; please test dgi next visit if able; lower extremity strengthening,  gait and balance training.  11:58 AM, 03/08/24 Marlin Brys Small Shonte Beutler MPT [facility] [place] [phone] Ph:[phone]  "

## 2024-03-09 ENCOUNTER — Encounter (HOSPITAL_COMMUNITY): Payer: Self-pay | Admitting: Speech Pathology

## 2024-03-09 ENCOUNTER — Ambulatory Visit: Admitting: Family Medicine

## 2024-03-09 ENCOUNTER — Ambulatory Visit (HOSPITAL_COMMUNITY): Admitting: Speech Pathology

## 2024-03-09 ENCOUNTER — Other Ambulatory Visit: Payer: Self-pay

## 2024-03-09 DIAGNOSIS — R41841 Cognitive communication deficit: Secondary | ICD-10-CM

## 2024-03-09 NOTE — Therapy (Incomplete)
 " OUTPATIENT SPEECH LANGUAGE PATHOLOGY EVALUATION   Patient Name: Hailey [name] MRN: [id number] DOB:November 02, 1965, 59 y.o., female Today's Date: 03/09/2024  PCP: Kip Righter, MD REFERRING PROVIDER: Jerilynn Daphne SAILOR, NP  END OF SESSION:  End of Session - 03/09/24 1028     Visit Number 1    Number of Visits 9    Date for Recertification  04/21/24    Authorization Type Aetna NAP   Eff 02/04/24  Ded-250 met   Oop-1500 met 490.36  Coins-10%  Auth-no  aetna   SLP Start Time 0950    SLP Stop Time  1033    SLP Time Calculation (min) 43 min    Activity Tolerance Patient tolerated treatment well          Past Medical History:  Diagnosis Date   Angina at rest    Anxiety    Chest pain    Diabetes mellitus without complication (HCC)    High cholesterol    High cholesterol    History of angina    History of TIA (transient ischemic attack) 10/11/2020   Hypercholesterolemia 10/11/2020   Hyperlipidemia    Migraine    Personal history of COVID-19 10/11/2020   Radiculopathy of cervical region    TIA (transient ischemic attack)    Transient cerebral ischemic attack    Type II diabetes mellitus (HCC) 10/11/2020   Past Surgical History:  Procedure Laterality Date   BUNIONECTOMY     CHOLECYSTECTOMY     Patient Active Problem List   Diagnosis Date Noted   Acid reflux 03/03/2024   Hyperglycemia due to type 2 diabetes mellitus (HCC) 03/03/2024   Morbid obesity (HCC) 03/03/2024   Vitamin D  deficiency 03/03/2024   Seizure prophylaxis 03/03/2024   Intractable headache 03/03/2024   ICH (intracerebral hemorrhage) (HCC) 02/20/2024   Tear of gluteus minimus tendon, right, initial encounter 05/09/2021   Degenerative disc disease, lumbar 01/29/2021   Degenerative disc disease, cervical 01/29/2021   Somatic dysfunction of spine, cervical 01/29/2021   History of TIA (transient ischemic attack) 10/11/2020   Hypercholesterolemia 10/11/2020   Type II diabetes mellitus (HCC) 10/11/2020    Personal history of COVID-19 10/11/2020   Anxiety 05/17/2019   Hypertensive disorder 05/17/2019    ONSET DATE: 02/20/2024  REFERRING DIAG: I61.1 (ICD-10-CM) - 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 Code stroke activated by EDP. 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 Therapy ongoing and pt has been recommended for CIR.  PAIN:  Are you having pain? {OPRCPAIN:27236}  FALLS: Has patient fallen in last 6 months?  {DOEQJOOD:74320}  LIVING ENVIRONMENT: Lives with: lives with their spouse Lives in: House/apartment  PLOF:  Level of assistance: Independent with ADLs, Independent with IADLs Employment: Environmental Education Officer employment, Other: Biochemist, Clinical, return to vendor  PATIENT GOALS: ***  OBJECTIVE:  Note: Objective measures were completed at Evaluation unless otherwise noted.  DIAGNOSTIC FINDINGS:  MRI BRAIN WITH AND WITHOUT CONTRAST 02/20/2024  CT HEAD WITHOUT CONTRAST 03/01/2024  COGNITION: Overall cognitive status: Impaired: {Impaired cognition:34336} Areas of impairment:  {cognitiveimpairmentslp:27409} Functional deficits: ***  AUDITORY COMPREHENSION:  Overall auditory comprehension: {IMPAIRED:25374} YES/NO questions: {IMPAIRED:25374} Following directions: {IMPAIRED:25374} Conversation: {SLP conversation:25430} Interfering components: {SLP interfering components:25431} Effective technique: {SLP effective technique:25432}  READING COMPREHENSION:  {SLPreadingcomprehension:27140}  EXPRESSION: {SLP EXPRESSION:25433}  VERBAL EXPRESSION: Level of generative/spontaneous verbalization: {SLP level of generative/spontaneious verbalization:25435} Automatic speech: {SLP ATOMIC SPEECH:25434}  Repetition: {SLPrepetion:27212} Naming: {SLPnaming:27214} Pragmatics: {slppragmatics:27216} Comments: *** Interfering components: {SLP INTERFERING COMPONENTS:25436} Effective technique: {SLP EFFECTIVE TECHNIQUE:25437} Non-verbal means of communication: {SLP non verbal means of communication:25438}  WRITTEN EXPRESSION: Dominant hand: {RIGHT/LEFT:20294} Written expression: {slpwrittenexp:27209}  MOTOR SPEECH: Overall motor speech: {slpimpaired:27210} Level of impairment: {SLP level of impairment:25441} Respiration: {respbreathing:27195} Phonation: {SLP phonation:25439} Resonance: {SLP resonance:25440} Articulation: {SLParticulation:27218} Intelligibility: {SLP Intelligible:25442} Motor planning: {slpmotorspeecherrors:27220} Motor speech errors: {SLP motor speech errors:25443} Interfering components: {SLP Interfering components (MS):25444} Effective technique: {SLP effective technique (MS):25445}  ORAL MOTOR EXAMINATION: Overall status: WFL Comments: ***  STANDARDIZED ASSESSMENTS: {SLPstandardizedassessment:27092}  PATIENT REPORTED OUTCOME MEASURES (PROM): {SLPPROM:27095}                                                                                                                            TREATMENT DATE: 03/09/24    PATIENT EDUCATION: Education details: *** Person educated: {Person educated:25204} Education method: {Education Method:25205} Education comprehension: {Education Comprehension:25206}   GOALS: Goals reviewed with patient? Yes  SHORT TERM GOALS: Target date: ***  *** Baseline: Goal status: INITIAL  2.  *** Baseline:  Goal status: INITIAL  3.  *** Baseline:  Goal status: INITIAL  4.  *** Baseline:   Goal status: INITIAL  5.  *** Baseline:  Goal status: INITIAL  6.  *** Baseline:  Goal status: INITIAL  LONG TERM GOALS: Target date: ***  *** Baseline:  Goal status: INITIAL  2.  *** Baseline:  Goal status: INITIAL  3.  *** Baseline:  Goal status: INITIAL  4.  *** Baseline:  Goal status: INITIAL  5.  *** Baseline:  Goal status: INITIAL  6.  *** Baseline:  Goal status: INITIAL  ASSESSMENT:  CLINICAL IMPRESSION: Patient is a *** y.o. *** who was seen today for ***.   OBJECTIVE IMPAIRMENTS: include {SLPOBJIMP:27107}. These impairments are limiting patient from {SLPLIMIT:27108}. Factors affecting potential to achieve goals and functional outcome are {SLP factors:25450}. Patient will benefit from skilled SLP services to address above impairments and improve overall function.  REHAB POTENTIAL: Excellent  PLAN:  SLP FREQUENCY: 2x/week  SLP DURATION: 4 weeks  PLANNED INTERVENTIONS: {SLP treatment/interventions:25449}   Thank you,  Lamar Candy, CCC-SLP [phone]  Arnika Larzelere, CCC-SLP 03/09/2024, 10:31 AM      "

## 2024-03-10 ENCOUNTER — Ambulatory Visit (HOSPITAL_COMMUNITY)

## 2024-03-10 DIAGNOSIS — I611 Nontraumatic intracerebral hemorrhage in hemisphere, cortical: Secondary | ICD-10-CM

## 2024-03-10 DIAGNOSIS — R262 Difficulty in walking, not elsewhere classified: Secondary | ICD-10-CM

## 2024-03-10 DIAGNOSIS — M6281 Muscle weakness (generalized): Secondary | ICD-10-CM

## 2024-03-10 NOTE — Therapy (Signed)
 " OUTPATIENT PHYSICAL THERAPY LOWER EXTREMITY NEURO EVALUATION   Patient Name: Hailey [name] MRN: [id number] DOB:1965-09-21, 59 y.o., female Today's Date: 03/10/2024  END OF SESSION:  PT End of Session - 03/10/24 1131     Visit Number 2    Number of Visits 12    Date for Recertification  04/22/24    Authorization Type [employer]    PT Start Time 1120    PT Stop Time 1200    PT Time Calculation (min) 40 min    Activity Tolerance Patient tolerated treatment well    Behavior During Therapy [facility] for tasks assessed/performed          Past Medical History:  Diagnosis Date   Angina at rest    Anxiety    Chest pain    Diabetes mellitus without complication (HCC)    High cholesterol    High cholesterol    History of angina    History of TIA (transient ischemic attack) 10/11/2020   Hypercholesterolemia 10/11/2020   Hyperlipidemia    Migraine    Personal history of COVID-19 10/11/2020   Radiculopathy of cervical region    TIA (transient ischemic attack)    Transient cerebral ischemic attack    Type II diabetes mellitus (HCC) 10/11/2020   Past Surgical History:  Procedure Laterality Date   BUNIONECTOMY     CHOLECYSTECTOMY     Patient Active Problem List   Diagnosis Date Noted   Acid reflux 03/03/2024   Hyperglycemia due to type 2 diabetes mellitus (HCC) 03/03/2024   Morbid obesity (HCC) 03/03/2024   Vitamin D  deficiency 03/03/2024   Seizure prophylaxis 03/03/2024   Intractable headache 03/03/2024   ICH (intracerebral hemorrhage) (HCC) 02/20/2024   Tear of gluteus minimus tendon, right, initial encounter 05/09/2021   Degenerative disc disease, lumbar 01/29/2021   Degenerative disc disease, cervical 01/29/2021   Somatic dysfunction of spine, cervical 01/29/2021   History of TIA (transient ischemic attack) 10/11/2020   Hypercholesterolemia 10/11/2020   Type II diabetes mellitus (HCC) 10/11/2020   Personal history of COVID-19 10/11/2020   Anxiety 05/17/2019   Hypertensive  disorder 05/17/2019    PCP: Kip Righter, MD  REFERRING PROVIDER: Jerilynn Daphne SAILOR, NP  REFERRING DIAG:  I61.1 (ICD-10-CM) - Nontraumatic cortical hemorrhage of left cerebral hemisphere [facility])    THERAPY DIAG:  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  States she got her nails done yesterday after her therapies and was wiped out.  Comes today without AD.  Evaluation: Works at [employer].  While at work she could not feel her right foot and had right leg pain. Went to the ED via ambulance.  Diagnosed with Brain bleed; after hospitalization went to CIR; discharged 03/03/2024; right leg feels heavy; right arm still has some numbness and tingling.    PERTINENT HISTORY: Migraine DM anxiety PAIN:  Are you having pain? No  PRECAUTIONS: Fall  RED FLAGS: None   WEIGHT BEARING RESTRICTIONS: No  FALLS:  Has patient fallen in last 6 months? No  OCCUPATION: works at [employer]  PLOF: Independent  PATIENT GOALS: walk better; better balance; be independent and go back to work  NEXT MD VISIT: PRN  OBJECTIVE:  Note: Objective measures were completed at Evaluation unless otherwise noted.  DIAGNOSTIC FINDINGS:   IMPRESSION: 1. 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 Stable mild regional mass effect without midline shift. 2. No new intracranial abnormality.   Electronically signed by: Helayne Hurst MD 03/01/2024 11:06 AM EST RP Workstation: [id number] PATIENT SURVEYS:  LEFS  Extreme difficulty/unable (0), Quite a bit of difficulty (1), Moderate difficulty (2), Little difficulty (3), No difficulty (4) Survey date:  evaluation  Score  total:  15/80; 18.8%     COGNITION: Overall cognitive status: Within functional limits for tasks assessed     SENSATION: Right arm altered sensation; right handed  EDEMA:    POSTURE: No Significant postural limitations; guarded head movement  PALPATION:   LOWER EXTREMITY ROM:  Active ROM Right eval Left eval  Hip flexion    Hip extension    Hip abduction    Hip adduction    Hip internal rotation    Hip external rotation    Knee flexion    Knee extension    Ankle dorsiflexion    Ankle plantarflexion    Ankle inversion    Ankle eversion     (Blank rows = not tested)  LOWER EXTREMITY MMT:  MMT Right eval Left eval  Hip flexion 4- 4+  Hip extension    Hip abduction    Hip adduction    Hip internal rotation    Hip external rotation    Knee flexion    Knee extension 4- 5  Ankle dorsiflexion 4- 5  Ankle plantarflexion    Ankle inversion    Ankle eversion     (Blank rows = not tested)  FUNCTIONAL TESTS:  5 times sit to stand: 30.57 sec using hands to push up to standing 2 minute walk test: 232 ft no AD occasionally with path deviation SLS 2 right; 2 left  GAIT: Distance walked: 232 ft without AD Assistive device utilized: None Level of assistance: SBA Comments: occasional misstep  DGI completed 03/10/2024 1. Gait level surface (1) Moderate Impairment: Walks 20', slow speed, abnormal gait pattern, evidence for imbalance. 2. Change in gait speed (1) Moderate Impairment: Makes only minor adjustments to walking speed, or accomplishes a change in speed with significant gait deviations, or changes speed but has significant gait deviations, or changes speed but loses balance but is able to recover and continue walking. 3. Gait with horizontal head turns (1) Moderate Impairment: Performs head turns with moderate change in gait velocity, slows down, staggers but recovers, can continue to walk. 4. Gait with vertical head turns 1) Moderate Impairment: Performs  head turns with moderate change in gait velocity, slows down, staggers but recovers, can continue to walk. 5. Gait and pivot turn (1) Moderate Impairment: Turns slowly, requires verbal cueing, requires several small steps to catch balance following turn and stop. 6. Step over obstacle (1) Moderate Impairment: Is able to step over box but must stop, then step over. May require verbal cueing. 7. Step around obstacles (1) Moderate Impairment: Is able to clear cones but must significantly slow, speed to accomplish task, or requires verbal cueing. 8. Stairs (1) Moderate Impairment: Two feet to a stair, must use rail.  TOTAL SCORE: 8 / 24  TREATMENT DATE:   03/10/2024 DGI:  see above for details, 8/24 Standing:  in // with bil UE assist heelraises on incline 20X  Toeraises on decline 20X  Tandem stance 30 each LE leading without UE assist  SLS each LE max of 3, 3 Rt, 10 Lt without UE assist  Marching with high hold 3 alternating 10X  Hip abduction 10X each LE  Hip extension 10X each LE  03/08/2024 physical therapy evaluation and HEP instruction    PATIENT EDUCATION:  Education details: Patient educated on exam findings, POC, scope of PT, HEP, and what to expect next visit. Person educated: Patient Education method: Explanation, Demonstration, and Handouts Education comprehension: verbalized understanding, returned demonstration, verbal cues required, and tactile cues required  HOME EXERCISE PROGRAM: Access Code: [email or web address] URL: [email or web address] Date: 03/08/2024 Prepared by: AP - Rehab Exercises - Sit to Stand with Armchair  - 2 x daily - 7 x weekly - 1 sets - 10 reps - Standing Single Leg Stance with Counter Support  - 2 x daily - 7 x weekly - 1 sets - 10 reps - 10 sec hold - Standing Marching  - 2 x daily - 7 x weekly - 1 sets - 10  reps  Access Code: [email or web address] URL: [email or web address] Date: 03/10/2024 Prepared by: Greig Fuse Exercises - Standing Hip Abduction  - 2 x daily - 7 x weekly - 10 reps - Standing Hip Extension  - 2 x daily - 7 x weekly - 10 reps - Tandem Stance with Support  - 2 x daily - 7 x weekly - 3 reps - 30 sec hold - Heel Raises with Counter Support  - 2 x daily - 7 x weekly - 10 reps - Toe Raises with Counter Support  - 2 x daily - 7 x weekly - 10 reps   ASSESSMENT:  CLINICAL IMPRESSION: DGI completed with moderate impairment noted with all activities (8/24).   Pt completes stairs with step to gait using Lt LE to step up and first to step down (despite cues for Rt leading step down).  Continued with LE strengthening completed in bars with bil UE assist. New exercises added to HEP and pt without questions or concerns.  Mild fatigue voiced at EOS.   Patient will benefit from skilled physical therapy services to address these deficits to improve level of function with ADLs, functional mobility tasks, and reduce risk for falls.     OBJECTIVE IMPAIRMENTS: Abnormal gait, decreased activity tolerance, decreased balance, decreased cognition, decreased coordination, decreased mobility, difficulty walking, decreased strength, and impaired perceived functional ability.   ACTIVITY LIMITATIONS: carrying, lifting, bending, sitting, standing, squatting, sleeping, stairs, transfers, bed mobility, dressing, hygiene/grooming, and locomotion level  PARTICIPATION LIMITATIONS: meal prep, cleaning, laundry, medication management, driving, shopping, and community activity  EHAB POTENTIAL: Good  CLINICAL DECISION MAKING: Evolving/moderate complexity  EVALUATION COMPLEXITY: Moderate   GOALS: Goals reviewed with patient? No  SHORT TERM GOALS: Target date: 03/29/2024 patient will be independent with initial HEP and compliant with HEP 3-4 times a week   Baseline: Goal status: INITIAL  2.  Patient  will report 50% improvement overall  Baseline:  Goal status: INITIAL  3.  Patient will able to stand SLS on each leg x 5 to demonstrate improved functional balance  Baseline:  Goal status: INITIAL   LONG TERM GOALS: Target date: 04/22/2024  Patient will be independent in self management strategies to improve quality of life and functional outcomes.  Baseline:  Goal status: INITIAL  2.  Patient will report 70% improvement overall  Baseline:  Goal status: INITIAL  3.  Patient will able to stand SLS on each leg x 10 to demonstrate improved functional balance Baseline:  Goal status: INITIAL  4.  Patient will improve LEFS score by  15 points to demonstrate improved perceived function  Baseline: 15/80; 18.8% Goal status: INITIAL  5.  Patient will increase distance on 2 MWT to 300 ft or more with RW to demonstrate improved speed and efficiency with household and community ambulation  Baseline: 232 ft Goal status: INITIAL   PLAN:  PT FREQUENCY: 2x/week  PT DURATION: 6 weeks  PLANNED INTERVENTIONS: [id number]- PT Re-evaluation, 97110-Therapeutic exercises, 97530- Therapeutic activity, 97112- Neuromuscular re-education, 97535- Self Care, [id number]- Manual therapy, [id number]- Gait training, [phone]- Orthotic Fit/training, [id number]- Canalith repositioning, [id number]- Aquatic Therapy, 97760- Splinting, 97597- Wound care (first 20 sq cm), 97598- Wound care (each additional 20 sq cm)Patient/Family education, Balance training, Stair training, Taping, Dry Needling, Joint mobilization, Joint manipulation, Spinal manipulation, Spinal mobilization, Scar mobilization, and DME instructions.   PLAN FOR NEXT SESSION: Pt seeing ST and has OT eval scheduled. Continue to progress lower extremity strengthening, gait and balance training.  11:31 AM, 03/10/24 Greig KATHEE Fuse, PTA/CLT [facility] Ph: [phone]  "

## 2024-03-14 ENCOUNTER — Ambulatory Visit (HOSPITAL_COMMUNITY): Admitting: Physical Therapy

## 2024-03-15 ENCOUNTER — Ambulatory Visit (HOSPITAL_COMMUNITY): Admitting: Speech Pathology

## 2024-03-16 ENCOUNTER — Encounter: Admitting: Registered Nurse

## 2024-03-17 ENCOUNTER — Ambulatory Visit (HOSPITAL_COMMUNITY): Admitting: Physical Therapy

## 2024-03-17 ENCOUNTER — Ambulatory Visit (HOSPITAL_COMMUNITY): Admitting: Occupational Therapy

## 2024-03-22 ENCOUNTER — Ambulatory Visit (HOSPITAL_COMMUNITY)

## 2024-03-22 ENCOUNTER — Ambulatory Visit (HOSPITAL_COMMUNITY): Admitting: Speech Pathology

## 2024-03-23 ENCOUNTER — Ambulatory Visit (HOSPITAL_COMMUNITY): Admitting: Occupational Therapy

## 2024-03-24 ENCOUNTER — Ambulatory Visit (HOSPITAL_COMMUNITY): Admitting: Speech Pathology

## 2024-03-24 ENCOUNTER — Ambulatory Visit (HOSPITAL_COMMUNITY)

## 2024-03-24 ENCOUNTER — Ambulatory Visit (HOSPITAL_COMMUNITY): Admitting: Occupational Therapy

## 2024-03-29 ENCOUNTER — Ambulatory Visit (HOSPITAL_COMMUNITY): Admitting: Occupational Therapy

## 2024-03-29 ENCOUNTER — Ambulatory Visit (HOSPITAL_COMMUNITY)

## 2024-03-30 ENCOUNTER — Inpatient Hospital Stay: Admitting: Adult Health

## 2024-03-31 ENCOUNTER — Ambulatory Visit (HOSPITAL_COMMUNITY): Admitting: Occupational Therapy

## 2024-03-31 ENCOUNTER — Ambulatory Visit (HOSPITAL_COMMUNITY)

## 2024-03-31 ENCOUNTER — Ambulatory Visit (HOSPITAL_COMMUNITY): Admitting: Speech Pathology

## 2024-04-04 ENCOUNTER — Ambulatory Visit (HOSPITAL_COMMUNITY): Admitting: Speech Pathology

## 2024-04-04 ENCOUNTER — Ambulatory Visit (HOSPITAL_COMMUNITY)

## 2024-04-04 ENCOUNTER — Ambulatory Visit (HOSPITAL_COMMUNITY): Admitting: Occupational Therapy

## 2024-04-07 ENCOUNTER — Ambulatory Visit (HOSPITAL_COMMUNITY)

## 2024-04-07 ENCOUNTER — Ambulatory Visit (HOSPITAL_COMMUNITY): Admitting: Occupational Therapy

## 2024-04-13 ENCOUNTER — Ambulatory Visit (HOSPITAL_COMMUNITY): Admitting: Occupational Therapy

## 2024-04-15 ENCOUNTER — Ambulatory Visit (HOSPITAL_COMMUNITY): Admitting: Occupational Therapy

## 2024-04-20 ENCOUNTER — Ambulatory Visit (HOSPITAL_COMMUNITY): Admitting: Occupational Therapy

## 2024-04-22 ENCOUNTER — Ambulatory Visit (HOSPITAL_COMMUNITY): Admitting: Occupational Therapy
